# Patient Record
Sex: Male | Born: 1935 | Race: White | Hispanic: No | Smoking: Never smoker
Health system: Southern US, Community
[De-identification: ages and names within clinical notes are randomized; demographics above are authoritative.]

## PROBLEM LIST (undated history)

## (undated) DIAGNOSIS — R0609 Other forms of dyspnea: Secondary | ICD-10-CM

## (undated) DIAGNOSIS — K621 Rectal polyp: Secondary | ICD-10-CM

## (undated) DIAGNOSIS — R739 Hyperglycemia, unspecified: Secondary | ICD-10-CM

## (undated) DIAGNOSIS — G4730 Sleep apnea, unspecified: Secondary | ICD-10-CM

## (undated) DIAGNOSIS — R350 Frequency of micturition: Secondary | ICD-10-CM

## (undated) DIAGNOSIS — R5383 Other fatigue: Secondary | ICD-10-CM

## (undated) DIAGNOSIS — R269 Unspecified abnormalities of gait and mobility: Secondary | ICD-10-CM

## (undated) DIAGNOSIS — G609 Hereditary and idiopathic neuropathy, unspecified: Secondary | ICD-10-CM

## (undated) DIAGNOSIS — R0989 Other specified symptoms and signs involving the circulatory and respiratory systems: Secondary | ICD-10-CM

## (undated) DIAGNOSIS — Z7901 Long term (current) use of anticoagulants: Secondary | ICD-10-CM

## (undated) DIAGNOSIS — L723 Sebaceous cyst: Secondary | ICD-10-CM

## (undated) DIAGNOSIS — K589 Irritable bowel syndrome without diarrhea: Secondary | ICD-10-CM

## (undated) DIAGNOSIS — R011 Cardiac murmur, unspecified: Secondary | ICD-10-CM

## (undated) DIAGNOSIS — G252 Other specified forms of tremor: Secondary | ICD-10-CM

## (undated) DIAGNOSIS — K449 Diaphragmatic hernia without obstruction or gangrene: Secondary | ICD-10-CM

## (undated) DIAGNOSIS — R197 Diarrhea, unspecified: Secondary | ICD-10-CM

## (undated) DIAGNOSIS — K5900 Constipation, unspecified: Secondary | ICD-10-CM

## (undated) DIAGNOSIS — R002 Palpitations: Secondary | ICD-10-CM

## (undated) DIAGNOSIS — R5381 Other malaise: Secondary | ICD-10-CM

## (undated) DIAGNOSIS — N400 Enlarged prostate without lower urinary tract symptoms: Secondary | ICD-10-CM

## (undated) DIAGNOSIS — R7989 Other specified abnormal findings of blood chemistry: Secondary | ICD-10-CM

## (undated) DIAGNOSIS — I10 Essential (primary) hypertension: Secondary | ICD-10-CM

## (undated) DIAGNOSIS — G8100 Flaccid hemiplegia affecting unspecified side: Secondary | ICD-10-CM

## (undated) DIAGNOSIS — G459 Transient cerebral ischemic attack, unspecified: Secondary | ICD-10-CM

## (undated) DIAGNOSIS — R609 Edema, unspecified: Secondary | ICD-10-CM

## (undated) DIAGNOSIS — K210 Gastro-esophageal reflux disease with esophagitis: Secondary | ICD-10-CM

## (undated) DIAGNOSIS — R42 Dizziness and giddiness: Secondary | ICD-10-CM

## (undated) DIAGNOSIS — M25569 Pain in unspecified knee: Secondary | ICD-10-CM

## (undated) DIAGNOSIS — G250 Essential tremor: Secondary | ICD-10-CM

## (undated) DIAGNOSIS — G44209 Tension-type headache, unspecified, not intractable: Secondary | ICD-10-CM

## (undated) DIAGNOSIS — G4700 Insomnia, unspecified: Secondary | ICD-10-CM

## (undated) DIAGNOSIS — E538 Deficiency of other specified B group vitamins: Secondary | ICD-10-CM

## (undated) DIAGNOSIS — K620 Anal polyp: Secondary | ICD-10-CM

## (undated) DIAGNOSIS — J342 Deviated nasal septum: Secondary | ICD-10-CM

## (undated) DIAGNOSIS — N183 Chronic kidney disease, stage 3 (moderate): Principal | ICD-10-CM

## (undated) DIAGNOSIS — I4891 Unspecified atrial fibrillation: Secondary | ICD-10-CM

## (undated) HISTORY — DX: Sleep apnea, unspecified: G47.30

## (undated) HISTORY — DX: Pain in unspecified knee: M25.569

## (undated) HISTORY — DX: Rectal polyp: K62.1

## (undated) HISTORY — DX: Hereditary and idiopathic neuropathy, unspecified: G60.9

## (undated) HISTORY — DX: Benign prostatic hyperplasia without lower urinary tract symptoms: N40.0

## (undated) HISTORY — DX: Frequency of micturition: R35.0

## (undated) HISTORY — DX: Essential tremor: G25.0

## (undated) HISTORY — DX: Transient cerebral ischemic attack, unspecified: G45.9

## (undated) HISTORY — DX: Chronic kidney disease, stage 3 (moderate): N18.3

## (undated) HISTORY — DX: Gastro-esophageal reflux disease with esophagitis: K21.0

## (undated) HISTORY — DX: Other specified forms of tremor: G25.2

## (undated) HISTORY — DX: Other forms of dyspnea: R06.09

## (undated) HISTORY — DX: Long term (current) use of anticoagulants: Z79.01

## (undated) HISTORY — DX: Other specified symptoms and signs involving the circulatory and respiratory systems: R09.89

## (undated) HISTORY — DX: Deviated nasal septum: J34.2

## (undated) HISTORY — DX: Other fatigue: R53.83

## (undated) HISTORY — DX: Insomnia, unspecified: G47.00

## (undated) HISTORY — DX: Sebaceous cyst: L72.3

## (undated) HISTORY — DX: Anal polyp: K62.0

## (undated) HISTORY — DX: Dizziness and giddiness: R42

## (undated) HISTORY — DX: Other malaise: R53.81

## (undated) HISTORY — DX: Irritable bowel syndrome without diarrhea: K58.9

## (undated) HISTORY — DX: Other specified abnormal findings of blood chemistry: R79.89

## (undated) HISTORY — DX: Deficiency of other specified B group vitamins: E53.8

## (undated) HISTORY — DX: Unspecified atrial fibrillation: I48.91

## (undated) HISTORY — DX: Edema, unspecified: R60.9

## (undated) HISTORY — DX: Constipation, unspecified: K59.00

## (undated) HISTORY — DX: Palpitations: R00.2

## (undated) HISTORY — DX: Cardiac murmur, unspecified: R01.1

## (undated) HISTORY — DX: Unspecified abnormalities of gait and mobility: R26.9

## (undated) HISTORY — DX: Flaccid hemiplegia affecting unspecified side: G81.00

## (undated) HISTORY — DX: Diarrhea, unspecified: R19.7

## (undated) HISTORY — DX: Essential (primary) hypertension: I10

## (undated) HISTORY — DX: Diaphragmatic hernia without obstruction or gangrene: K44.9

## (undated) HISTORY — DX: Hyperglycemia, unspecified: R73.9

## (undated) HISTORY — DX: Tension-type headache, unspecified, not intractable: G44.209

---

## 1978-09-05 DIAGNOSIS — K620 Anal polyp: Secondary | ICD-10-CM

## 1978-09-05 HISTORY — DX: Anal polyp: K62.0

## 1999-06-30 ENCOUNTER — Encounter: Payer: Self-pay | Admitting: Gastroenterology

## 1999-06-30 ENCOUNTER — Ambulatory Visit (HOSPITAL_COMMUNITY): Admission: RE | Admit: 1999-06-30 | Discharge: 1999-06-30 | Payer: Self-pay | Admitting: Gastroenterology

## 2000-09-05 DIAGNOSIS — G459 Transient cerebral ischemic attack, unspecified: Secondary | ICD-10-CM

## 2000-09-05 DIAGNOSIS — R011 Cardiac murmur, unspecified: Secondary | ICD-10-CM

## 2000-09-05 DIAGNOSIS — N400 Enlarged prostate without lower urinary tract symptoms: Secondary | ICD-10-CM

## 2000-09-05 HISTORY — DX: Cardiac murmur, unspecified: R01.1

## 2000-09-05 HISTORY — DX: Benign prostatic hyperplasia without lower urinary tract symptoms: N40.0

## 2000-09-05 HISTORY — DX: Transient cerebral ischemic attack, unspecified: G45.9

## 2001-03-17 ENCOUNTER — Encounter: Payer: Self-pay | Admitting: Internal Medicine

## 2001-03-17 ENCOUNTER — Inpatient Hospital Stay (HOSPITAL_COMMUNITY): Admission: EM | Admit: 2001-03-17 | Discharge: 2001-03-19 | Payer: Self-pay | Admitting: Emergency Medicine

## 2001-03-18 ENCOUNTER — Encounter: Payer: Self-pay | Admitting: Internal Medicine

## 2001-03-27 ENCOUNTER — Ambulatory Visit: Admission: RE | Admit: 2001-03-27 | Discharge: 2001-03-27 | Payer: Self-pay | Admitting: Internal Medicine

## 2004-07-08 ENCOUNTER — Ambulatory Visit: Payer: Self-pay | Admitting: Internal Medicine

## 2004-10-22 ENCOUNTER — Ambulatory Visit: Payer: Self-pay | Admitting: Internal Medicine

## 2004-10-26 ENCOUNTER — Encounter: Admission: RE | Admit: 2004-10-26 | Discharge: 2004-10-26 | Payer: Self-pay | Admitting: Internal Medicine

## 2004-11-02 ENCOUNTER — Ambulatory Visit: Payer: Self-pay | Admitting: Internal Medicine

## 2004-11-09 ENCOUNTER — Inpatient Hospital Stay (HOSPITAL_COMMUNITY): Admission: AD | Admit: 2004-11-09 | Discharge: 2004-11-10 | Payer: Self-pay | Admitting: Internal Medicine

## 2004-11-09 ENCOUNTER — Ambulatory Visit: Payer: Self-pay | Admitting: Internal Medicine

## 2004-11-15 ENCOUNTER — Ambulatory Visit: Payer: Self-pay | Admitting: Internal Medicine

## 2004-12-14 ENCOUNTER — Ambulatory Visit: Payer: Self-pay | Admitting: Internal Medicine

## 2004-12-27 ENCOUNTER — Ambulatory Visit: Payer: Self-pay | Admitting: Internal Medicine

## 2005-05-17 ENCOUNTER — Ambulatory Visit: Payer: Self-pay | Admitting: Gastroenterology

## 2005-08-22 ENCOUNTER — Ambulatory Visit: Payer: Self-pay | Admitting: Internal Medicine

## 2005-09-05 DIAGNOSIS — G252 Other specified forms of tremor: Secondary | ICD-10-CM

## 2005-09-05 DIAGNOSIS — I10 Essential (primary) hypertension: Secondary | ICD-10-CM

## 2005-09-05 DIAGNOSIS — K449 Diaphragmatic hernia without obstruction or gangrene: Secondary | ICD-10-CM

## 2005-09-05 DIAGNOSIS — R5381 Other malaise: Secondary | ICD-10-CM

## 2005-09-05 DIAGNOSIS — G250 Essential tremor: Secondary | ICD-10-CM

## 2005-09-05 DIAGNOSIS — R002 Palpitations: Secondary | ICD-10-CM

## 2005-09-05 DIAGNOSIS — K2100 Gastro-esophageal reflux disease with esophagitis, without bleeding: Secondary | ICD-10-CM

## 2005-09-05 HISTORY — DX: Other malaise: R53.81

## 2005-09-05 HISTORY — DX: Palpitations: R00.2

## 2005-09-05 HISTORY — DX: Essential tremor: G25.0

## 2005-09-05 HISTORY — DX: Diaphragmatic hernia without obstruction or gangrene: K44.9

## 2005-09-05 HISTORY — DX: Essential (primary) hypertension: I10

## 2005-09-05 HISTORY — DX: Other specified forms of tremor: G25.2

## 2005-09-05 HISTORY — DX: Gastro-esophageal reflux disease with esophagitis, without bleeding: K21.00

## 2006-09-05 DIAGNOSIS — L723 Sebaceous cyst: Secondary | ICD-10-CM

## 2006-09-05 DIAGNOSIS — E538 Deficiency of other specified B group vitamins: Secondary | ICD-10-CM

## 2006-09-05 HISTORY — DX: Deficiency of other specified B group vitamins: E53.8

## 2006-09-05 HISTORY — DX: Sebaceous cyst: L72.3

## 2007-07-30 ENCOUNTER — Encounter (INDEPENDENT_AMBULATORY_CARE_PROVIDER_SITE_OTHER): Payer: Self-pay | Admitting: General Surgery

## 2007-07-30 ENCOUNTER — Ambulatory Visit (HOSPITAL_COMMUNITY): Admission: RE | Admit: 2007-07-30 | Discharge: 2007-07-30 | Payer: Self-pay | Admitting: General Surgery

## 2007-09-06 DIAGNOSIS — G44209 Tension-type headache, unspecified, not intractable: Secondary | ICD-10-CM

## 2007-09-06 DIAGNOSIS — R42 Dizziness and giddiness: Secondary | ICD-10-CM

## 2007-09-06 HISTORY — DX: Dizziness and giddiness: R42

## 2007-09-06 HISTORY — DX: Tension-type headache, unspecified, not intractable: G44.209

## 2008-09-05 DIAGNOSIS — R609 Edema, unspecified: Secondary | ICD-10-CM

## 2008-09-05 HISTORY — DX: Edema, unspecified: R60.9

## 2009-02-09 DIAGNOSIS — R7989 Other specified abnormal findings of blood chemistry: Secondary | ICD-10-CM

## 2009-02-09 HISTORY — DX: Other specified abnormal findings of blood chemistry: R79.89

## 2009-11-03 DIAGNOSIS — R0609 Other forms of dyspnea: Secondary | ICD-10-CM

## 2009-11-03 DIAGNOSIS — I4891 Unspecified atrial fibrillation: Secondary | ICD-10-CM

## 2009-11-03 DIAGNOSIS — J342 Deviated nasal septum: Secondary | ICD-10-CM

## 2009-11-03 DIAGNOSIS — R0989 Other specified symptoms and signs involving the circulatory and respiratory systems: Secondary | ICD-10-CM

## 2009-11-03 DIAGNOSIS — Z7901 Long term (current) use of anticoagulants: Secondary | ICD-10-CM

## 2009-11-03 HISTORY — DX: Deviated nasal septum: J34.2

## 2009-11-03 HISTORY — DX: Long term (current) use of anticoagulants: Z79.01

## 2009-11-03 HISTORY — DX: Unspecified atrial fibrillation: I48.91

## 2009-11-03 HISTORY — DX: Other specified symptoms and signs involving the circulatory and respiratory systems: R06.09

## 2009-11-03 HISTORY — DX: Other specified symptoms and signs involving the circulatory and respiratory systems: R09.89

## 2010-02-03 DIAGNOSIS — G4700 Insomnia, unspecified: Secondary | ICD-10-CM

## 2010-02-03 DIAGNOSIS — G609 Hereditary and idiopathic neuropathy, unspecified: Secondary | ICD-10-CM

## 2010-02-03 DIAGNOSIS — R197 Diarrhea, unspecified: Secondary | ICD-10-CM

## 2010-02-03 DIAGNOSIS — M25569 Pain in unspecified knee: Secondary | ICD-10-CM

## 2010-02-03 DIAGNOSIS — R269 Unspecified abnormalities of gait and mobility: Secondary | ICD-10-CM

## 2010-02-03 HISTORY — DX: Unspecified abnormalities of gait and mobility: R26.9

## 2010-02-03 HISTORY — DX: Pain in unspecified knee: M25.569

## 2010-02-03 HISTORY — DX: Hereditary and idiopathic neuropathy, unspecified: G60.9

## 2010-02-03 HISTORY — DX: Insomnia, unspecified: G47.00

## 2010-02-03 HISTORY — DX: Diarrhea, unspecified: R19.7

## 2010-05-26 ENCOUNTER — Encounter: Admission: RE | Admit: 2010-05-26 | Discharge: 2010-05-26 | Payer: Self-pay | Admitting: Gastroenterology

## 2010-12-08 ENCOUNTER — Ambulatory Visit
Admission: RE | Admit: 2010-12-08 | Discharge: 2010-12-08 | Disposition: A | Discharge status: Home / Self Care | Payer: Medicare Other | Source: Ambulatory Visit | Attending: Internal Medicine | Admitting: Internal Medicine

## 2010-12-08 ENCOUNTER — Other Ambulatory Visit: Payer: Self-pay | Admitting: Internal Medicine

## 2010-12-08 DIAGNOSIS — R0989 Other specified symptoms and signs involving the circulatory and respiratory systems: Secondary | ICD-10-CM

## 2010-12-08 DIAGNOSIS — R05 Cough: Secondary | ICD-10-CM

## 2011-01-18 NOTE — Op Note (Signed)
NAME:  KEYVIN, EAVEY NO.:  [id number]   MEDICAL RECORD NO.:  [id number]          PATIENT TYPE:  AMB   LOCATION:  SDS                          FACILITY:  [facility]   PHYSICIAN:  Merri Ray. Grandville Silos, M.D.DATE OF BIRTH:  1916/06/20   DATE OF PROCEDURE:  07/30/2007  DATE OF DISCHARGE:                               OPERATIVE REPORT   PREOPERATIVE DIAGNOSIS:  Cyst, left axilla.   POSTOPERATIVE DIAGNOSIS:  Cyst, left axilla.   PROCEDURES:  Excision of cyst in left axilla.   SURGEON:  Merri Ray. Grandville Silos, M.D.   ANESTHESIA:  MAC with local.   HISTORY OF PRESENT ILLNESS:  Mr. Amjad is an 77 year old gentleman who I  evaluated in the office for a cyst in his left axilla.  He developed an  infection there recently, and was treated with antibiotics by Dr. Nyoka Cowden.  He has had some waxy discharge, but that has improved.  It had shrunk  significantly in size.  He presents today for elective excision, to  prevent recurrence.  This is likely an epidermal inclusion cyst.   PROCEDURE IN DETAIL:  Informed consent was obtained.  The patient was  identified in the preoperative  holding area.  His site was marked.  He  received intravenous antibiotics.  He was brought to the operating room  and MAC anesthesia was administered by the anesthesia staff.  His left  axilla was prepped and draped in sterile fashion, with a one-to-one  mixture of 1% lidocaine plain and 0.25% Marcaine with epinephrine  injected around the cyst area.  An elliptical incision was made to  encompass the overlying skin sinus.  Subcutaneous tissues were dissected down the cyst itself; which was  circumferentially dissected without rupturing it.  Bovie cautery was  used for hemostasis.  The cyst was removed with the overlying skin and  sent to pathology.  The wound was copiously irrigated.  Meticulous  hemostasis was ensured.  Some additional local anesthetic was injected.  Subcutaneous tissues were then approximated  with interrupted 3-0 Vicryl  sutures and the skin was closed with running 4-0 Monocryl subcuticular  stitch.  Sponge, needle and instrument counts were correct.  Benzoin,  Steri-Strips and sterile dressings were applied.  The patient tolerated  the procedure well without apparent complication.  He was taken to the  recovery room in stable condition.      Merri Ray Grandville Silos, M.D.  Electronically Signed     BET/MEDQ  D:  07/30/2007  T:  07/30/2007  Job:  [id number]   cc:   Viviann Spare. Nyoka Cowden, M.D.

## 2011-01-21 NOTE — H&P (Signed)
Gary [name], Gary [name] NO.:  [id number]   MEDICAL RECORD NO.:  [id number]          PATIENT TYPE:  INP   LOCATION:  6732                         FACILITY:  [facility]   PHYSICIAN:  Gary Branch, MD [facility]    DATE OF BIRTH:  1915-11-24   DATE OF ADMISSION:  11/09/2004  DATE OF DISCHARGE:                                HISTORY & PHYSICAL   CHIEF COMPLAINT:  Felling worse.   PRIMARY CARE DOCTOR:  Lenis Mohl. Linna Darner, M.D.   HISTORY OF PRESENT ILLNESS:  [name] is an 77 year old gentleman who was  seen in the office nine days ago.  At that time, he complained of cough,  chest congestion and some shortness of breath.  He was found to have a lot  of rhonchi and wheezes in his chest and diagnosed with bronchitis and  bronchospasm.  He was prescribed Z-Pak, Mucinex and Hycotuss, as well as  albuterol.  He came today, stating that he initially got better, but now  feels worse.  He is coughing more.  He has more chest congestion.  He has  dyspnea on exertion.  He is feeling weak and he is shaking on his hands.  At the office, the x-ray showed possibly pneumonia and he has been admitted  for further care.   PAST MEDICAL HISTORY:  1.  BPH.  2.  History of TIA x 2 in the past.  3.  History of cholecystectomy.  4.  Hypertension.  5.  GERD.  6.  Osteoarthritis.   FAMILY HISTORY:  Father had diabetes.  Mother died at age 68 of old age.   SOCIAL HISTORY:  The patient has never smoked.  He drinks occasionally.  He  is a retired Chief Financial Officer.   REVIEW OF SYSTEMS:  He thinks he is running a low-grade fever.  He does not  have an appetite at all.  He does not complain of chest pain.  He has very  little sputum production.  He was prescribed Hycotuss during the last visit,  which helped with the cough, but it did cause him to have some difficulty  urinating one time.  He did not develop any overt bladder obstruction.  The  patient also has chronic headaches and he has been referred to  neurology  already as an outpatient.   MEDICATIONS:  1.  Darvocet p.r.n. headache.  2.  Neurontin 100 mg one p.o. t.i.d.  3.  Aciphex.  4.  Vasotec 5 mg p.o. daily.  5.  Flomax once a day.  6.  Aspirin.   ALLERGIES:  The patient has no known drug allergies.   PHYSICAL EXAMINATION:  GENERAL APPEARANCE:  The patient is alert, orient and  in no apparent distress.  VITAL SIGNS:  He is afebrile.  Pulse 64, blood pressure 140/70.  WEIGHT:  203 pounds.  HEENT:  The oropharynx is normal.  The nose is clear.  The sinuses are  nontender.  LUNGS:  He does have some rhonchi with cough and decreased breath sounds at  the bases.  He does not have any overt respiratory distress.  His O2  saturation on room air is 92%.  CARDIOVASCULAR:  Regular rate and rhythm.  He does have a soft systolic  murmur which was noticed previously.  EXTREMITIES:  No edema.  NEUROLOGIC:  Speech, gait and motor are intact.   LABORATORY AND X-RAYS:  Chest x-ray done in the office showed possible  infiltrates at both bases.  I do not have an old chest x-ray to compare  with, so it is hard to determine if he truly has an infiltrate.  His blood  sugar at the office was 131.  Again, his pulse oximetry was 92%.   ASSESSMENT AND PLAN:  [name] is an 77 year old gentleman who is not  responding well to outpatient treatment of his respiratory illness.  He may  be developing pneumonia.  Because of his age, I think it is in his best  interest to be admitted to the hospital where he can be closely monitored.  I will start him on IV Avelox after blood cultures are done.  Will continue  with his routine medicines, as well as Xopenex nebulizations t.i.d.  Will do  DVT prophylaxis with Lovenox.  The films from today were given to the  patient so they can be read at the hospital by one of our radiologists.      JEP/MEDQ  D:  11/09/2004  T:  11/09/2004  Job:  [id number]

## 2011-01-21 NOTE — Discharge Summary (Signed)
[facility]  Patient:    Gary [name], Gary [name]                      MRN: [id number] Adm. Date:  [id number] Disc. Date: [id number] Attending:  Cassandria Anger CC:         Darrick Penna. Linna Darner, M.D. [facility]   Discharge Summary  DISCHARGE DIAGNOSES: 1. Transient ischemic attack like symptoms which have resolved. 2. Heart murmur which according to the patient is not new.  It sounds like    diastolic murmur on auscultation.  The patient is to have a 2-D    echocardiogram done as an outpatient. 3. History of hypertension.  There has been no change in his medications, and    he is to continue on them. 4. History of gastroesophageal reflux disease. 5. Dyslipidemia, briefly discussed exercise and cooking with olive oil and    trying to raise the HDL which the patient says they already do.  Therefore,    I recommended that he discuss potential use of one of the lipid medications    for raising the HDL with his primary care physician.  DISCHARGE MEDICATIONS: 1. Prilosec 20 mg p.o. q.d. 2. Enalapril 10 mg p.o. q.d. 3. Celebrex 100 mg p.o. twice a day p.r.n. 4. Plavix 75 mg p.o. q.d. 5. Baby aspirin 81 mg p.o. q.d. 6. The patient is also currently on ginkgo biloba study, and he is either    taking the active or the placebo form.  DISCHARGE DIET:  Low salt diet.  ACTIVITY:  As tolerated.  DISCHARGE FOLLOW-UP:  He is to set up a follow-up appointment with Dr. Jodi Mourning in approximately two weeks.  DISCHARGE INSTRUCTIONS:  The patient is to have a carotid ultrasound and 2-D echocardiogram done as an outpatient at the [facility] office.  HOSPITAL COURSE:  This 77 year old gentleman with a previous history of TIA like symptoms approximately 25 years ago and a history of hypertension was admitted on the 13th by Dr. Alain Marion with complaints of acute memory loss that happened twice early on the day of admission.  The patient denies any other symptoms.   At that time, this episode of ______  which lasted about 2-3 hours and had technically resolved by the time of his presentation to the ER. He did have a slight headache, 2/10 in intensity on the left side over his temple.  the patient had no slurred speech, no bowel or bladder incontinence, no weakness.  The patients initial work-up consisted of a CT scan of the head which was technically negative.  His laboratory work-up was also essentially unremarkable.  He had a TSH done that was 1.82.  Vitamin B12 was in the normal range at 301.  His sed rate was 5.  His white count was also normal with an H&H of 14 and 41.1 with a total white count of 5.2, platelet count of 167,000. Electrolytes consisting of a BMP was also fairly unremarkable.  He also had a fasting lipid panel which came back fairly good except for an HDL of 37, total cholesterol of 131, triglyceride 91, LDL 76.  He underwent an MRI and MRA of the brain, and that was essentially unremarkable except for an empty sellar. He had no acute ischemic changes.  He had good flow voids in the major vessels at the cranial skull base also.  So, technically, it was a fairly unremarkable MRI except for diffuse atrophy and some mild small vessel  type disease changes supratentorialy.  He did have some suggestion of mastoiditis and mild sinusitis.  The MRA portion of the brain showed no occlusion, stenosis, dissections, or aneurysms.  The patient is being discharged to home in stable condition.  He is to have his scheduled outpatient studies as previously mentioned and also follow up with Dr. Linna Darner, as mentioned above. DD:  03/19/01 TD:  03/19/01 Job: [id number]

## 2011-01-21 NOTE — Discharge Summary (Signed)
NAMEJEN, CADOTTE NO.:  [id number]   MEDICAL RECORD NO.:  [id number]          PATIENT TYPE:  INP   LOCATION:  6732                         FACILITY:  [facility]   PHYSICIAN:  Colon Branch, MD [facility]    DATE OF BIRTH:  1916/01/16   DATE OF ADMISSION:  11/09/2004  DATE OF DISCHARGE:  11/10/2004                                 DISCHARGE SUMMARY   DISCHARGE DIAGNOSES:  1.  Cough.  2.  Possible bilateral pneumonia.   HISTORY OF PRESENT ILLNESS:  Mr. Stockham is an 77 year old white male who  presented with cough.  He states that he has had this cough for a while and  it got better initially and now it seems to be getting worse.  He describes  mostly cough at night which is nonproductive and keeps him awake.   PAST MEDICAL HISTORY:  1.  History of prostatism.  2.  Transient ischemic attack x2.  3.  Gallbladder disease.  4.  Hypertension.  5.  BPH.   HOSPITAL COURSE:  Problem 1.  ID.  The patient was admitted with a  questionable bilateral pneumonia on office chest x-ray.  The patient is  afebrile and his white count is normal.  The patient has been started on  Avelox and will have him complete a full 10-day course.  A follow-up chest x-  ray is still pending at the time of this dictation.   Problem 2.  Pulmonary.  The patient presented with some dyspnea, but no  hypoxemia.  No history of tobacco use or COPD.  Currently saturations are  95% on room air.  He does possibly have bilateral pneumonia which may be the  etiology of his shortness of breath.   DISCHARGE LABORATORY DATA:  CBC, CMET, coags, and blood cultures were all  negative.   DISCHARGE MEDICATIONS:  We will have him resume his home medications  including:  1.  Neurontin 100 mg t.i.d.  2.  Aciphex 20 mg daily.  3.  Vasotec 5 mg daily.  4.  Flomax 0.4 mg daily.  5.  Aspirin 81 mg daily.  6.  Avelox 400 mg last dose November 17, 2004.  7.  Tussionex 5 mg q.h.s. p.r.n. cough.   FOLLOW UP:  Follow up with  Dr. Larose Kells on November 15, 2004, at 11:30 a.m.      LC/MEDQ  D:  11/10/2004  T:  11/10/2004  Job:  [id number]

## 2011-01-21 NOTE — H&P (Signed)
[facility]  Patient:    Gary [name], Gary [name]                      MRN: [id number] Adm. Date:  [id number] Attending:  Cassandria Anger CC:         Darrick Penna. Linna Darner, M.D. [facility]   History and Physical  DATE OF BIRTH:  [birth year]  CHIEF COMPLAINT:  Memory loss.  HISTORY OF PRESENT ILLNESS:  The patient is an 77 year old white male with acute memory loss that happened twice earlier today.  The episodes were lasting for about 2-3 hours and resolved by now.  He also had a slight headache, 2/10 in intensity.  It was localized more on the left side over the temple.  He presented to the ER for evaluation.  PAST MEDICAL HISTORY:  TIA 25 years ago, hypertension, cholecystectomy, GERD, bilateral knee osteoarthritis.  MEDICATIONS: 1. Prilosec 20 mg a day. 2. Enalapril 10 mg a day. 3. Celebrex 100 mg one twice a day p.r.n. 4. Tylenol p.r.n., received aspirin in the ER. 5. He is also presently on ginkgo biloba study, taking either active herb or    placebo.  SOCIAL HISTORY:  He is married.  Never smokes, drinks one scotch a day.  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  FAMILY HISTORY:  Mother died at age 61 of old age, father died at the age of 61 with complications of diabetes.  ALLERGIES:  No known drug allergies.  REVIEW OF SYSTEMS:  Had normal cholesterol, denies syncope, chest pain.  No previous episodes except for 25 years ago, checked Hemoccult stools a couple of months ago which were normal, the rest is negative.  PHYSICAL EXAMINATION:  Blood pressure 168/67, pulse 58, respirations 18, temperature 96.9.  He is in no acute distress.  Alert and oriented and cooperative.  HEENT:  Moist mucosa.  NECK:  Supple.  There was a right-sided carotid bruit, no thyromegaly.  LUNGS:  Clear to auscultation and percussion.  HEART:  S1, S2, no murmur, no gallop.  ABDOMEN:  Soft, nontender, no organomegaly, no mass felt.  EXTREMITY:  Lower  extremities without edema.  There is no pronator drift.  SKIN:  Multiple seborrheic keratosis lesions.  NEUROLOGIC:  Cranial nerves II-XII nonfocal.  Muscle strength normal.  LABORATORY DATA:  CBG 112, EKG normal sinus rhythm with first degree AV block. BMET normal, CBC normal, CT of the brain negative.  ASSESSMENT AND PLAN: 1. Transient ischemic attack.  Initial workup including MRI with MRA.    Cardiac, echocardiogram and lab work.  Started aspirin, Plavix, consider    statins, vitamin B complex IV fluids. 2. Right carotid bruit.  Obtained carotid Doppler ultrasound. 3. Memory loss due to problem #1. 4. Hypertension, continue treatment. 5. Gastroesophageal reflux disease. DD:  03/17/01 TD:  03/18/01 Job: [id number]

## 2011-05-16 DIAGNOSIS — G8100 Flaccid hemiplegia affecting unspecified side: Secondary | ICD-10-CM

## 2011-05-16 HISTORY — DX: Flaccid hemiplegia affecting unspecified side: G81.00

## 2011-06-14 LAB — BASIC METABOLIC PANEL
CO2: 30
Chloride: 107
GFR calc Af Amer: >60
Sodium: 143

## 2011-06-14 LAB — CBC
Hemoglobin: 13.2
MCHC: 34.2
MCV: 92.4
RBC: 4.17 — ABNORMAL LOW

## 2011-09-28 DIAGNOSIS — I4891 Unspecified atrial fibrillation: Secondary | ICD-10-CM | POA: Diagnosis not present

## 2011-09-28 DIAGNOSIS — Z7901 Long term (current) use of anticoagulants: Secondary | ICD-10-CM | POA: Diagnosis not present

## 2011-11-03 DIAGNOSIS — I4891 Unspecified atrial fibrillation: Secondary | ICD-10-CM | POA: Diagnosis not present

## 2011-11-03 DIAGNOSIS — R7989 Other specified abnormal findings of blood chemistry: Secondary | ICD-10-CM | POA: Diagnosis not present

## 2011-11-03 DIAGNOSIS — R609 Edema, unspecified: Secondary | ICD-10-CM | POA: Diagnosis not present

## 2011-11-03 DIAGNOSIS — R5381 Other malaise: Secondary | ICD-10-CM | POA: Diagnosis not present

## 2011-11-03 DIAGNOSIS — I10 Essential (primary) hypertension: Secondary | ICD-10-CM | POA: Diagnosis not present

## 2011-11-14 DIAGNOSIS — R7989 Other specified abnormal findings of blood chemistry: Secondary | ICD-10-CM | POA: Diagnosis not present

## 2011-11-14 DIAGNOSIS — K589 Irritable bowel syndrome without diarrhea: Secondary | ICD-10-CM

## 2011-11-14 DIAGNOSIS — I4891 Unspecified atrial fibrillation: Secondary | ICD-10-CM | POA: Diagnosis not present

## 2011-11-14 DIAGNOSIS — R5381 Other malaise: Secondary | ICD-10-CM | POA: Diagnosis not present

## 2011-11-14 DIAGNOSIS — E538 Deficiency of other specified B group vitamins: Secondary | ICD-10-CM | POA: Diagnosis not present

## 2011-11-14 DIAGNOSIS — I10 Essential (primary) hypertension: Secondary | ICD-10-CM | POA: Diagnosis not present

## 2011-11-14 HISTORY — DX: Irritable bowel syndrome, unspecified: K58.9

## 2011-12-14 DIAGNOSIS — E538 Deficiency of other specified B group vitamins: Secondary | ICD-10-CM | POA: Diagnosis not present

## 2011-12-14 DIAGNOSIS — Z7901 Long term (current) use of anticoagulants: Secondary | ICD-10-CM | POA: Diagnosis not present

## 2012-01-25 DIAGNOSIS — Z7901 Long term (current) use of anticoagulants: Secondary | ICD-10-CM | POA: Diagnosis not present

## 2012-01-25 DIAGNOSIS — I4891 Unspecified atrial fibrillation: Secondary | ICD-10-CM | POA: Diagnosis not present

## 2012-02-29 DIAGNOSIS — I4891 Unspecified atrial fibrillation: Secondary | ICD-10-CM | POA: Diagnosis not present

## 2012-02-29 DIAGNOSIS — Z7901 Long term (current) use of anticoagulants: Secondary | ICD-10-CM | POA: Diagnosis not present

## 2012-04-10 DIAGNOSIS — L723 Sebaceous cyst: Secondary | ICD-10-CM | POA: Diagnosis not present

## 2012-04-11 DIAGNOSIS — Z7901 Long term (current) use of anticoagulants: Secondary | ICD-10-CM | POA: Diagnosis not present

## 2012-04-11 DIAGNOSIS — L723 Sebaceous cyst: Secondary | ICD-10-CM | POA: Diagnosis not present

## 2012-04-11 DIAGNOSIS — I4891 Unspecified atrial fibrillation: Secondary | ICD-10-CM | POA: Diagnosis not present

## 2012-05-02 DIAGNOSIS — I4891 Unspecified atrial fibrillation: Secondary | ICD-10-CM | POA: Diagnosis not present

## 2012-05-02 DIAGNOSIS — Z7901 Long term (current) use of anticoagulants: Secondary | ICD-10-CM | POA: Diagnosis not present

## 2012-05-22 DIAGNOSIS — R7989 Other specified abnormal findings of blood chemistry: Secondary | ICD-10-CM | POA: Diagnosis not present

## 2012-05-22 DIAGNOSIS — I4891 Unspecified atrial fibrillation: Secondary | ICD-10-CM | POA: Diagnosis not present

## 2012-05-22 DIAGNOSIS — E538 Deficiency of other specified B group vitamins: Secondary | ICD-10-CM | POA: Diagnosis not present

## 2012-05-22 DIAGNOSIS — I10 Essential (primary) hypertension: Secondary | ICD-10-CM | POA: Diagnosis not present

## 2012-06-04 DIAGNOSIS — R197 Diarrhea, unspecified: Secondary | ICD-10-CM | POA: Diagnosis not present

## 2012-06-04 DIAGNOSIS — R7989 Other specified abnormal findings of blood chemistry: Secondary | ICD-10-CM | POA: Diagnosis not present

## 2012-06-04 DIAGNOSIS — N400 Enlarged prostate without lower urinary tract symptoms: Secondary | ICD-10-CM | POA: Diagnosis not present

## 2012-06-04 DIAGNOSIS — R011 Cardiac murmur, unspecified: Secondary | ICD-10-CM | POA: Diagnosis not present

## 2012-06-04 DIAGNOSIS — G609 Hereditary and idiopathic neuropathy, unspecified: Secondary | ICD-10-CM | POA: Diagnosis not present

## 2012-06-04 DIAGNOSIS — I4891 Unspecified atrial fibrillation: Secondary | ICD-10-CM | POA: Diagnosis not present

## 2012-06-04 DIAGNOSIS — I10 Essential (primary) hypertension: Secondary | ICD-10-CM | POA: Diagnosis not present

## 2012-06-19 DIAGNOSIS — Z23 Encounter for immunization: Secondary | ICD-10-CM | POA: Diagnosis not present

## 2012-07-03 ENCOUNTER — Other Ambulatory Visit: Payer: Self-pay | Admitting: Geriatric Medicine

## 2012-07-03 ENCOUNTER — Ambulatory Visit
Admission: RE | Admit: 2012-07-03 | Discharge: 2012-07-03 | Disposition: A | Discharge status: Home / Self Care | Payer: Medicare Other | Source: Ambulatory Visit | Attending: Geriatric Medicine | Admitting: Geriatric Medicine

## 2012-07-03 DIAGNOSIS — R109 Unspecified abdominal pain: Secondary | ICD-10-CM

## 2012-07-04 DIAGNOSIS — Z7901 Long term (current) use of anticoagulants: Secondary | ICD-10-CM | POA: Diagnosis not present

## 2012-07-04 DIAGNOSIS — K5900 Constipation, unspecified: Secondary | ICD-10-CM

## 2012-07-04 DIAGNOSIS — I4891 Unspecified atrial fibrillation: Secondary | ICD-10-CM | POA: Diagnosis not present

## 2012-07-04 HISTORY — DX: Constipation, unspecified: K59.00

## 2012-07-11 DIAGNOSIS — I4891 Unspecified atrial fibrillation: Secondary | ICD-10-CM | POA: Diagnosis not present

## 2012-07-11 DIAGNOSIS — Z7901 Long term (current) use of anticoagulants: Secondary | ICD-10-CM | POA: Diagnosis not present

## 2012-07-25 DIAGNOSIS — I4891 Unspecified atrial fibrillation: Secondary | ICD-10-CM | POA: Diagnosis not present

## 2012-07-25 DIAGNOSIS — Z7901 Long term (current) use of anticoagulants: Secondary | ICD-10-CM | POA: Diagnosis not present

## 2012-08-22 DIAGNOSIS — I4891 Unspecified atrial fibrillation: Secondary | ICD-10-CM | POA: Diagnosis not present

## 2012-08-22 DIAGNOSIS — Z7901 Long term (current) use of anticoagulants: Secondary | ICD-10-CM | POA: Diagnosis not present

## 2012-09-25 DIAGNOSIS — R002 Palpitations: Secondary | ICD-10-CM | POA: Diagnosis not present

## 2012-09-25 DIAGNOSIS — Z7901 Long term (current) use of anticoagulants: Secondary | ICD-10-CM | POA: Diagnosis not present

## 2012-09-25 DIAGNOSIS — R7989 Other specified abnormal findings of blood chemistry: Secondary | ICD-10-CM | POA: Diagnosis not present

## 2012-09-25 DIAGNOSIS — I10 Essential (primary) hypertension: Secondary | ICD-10-CM | POA: Diagnosis not present

## 2012-10-01 DIAGNOSIS — R5381 Other malaise: Secondary | ICD-10-CM | POA: Diagnosis not present

## 2012-10-01 DIAGNOSIS — R350 Frequency of micturition: Secondary | ICD-10-CM

## 2012-10-01 DIAGNOSIS — I4891 Unspecified atrial fibrillation: Secondary | ICD-10-CM | POA: Diagnosis not present

## 2012-10-01 DIAGNOSIS — R5383 Other fatigue: Secondary | ICD-10-CM | POA: Diagnosis not present

## 2012-10-01 DIAGNOSIS — N182 Chronic kidney disease, stage 2 (mild): Secondary | ICD-10-CM | POA: Diagnosis not present

## 2012-10-01 DIAGNOSIS — R7989 Other specified abnormal findings of blood chemistry: Secondary | ICD-10-CM | POA: Diagnosis not present

## 2012-10-01 HISTORY — DX: Frequency of micturition: R35.0

## 2012-12-08 ENCOUNTER — Other Ambulatory Visit: Payer: Self-pay | Admitting: Geriatric Medicine

## 2012-12-27 DIAGNOSIS — Z79899 Other long term (current) drug therapy: Secondary | ICD-10-CM | POA: Diagnosis not present

## 2012-12-27 DIAGNOSIS — I10 Essential (primary) hypertension: Secondary | ICD-10-CM | POA: Diagnosis not present

## 2012-12-27 DIAGNOSIS — N400 Enlarged prostate without lower urinary tract symptoms: Secondary | ICD-10-CM | POA: Diagnosis not present

## 2012-12-27 DIAGNOSIS — R7989 Other specified abnormal findings of blood chemistry: Secondary | ICD-10-CM | POA: Diagnosis not present

## 2012-12-31 ENCOUNTER — Other Ambulatory Visit: Payer: Self-pay | Admitting: Geriatric Medicine

## 2013-01-02 ENCOUNTER — Encounter: Payer: Self-pay | Admitting: Geriatric Medicine

## 2013-01-02 ENCOUNTER — Non-Acute Institutional Stay: Payer: Medicare Other | Admitting: Geriatric Medicine

## 2013-01-02 VITALS — BP 112/64 | HR 64 | Ht 72.0 in | Wt 213.0 lb

## 2013-01-02 DIAGNOSIS — Z299 Encounter for prophylactic measures, unspecified: Secondary | ICD-10-CM

## 2013-01-02 DIAGNOSIS — I4891 Unspecified atrial fibrillation: Secondary | ICD-10-CM

## 2013-01-02 DIAGNOSIS — I10 Essential (primary) hypertension: Secondary | ICD-10-CM

## 2013-01-02 DIAGNOSIS — Z7901 Long term (current) use of anticoagulants: Secondary | ICD-10-CM | POA: Diagnosis not present

## 2013-01-02 DIAGNOSIS — I482 Chronic atrial fibrillation, unspecified: Secondary | ICD-10-CM | POA: Insufficient documentation

## 2013-01-02 DIAGNOSIS — R7309 Other abnormal glucose: Secondary | ICD-10-CM

## 2013-01-02 DIAGNOSIS — R739 Hyperglycemia, unspecified: Secondary | ICD-10-CM

## 2013-01-02 HISTORY — DX: Hyperglycemia, unspecified: R73.9

## 2013-01-03 NOTE — Progress Notes (Signed)
Patient ID: Gary [name], male   DOB: 1916/07/31, 79 y.o.   MRN: [id number] Chief Complaint  Patient presents with  . Medical Managment of Chronic Issues    A-fib, blood pressure, hyperglycemia    [id number] is a 79 y.o. male resident of [place],returns to clinic today for management of ongoing medical issues: AF, HTN, Hyperglycemia. Patient does not report any significant heart palpitations, and shortness of breath. He continues on Zaroxolyn for long-term anticoagulation. No signs of bleeding. Recent lab shows stable renal function. Blood pressure remains well-controlled. Patient has been followed for some mild hyperglycemia most recent fasting glucose within normal limits at 98. Overall patient reports he is feeling well, is very happy, his family is doing very well. He reports sleeping more and gaining some weight. He does experience some excess flatulence.  Allergies  Allergies  Allergen Reactions  . Plavix (Clopidogrel Bisulfate)     indigestion   Medications  Current outpatient prescriptions:Acetaminophen 500 MG coapsule, Take by mouth. Take 2 tablets once daily as needed, Disp: , Rfl: ;  cyanocobalamin (,VITAMIN B-12,) 1000 MCG/ML injection, INJECT 1ML SUBCUTANEOUSLY MONTHLY TO TREAT B12 DEFICIENCY, Disp: 0.1 mL, Rfl: 2;  doxazosin (CARDURA) 8 MG tablet, TAKE 1 TABLET BY MOUTH AT BEDTIME TO TREAT BENIGN PROSTATIC HYPERTROPHY, Disp: 90 tablet, Rfl: 1 furosemide (LASIX) 20 MG tablet, TAKE 1 TABLET BY MOUTH DAILY TO PREVENT FLUID ACCUMULATION, Disp: 90 tablet, Rfl: 2;  glycopyrrolate (ROBINUL) 1 MG tablet, Take 1 mg by mouth. Take one tablet daily as needed to treat loose stools, Disp: , Rfl: ;  metoprolol succinate (TOPROL-XL) 50 MG 24 hr tablet, Take 50 mg by mouth daily. Take one daily at bedtime to regulate heart rate and rhythm, Disp: , Rfl:  Multiple Vitamins-Minerals (CENTRUM SILVER PO), Take by mouth. Take one tablet once daily, Disp: , Rfl: ;   polyethylene glycol (MIRALAX / GLYCOLAX) packet, Take 17 g by mouth daily. Mix in 4-8oz beverage daily to relieve constipation. Reduce dose to every other day when stools become loose, Disp: , Rfl: ;  ranitidine (ZANTAC) 75 MG tablet, Take 75 mg by mouth. Take one daily to treat reflux, Disp: , Rfl:  Rivaroxaban (XARELTO) 20 MG TABS, Take by mouth daily. For anticoagulation to prevent stroke, Disp: , Rfl:   Data Reviewed       [id number], external 11/03/2011 PT 24.4, INR 2.11 Lipid: cholesterol 137, triglyceride 80, HDL 45, LDL 76 TSH 5.451 05/22/2012 CBC: Wbc 4.7, Rbc 4.49, Hgb 14.1, Hct 39.6, Platelet 149 CMP:  Sodium 142, Potassium 4.1, glucose 96, BUN 30, Creatinine 1.49 Lipid: cholesterol 142, triglyceride 94, HDL 39, LDL 84 09/25/2012 INR 1.97 CMP: Sodium 141, Potassium 3.9, glucose 96, BUN 30, Creatinine 1.50 TSH 5.214 HgbA1c 5.8 12/27/2012: Glucose 98, BUN 25, creatinine 1.59, sodium 144, potassium 3.9.  Total cholesterol 1:30, triglyceride 81, HDL 43, LDL 71.  A1c 5.6  PSA 1.69    Review of Systems   DATA OBTAINED: from patient GENERAL: Feels well. No fevers, fatigue, change in activity status. No change in appetite, weight   SKIN: No itch, rash or open wounds EYES: No eye pain, dryness or itching. No change in vision EARS: No earache, no change in hearing NOSE: No congestion, drainage or bleeding MOUTH/THROAT: No mouth or tooth pain. No difficulty chewing or swallowing. RESPIRATORY: No cough, wheezing, SOB CARDIAC: No chest pain, palpitations. No edema. GI: No abdominal pain. No N/V/D or constipation. No heartburn or reflux.  GU: No dysuria, frequency  or urgency. No nocturia or change in stream. No change in urine volume or character MUSCULOSKELETAL: No joint pain, swelling or stiffness. No back pain. No muscle ache, pain, weakness. Gait is steady. No recent falls.  NEUROLOGIC: No dizziness, fainting, headache, No change in mental status.  PSYCHIATRIC: No anxiety,  depression. Sleeps well.   Physical Exam Filed Vitals:   01/02/13 1109  BP: 112/64  Pulse: 64  Height: 6' (1.829 m)  Weight: 213 lb (96.616 kg)   Body mass index is 28.88 kg/(m^2).  GENERAL APPEARANCE: No acute distress, appropriately groomed, normal body habitus. Alert, pleasant, conversant. HEAD: Normocephalic, atraumatic EYES: Conjunctiva/lids clear. Pupils round, reactive.  EARS: External exam WNL, canals clear, TM WNL. Poor hearing NOSE: No deformity or discharge. MOUTH/THROAT: Lips w/o lesions. Oral mucosa, tongue moist, w/o lesion. Oropharynx w/o redness or lesions.  NECK: Supple, full ROM. No thyroid tenderness, enlargement or nodule LYMPHATICS: No head, neck or supraclavicular adenopathy RESPIRATORY: Breathing is even, unlabored. Lung sounds are clear and full.  CARDIOVASCULAR: Heart IRRR. No murmur or extra heart sounds   EDEMA: 1+ ankle edema bilateral GASTROINTESTINAL: Abdomen is soft, non-tender, not distended w/ normal bowel sounds.  MUSCULOSKELETAL:. Gait is steady, slow NEUROLOGIC: Oriented to time, place, person. Speech clear, no tremor.   PSYCHIATRIC: Mood and affect appropriate to situation  ASSESSMENT/PLAN  Atrial fibrillation Anticoagulant changed to Xarelto February 2014. No significant heart palpitations or shortness of breath. Mild ankle edema improves with elevation and walking.  Long term (current) use of anticoagulants Long-term anticoagulation for stroke risk reduction due to AF.  Warfarin changed to Xarelto February 2014. Patient tolerating medicine well, no sign of bleeding, renal function stable  Hyperglycemia Fasting glucose and hemoglobin A1c have been followed at intervals since 2010. Measures have remained within normal limits. Most recent fasting glucose 98, hemoglobin A1c 5.6   Follow up: 3 months, check CBC at that time  Glenisha Gundry T.Tricia O[place], NP-C 01/02/2013

## 2013-01-03 NOTE — Assessment & Plan Note (Addendum)
Long-term anticoagulation for stroke risk reduction due to AF.  Warfarin changed to Xarelto February 2014. Patient tolerating medicine well, no sign of bleeding, renal function stable

## 2013-01-03 NOTE — Assessment & Plan Note (Addendum)
Anticoagulant changed to Xarelto February 2014. No significant heart palpitations or shortness of breath. Mild ankle edema improves with elevation and walking.

## 2013-01-03 NOTE — Assessment & Plan Note (Signed)
Fasting glucose and hemoglobin A1c have been followed at intervals since 2010. Measures have remained within normal limits. Most recent fasting glucose 98, hemoglobin A1c 5.6

## 2013-02-01 ENCOUNTER — Encounter: Payer: Self-pay | Admitting: Internal Medicine

## 2013-03-26 DIAGNOSIS — Z79899 Other long term (current) drug therapy: Secondary | ICD-10-CM | POA: Diagnosis not present

## 2013-04-03 ENCOUNTER — Non-Acute Institutional Stay: Payer: Medicare Other | Admitting: Geriatric Medicine

## 2013-04-03 ENCOUNTER — Encounter: Payer: Self-pay | Admitting: Geriatric Medicine

## 2013-04-03 ENCOUNTER — Other Ambulatory Visit: Payer: Self-pay | Admitting: Geriatric Medicine

## 2013-04-03 VITALS — BP 136/68 | HR 60 | Ht 72.0 in | Wt 213.0 lb

## 2013-04-03 DIAGNOSIS — I10 Essential (primary) hypertension: Secondary | ICD-10-CM | POA: Diagnosis not present

## 2013-04-03 DIAGNOSIS — Z7189 Other specified counseling: Secondary | ICD-10-CM | POA: Insufficient documentation

## 2013-04-03 DIAGNOSIS — I4891 Unspecified atrial fibrillation: Secondary | ICD-10-CM | POA: Diagnosis not present

## 2013-04-03 DIAGNOSIS — Z66 Do not resuscitate: Secondary | ICD-10-CM

## 2013-04-03 DIAGNOSIS — Z7901 Long term (current) use of anticoagulants: Secondary | ICD-10-CM

## 2013-04-03 NOTE — Assessment & Plan Note (Signed)
Discussed DO NOT RESUSCITATE order and implications. Patient understands that if his heart stops we will not perform CPR or advanced life support procedures. All other interventions will be continued, including hospitalization. Will update our chart and give copy to [place] to update their records.

## 2013-04-03 NOTE — Assessment & Plan Note (Signed)
Blood pressure stable on current medications. Update labs before next visit

## 2013-04-03 NOTE — Assessment & Plan Note (Signed)
Heart rate remains controlled, continue current medications including anticoagulation.

## 2013-04-03 NOTE — Progress Notes (Signed)
Patient ID: Gary [name], male   DOB: Mar 03, 1916, 79 y.o.   MRN: [id number] [facility] [phone])  Code Status: DNR Contact Information   Name Relation Home Work Mobile   [place] Son [phone]        Chief Complaint  Patient presents with  . Medical Managment of Chronic Issues    A-Fib, blood pressure    HPI: This is a 79 y.o. male resident of [place],  Independent Living section evaluated today for management of ongoing medical issues.  Patient reports he is feeling well, no acute changes to his health. Has been taking medications as directed. He specifically denies any significant shortness of breath chest pain. He does have daily edema in his ankles which improves after exercise. He asks today to discuss/sign  DO NOT RESUSCITATE order. Recent lab satisfactory.   Allergies  Allergen Reactions  . Plavix (Clopidogrel Bisulfate)     indigestion   Medications    Medication List       This list is accurate as of: 04/03/13 11:38 AM.  Always use your most recent med list.               Acetaminophen 500 MG coapsule  Take by mouth. Take 2 tablets once daily as needed     CENTRUM SILVER PO  Take by mouth. Take one tablet once daily     doxazosin 8 MG tablet  Commonly known as:  CARDURA  TAKE 1 TABLET BY MOUTH AT BEDTIME TO TREAT BENIGN PROSTATIC HYPERTROPHY     furosemide 20 MG tablet  Commonly known as:  LASIX  TAKE 1 TABLET BY MOUTH DAILY TO PREVENT FLUID ACCUMULATION     glycopyrrolate 1 MG tablet  Commonly known as:  ROBINUL  Take 1 mg by mouth. Take one tablet daily as needed to treat loose stools     metoprolol succinate 50 MG 24 hr tablet  Commonly known as:  TOPROL-XL  Take 50 mg by mouth daily. Take one daily at bedtime to regulate heart rate and rhythm     polyethylene glycol packet  Commonly known as:  MIRALAX / GLYCOLAX  Take 17 g by mouth daily. Mix in 4-8oz beverage daily to relieve constipation.  Reduce dose to every other day when stools become loose     ranitidine 75 MG tablet  Commonly known as:  ZANTAC  Take 75 mg by mouth. Take one daily to treat reflux     XARELTO 20 MG Tabs  Generic drug:  Rivaroxaban  Take by mouth daily. For anticoagulation to prevent stroke       DATA REVIEWED  Radiologic Exams:   Cardiovascular Exams:   Laboratory Studies:  11/03/2011 PT 24.4, INR 2.11 Lipid: cholesterol 137, triglyceride 80, HDL 45, LDL 76 TSH 5.451 05/22/2012 CBC: Wbc 4.7, Rbc 4.49, Hgb 14.1, Hct 39.6, Platelet 149 CMP:  Sodium 142, Potassium 4.1, glucose 96, BUN 30, Creatinine 1.49 Lipid: cholesterol 142, triglyceride 94, HDL 39, LDL 84 09/25/2012 INR 1.97 CMP: Sodium 141, Potassium 3.9, glucose 96, BUN 30, Creatinine 1.50 TSH 5.214 HgbA1c 5.8 12/27/2012: Glucose 98, BUN 25, creatinine 1.59, sodium 144, potassium 3.9.             Total cholesterol 1:30, triglyceride 81, HDL 43, LDL 71.             A1c 5.6             PSA 1.69 Review of Systems  DATA OBTAINED: from patient,  nurse, medical record, caregiver, family member GENERAL: Feels well   No fevers, fatigue, change in appetite or weight SKIN: No itch, rash or open wounds EYES: No eye pain, dryness or itching  No change in vision EARS: No earache or change in hearing NOSE: No congestion, drainage or bleeding MOUTH/THROAT: No mouth or tooth pain  No sore throat No difficulty chewing or swallowing RESPIRATORY: No cough, wheezing, SOB CARDIAC: No chest pain,  Occasional palpitation at night, ankle edema daily GI: No abdominal pain  No N/V/D or constipation  No heartburn or reflux  GU: No dysuria, frequency or urgency  No change in urine volume or character No nocturia or change in stream   MUSCULOSKELETAL: No joint pain, swelling or stiffness  No back pain  No muscle ache, pain, weakness  Gait is steady  No recent falls.  NEUROLOGIC: No dizziness, fainting, headache,  No change in mental status.  PSYCHIATRIC: No  feelings of anxiety, depression Sleeps well.    Physical Exam Filed Vitals:   04/03/13 1106  BP: 136/68  Pulse: 60  Height: 6' (1.829 m)  Weight: 213 lb (96.616 kg)   Body mass index is 28.88 kg/(m^2). GENERAL APPEARANCE: No acute distress, appropriately groomed, Overweight body habitus. Alert, pleasant, conversant. HEAD: Normocephalic, atraumatic EYES: Conjunctiva/lids clear. Pupils round, reactive.   EARS:  Poor hearing NOSE: No deformity or discharge. MOUTH/THROAT: Lips w/o lesions. Oral mucosa, tongue moist, w/o lesion. Oropharynx w/o redness or lesions.   NECK: Supple, full ROM. No thyroid tenderness, enlargement or nodule LYMPHATICS: No head, neck or supraclavicular adenopathy RESPIRATORY: Breathing is even, unlabored. Lung sounds are clear and full.   CARDIOVASCULAR: Heart IRRR. No murmur or extra heart sounds                         EDEMA: 1+ ankle edema bilateral GASTROINTESTINAL: Abdomen is soft, non-tender, not distended w/ normal bowel sounds.   MUSCULOSKELETAL:. Gait is steady, slow NEUROLOGIC: Oriented to time, place, person. Speech clear, no tremor.   PSYCHIATRIC: Mood and affect appropriate to situation  ASSESSMENT/PLAN  Atrial fibrillation Heart rate remains controlled, continue current medications including anticoagulation.  Unspecified essential hypertension Blood pressure stable on current medications. Update labs before next visit  Long term (current) use of anticoagulants Continues anticoagulation with Zaroxolyn. No adverse effects noted. Current CBC stable. Update renal function lab at next visit  Advanced directives, counseling/discussion Discussed DO NOT RESUSCITATE order and implications. Patient understands that if his heart stops we will not perform CPR or advanced life support procedures. All other interventions will be continued, including hospitalization. Will update our chart and give copy to [place] to update their records.    Time:  40 minutes spent with patient, >50% spent counseling, care coordination  Follow up: 4 months  Lab 07/2013 CBC, BMP, TSH  Angelicia Lessner T.Jacody Beneke, NP-C 04/03/2013

## 2013-04-03 NOTE — Assessment & Plan Note (Signed)
Continues anticoagulation with Zaroxolyn. No adverse effects noted. Current CBC stable. Update renal function lab at next visit

## 2013-04-15 ENCOUNTER — Encounter: Payer: Self-pay | Admitting: Internal Medicine

## 2013-06-02 ENCOUNTER — Other Ambulatory Visit: Payer: Self-pay | Admitting: Internal Medicine

## 2013-07-20 ENCOUNTER — Other Ambulatory Visit: Payer: Self-pay | Admitting: Geriatric Medicine

## 2013-07-23 DIAGNOSIS — Z79899 Other long term (current) drug therapy: Secondary | ICD-10-CM | POA: Diagnosis not present

## 2013-07-23 DIAGNOSIS — I10 Essential (primary) hypertension: Secondary | ICD-10-CM | POA: Diagnosis not present

## 2013-07-23 DIAGNOSIS — I4891 Unspecified atrial fibrillation: Secondary | ICD-10-CM | POA: Diagnosis not present

## 2013-07-23 LAB — BASIC METABOLIC PANEL
BUN: 29 mg/dL — AB (ref 4–21)
Glucose: 103 mg/dL
Potassium: 4 mmol/L (ref 3.4–5.3)
Sodium: 143 mmol/L (ref 137–147)

## 2013-07-23 LAB — TSH: TSH: 6.14 u[IU]/mL — AB (ref 0.41–5.90)

## 2013-07-31 ENCOUNTER — Encounter: Payer: Self-pay | Admitting: Geriatric Medicine

## 2013-08-07 ENCOUNTER — Encounter: Payer: Self-pay | Admitting: Geriatric Medicine

## 2013-08-07 ENCOUNTER — Non-Acute Institutional Stay: Payer: Medicare Other | Admitting: Geriatric Medicine

## 2013-08-07 VITALS — BP 124/72 | HR 72 | Ht 72.0 in | Wt 217.0 lb

## 2013-08-07 DIAGNOSIS — I10 Essential (primary) hypertension: Secondary | ICD-10-CM

## 2013-08-07 DIAGNOSIS — I4891 Unspecified atrial fibrillation: Secondary | ICD-10-CM | POA: Diagnosis not present

## 2013-08-07 DIAGNOSIS — Z7901 Long term (current) use of anticoagulants: Secondary | ICD-10-CM

## 2013-08-07 DIAGNOSIS — N183 Chronic kidney disease, stage 3 unspecified: Secondary | ICD-10-CM

## 2013-08-07 HISTORY — DX: Chronic kidney disease, stage 3 unspecified: N18.30

## 2013-08-07 MED ORDER — DOXAZOSIN MESYLATE 8 MG PO TABS: 4.0000 mg | ORAL_TABLET | Freq: Every day | ORAL | Status: DC

## 2013-08-07 NOTE — Assessment & Plan Note (Signed)
Patient continues anticoagulation with Zaroxolyn. No adverse effects noted. Current CBC is stable, renal function stable as well.

## 2013-08-07 NOTE — Progress Notes (Signed)
Patient ID: Gary [name], male   DOB: [birth year], 79 y.o.   MRN: [id number]  [facility] [phone])  Code Status: DNR     Contact Information   Name Relation Home Work Mobile   Gary [name] Son [phone]        Chief Complaint  Patient presents with  . Medical Managment of Chronic Issues    A-Fib, blood pressure  . Headache    forehead yesterday took Tylenol and slept all day. Better today. Doesn't get headaches.     HPI: This is a 79 y.o. male resident of [place],  Independent Living section evaluated today for management of ongoing medical issues.   Last visit:Atrial fibrillation Heart rate remains controlled, continue current medications including anticoagulation.  Unspecified essential hypertension Blood pressure stable on current medications. Update labs before next visit  Long term (current) use of anticoagulants Continues anticoagulation with Zaroxolyn. No adverse effects noted. Current CBC stable. Update renal function lab at next visit  Advanced directives, counseling/discussion Discussed DO NOT RESUSCITATE order and implications. Patient understands that if his heart stops we will not perform CPR or advanced life support procedures. All other interventions will be continued, including hospitalization. Will update our chart and give copy to [place] to update their records.  Since last visit patient has not had any acute medical issues. He and his family celebrated his 59th birthday last week and was a party. Patient does report he had a headache yesterday this is unusual for him. He took Tylenol during the day headache slowly got better he slept well last night. Does report he did not sleep well the night before. No nausea vomiting or other symptoms associated with the headache. Patient reports he feels his eyes have been strained and is planning to have an eye exam.  Recent labs show stable renal function, CBC  is normal. TSH with mildly elevated reading, asymptomatic.  Allergies  Allergen Reactions  . Plavix [Clopidogrel Bisulfate]     indigestion      Medication List       This list is accurate as of: 08/07/13 11:22 AM.  Always use your most recent med list.               Acetaminophen 500 MG coapsule  Take by mouth. Take 2 tablets once daily as needed     CENTRUM SILVER PO  Take by mouth. Take one tablet once daily     doxazosin 8 MG tablet  Commonly known as:  CARDURA  TAKE 1 TABLET AT BEDTIME TO TREAT BENIGN PROSTATIC HYPERTROPHY     furosemide 20 MG tablet  Commonly known as:  LASIX  TAKE 1 TABLET BY MOUTH DAILY TO PREVENT FLUID ACCUMULATION     metoprolol succinate 50 MG 24 hr tablet  Commonly known as:  TOPROL-XL  TAKE 1 TABLET DAILY AT BEDTIME TO REGULATE HEART RATE AND RHYTHM     polyethylene glycol packet  Commonly known as:  MIRALAX / GLYCOLAX  Take 17 g by mouth daily. Mix in 4-8oz beverage daily to relieve constipation. Reduce dose to every other day when stools become loose     ranitidine 75 MG tablet  Commonly known as:  ZANTAC  Take 75 mg by mouth. Take one daily to treat reflux     XARELTO 20 MG Tabs tablet  Generic drug:  Rivaroxaban  Take by mouth daily. For anticoagulation to prevent stroke       DATA REVIEWED  Radiologic  Exams:   Cardiovascular Exams:   Laboratory Studies:  11/03/2011 PT 24.4, INR 2.11 Lipid: cholesterol 137, triglyceride 80, HDL 45, LDL 76 TSH 5.451 05/22/2012 CBC: Wbc 4.7, Rbc 4.49, Hgb 14.1, Hct 39.6, Platelet 149 CMP:  Sodium 142, Potassium 4.1, glucose 96, BUN 30, Creatinine 1.49 Lipid: cholesterol 142, triglyceride 94, HDL 39, LDL 84  09/25/2012 INR 1.97 CMP: Sodium 141, Potassium 3.9, glucose 96, BUN 30, Creatinine 1.50 TSH 5.214 HgbA1c 5.8  12/27/2012: Glucose 98, BUN 25, creatinine 1.59, sodium 144, potassium 3.9.             Total cholesterol 1:30, triglyceride 81, HDL 43, LDL 71.             A1c 5.6              PSA 1.69 Lab Results  Component Value Date   WBC 4.9 07/23/2013   HGB 14.2 07/23/2013   HCT 41 07/23/2013   PLT 162 07/23/2013        GLUCOSE 103* 07/23/2013   NA 143 07/23/2013   K 4.0 07/23/2013   CL 106 07/23/2013   CREATININE 1.5* 07/23/2013   BUN 29* 07/23/2013   CO2 30 07/27/2007        TSH 6.14* 07/23/2013    Review of Systems  DATA OBTAINED: from patient, nurse, medical record, caregiver, family member GENERAL: Feels well   No fevers, fatigue, change in appetite or weight SKIN: No itch, rash or open wounds EYES: No eye pain, dryness or itching  No change in vision EARS: No earache or change in hearing NOSE: No congestion, drainage or bleeding MOUTH/THROAT: No mouth or tooth pain  No sore throat No difficulty chewing or swallowing RESPIRATORY: No cough, wheezing, SOB CARDIAC: No chest pain,  Occasional palpitation at night, ankle edema daily GI: No abdominal pain  No N/V/D or constipation  No heartburn or reflux  GU: No dysuria, frequency or urgency  No change in urine volume or character No nocturia or change in stream   MUSCULOSKELETAL: No joint pain, swelling or stiffness  No back pain  No muscle ache, pain, weakness  Gait is unsteady, avoids stairs, feels very unbalanced going down stairs  No recent falls.  NEUROLOGIC: No dizziness, fainting, headache,  No change in mental status.  PSYCHIATRIC: No feelings of anxiety, depression Sleeps well.    Physical Exam Filed Vitals:   08/07/13 1053  BP: 124/72  Pulse: 72  Height: 6' (1.829 m)  Weight: 217 lb (98.431 kg)   Body mass index is 29.42 kg/(m^2). GENERAL APPEARANCE: No acute distress, appropriately groomed, Overweight body habitus. Alert, pleasant, conversant. HEAD: Normocephalic, atraumatic EYES: Conjunctiva/lids clear. Pupils round, reactive.   EARS:  Poor hearing NOSE: No deformity or discharge. MOUTH/THROAT: Lips w/o lesions. Oral mucosa, tongue moist, w/o lesion. Oropharynx w/o redness or lesions.     NECK: Supple, full ROM. No thyroid tenderness, enlargement or nodule LYMPHATICS: No head, neck or supraclavicular adenopathy RESPIRATORY: Breathing is even, unlabored. Lung sounds are clear and full.   CARDIOVASCULAR: Heart IRRR. No murmur or extra heart sounds                         EDEMA: 1+ ankle edema bilateral GASTROINTESTINAL: Abdomen is soft, non-tender, not distended w/ normal bowel sounds.   MUSCULOSKELETAL:. Gait is steady, slow NEUROLOGIC: Oriented to time, place, person. Speech clear, no tremor.   PSYCHIATRIC: Mood and affect appropriate to situation  ASSESSMENT/PLAN  Atrial fibrillation Patient  with regular heart rhythm today, rate remains well controlled. Continue current medications including anticoagulation  Unspecified essential hypertension Patient report reports home blood pressure readings 117/65 usually., In clinic today 124/72. We'll reduce doxazosin dose to 4 mg, this medication may be contributing to his unbalanced gait. Have asked patient to check his blood pressure one to 2 times a week at home.  Chronic kidney disease, stage III (moderate) Most recent GFR 39.2, this has been stable for about the last year.  Long term (current) use of anticoagulants Patient continues anticoagulation with Zaroxolyn. No adverse effects noted. Current CBC is stable, renal function stable as well.   Follow up: 4 months   Lekita Kerekes T.Lyndsey Demos, NP-C 08/07/2013

## 2013-08-07 NOTE — Assessment & Plan Note (Signed)
Patient with regular heart rhythm today, rate remains well controlled. Continue current medications including anticoagulation

## 2013-08-07 NOTE — Assessment & Plan Note (Signed)
Most recent GFR 39.2, this has been stable for about the last year.

## 2013-08-07 NOTE — Assessment & Plan Note (Signed)
Patient report reports home blood pressure readings 117/65 usually., In clinic today 124/72. We'll reduce doxazosin dose to 4 mg, this medication may be contributing to his unbalanced gait. Have asked patient to check his blood pressure one to 2 times a week at home.

## 2013-08-14 ENCOUNTER — Other Ambulatory Visit: Payer: Self-pay | Admitting: Geriatric Medicine

## 2013-09-13 DIAGNOSIS — H52209 Unspecified astigmatism, unspecified eye: Secondary | ICD-10-CM | POA: Diagnosis not present

## 2013-09-13 DIAGNOSIS — H18519 Endothelial corneal dystrophy, unspecified eye: Secondary | ICD-10-CM | POA: Diagnosis not present

## 2013-09-13 DIAGNOSIS — Z961 Presence of intraocular lens: Secondary | ICD-10-CM | POA: Diagnosis not present

## 2013-10-24 ENCOUNTER — Other Ambulatory Visit: Payer: Self-pay | Admitting: Geriatric Medicine

## 2013-12-04 ENCOUNTER — Non-Acute Institutional Stay: Payer: Medicare Other | Admitting: Geriatric Medicine

## 2013-12-04 ENCOUNTER — Encounter: Payer: Self-pay | Admitting: Geriatric Medicine

## 2013-12-04 VITALS — BP 100/58 | HR 76 | Ht 72.0 in | Wt 212.0 lb

## 2013-12-04 DIAGNOSIS — Z0000 Encounter for general adult medical examination without abnormal findings: Secondary | ICD-10-CM

## 2013-12-04 DIAGNOSIS — I10 Essential (primary) hypertension: Secondary | ICD-10-CM

## 2013-12-04 DIAGNOSIS — N183 Chronic kidney disease, stage 3 unspecified: Secondary | ICD-10-CM

## 2013-12-04 DIAGNOSIS — R5383 Other fatigue: Principal | ICD-10-CM

## 2013-12-04 DIAGNOSIS — I4891 Unspecified atrial fibrillation: Secondary | ICD-10-CM

## 2013-12-04 DIAGNOSIS — Z79899 Other long term (current) drug therapy: Secondary | ICD-10-CM | POA: Diagnosis not present

## 2013-12-04 DIAGNOSIS — R5381 Other malaise: Secondary | ICD-10-CM | POA: Diagnosis not present

## 2013-12-04 DIAGNOSIS — Z7189 Other specified counseling: Secondary | ICD-10-CM | POA: Insufficient documentation

## 2013-12-04 NOTE — Assessment & Plan Note (Signed)
The patient's main complaint today is feeling so fatigued. He has a very low activity status, mostly by [employer]. He reports he does not like to exercise doesn't like to walk. Walking is more difficult for him as he becomes more unbalanced. Extensive discussion today regarding muscle deconditioning as well as general aging, heart and lung function. Recommend physical therapy to help patient maintain or improve current functional status, he's not interested at this time but may consider it in the future. He would like a power wheelchair once he moves into the apartment building. Recommend OT evaluation for appropriate vehicle and education and training in its use.

## 2013-12-04 NOTE — Assessment & Plan Note (Signed)
Remains with a regular rhythm, rate well controlled at rest, significant elevation today with walking. No chest pain, mild shortness of breath.

## 2013-12-04 NOTE — Assessment & Plan Note (Signed)
Last lab about 5 months ago, recheck kidney function due to increasing fatigue
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
## 2013-12-04 NOTE — Assessment & Plan Note (Signed)
Patient agreed to pharmacogenetic testing today. Once results are received, will contact patient if medication adjustments are required

## 2013-12-04 NOTE — Assessment & Plan Note (Signed)
Patient's blood pressure today 100/58 is lower than recent readings (124/76, 136/68) and may account for some of his complaints of fatigue. Previously lowered dose of Cardura, will stop this medication now

## 2013-12-05 DIAGNOSIS — R5381 Other malaise: Secondary | ICD-10-CM | POA: Diagnosis not present

## 2013-12-05 DIAGNOSIS — R5383 Other fatigue: Secondary | ICD-10-CM | POA: Diagnosis not present

## 2013-12-05 DIAGNOSIS — N183 Chronic kidney disease, stage 3 unspecified: Secondary | ICD-10-CM | POA: Diagnosis not present

## 2013-12-05 DIAGNOSIS — I10 Essential (primary) hypertension: Secondary | ICD-10-CM | POA: Diagnosis not present

## 2013-12-05 DIAGNOSIS — Z79899 Other long term (current) drug therapy: Secondary | ICD-10-CM | POA: Diagnosis not present

## 2013-12-10 DIAGNOSIS — R279 Unspecified lack of coordination: Secondary | ICD-10-CM | POA: Diagnosis not present

## 2013-12-10 DIAGNOSIS — M549 Dorsalgia, unspecified: Secondary | ICD-10-CM | POA: Diagnosis not present

## 2013-12-10 DIAGNOSIS — M6281 Muscle weakness (generalized): Secondary | ICD-10-CM | POA: Diagnosis not present

## 2013-12-11 ENCOUNTER — Telehealth: Payer: Self-pay

## 2013-12-11 DIAGNOSIS — R279 Unspecified lack of coordination: Secondary | ICD-10-CM | POA: Diagnosis not present

## 2013-12-11 DIAGNOSIS — M6281 Muscle weakness (generalized): Secondary | ICD-10-CM | POA: Diagnosis not present

## 2013-12-11 DIAGNOSIS — M549 Dorsalgia, unspecified: Secondary | ICD-10-CM | POA: Diagnosis not present

## 2013-12-11 NOTE — Telephone Encounter (Signed)
Called patient about 12/05/13 labs. Labs results didn't show any reason for fatigue. Patient having problem with urine flow showed down since stopping Cardura. Per Claudette resume Cardura 4mg .

## 2013-12-20 DIAGNOSIS — M6281 Muscle weakness (generalized): Secondary | ICD-10-CM | POA: Diagnosis not present

## 2013-12-20 DIAGNOSIS — M549 Dorsalgia, unspecified: Secondary | ICD-10-CM | POA: Diagnosis not present

## 2013-12-20 DIAGNOSIS — R279 Unspecified lack of coordination: Secondary | ICD-10-CM | POA: Diagnosis not present

## 2013-12-27 DIAGNOSIS — M549 Dorsalgia, unspecified: Secondary | ICD-10-CM | POA: Diagnosis not present

## 2013-12-27 DIAGNOSIS — R279 Unspecified lack of coordination: Secondary | ICD-10-CM | POA: Diagnosis not present

## 2013-12-27 DIAGNOSIS — M6281 Muscle weakness (generalized): Secondary | ICD-10-CM | POA: Diagnosis not present

## 2013-12-29 LAB — BASIC METABOLIC PANEL
BUN: 28 mg/dL — AB (ref 4–21)
BUN: 28 mg/dL — AB (ref 4–21)
Creatinine: 1.3 mg/dL (ref 0.6–1.3)
Creatinine: 1.3 mg/dL (ref 0.6–1.3)
GLUCOSE: 118 mg/dL
Glucose: 118 mg/dL
Potassium: 3.8 mmol/L (ref 3.4–5.3)
Potassium: 3.8 mmol/L (ref 3.4–5.3)
Sodium: 139 mmol/L (ref 137–147)
Sodium: 139 mmol/L (ref 137–147)

## 2013-12-29 LAB — CBC AND DIFFERENTIAL
HCT: 41 % (ref 41–53)
HEMOGLOBIN: 14 g/dL (ref 13.5–17.5)
Hemoglobin: 14.1 g/dL (ref 13.5–17.5)
PLATELETS: 148 10*3/uL — AB (ref 150–399)
Platelets: 148 10*3/uL — AB (ref 150–399)
WBC: 4.1 10^3/mL
WBC: 4.1 10^3/mL

## 2013-12-31 ENCOUNTER — Encounter: Payer: Self-pay | Admitting: Geriatric Medicine

## 2013-12-31 ENCOUNTER — Other Ambulatory Visit: Payer: Self-pay | Admitting: *Deleted

## 2013-12-31 DIAGNOSIS — R279 Unspecified lack of coordination: Secondary | ICD-10-CM | POA: Diagnosis not present

## 2013-12-31 DIAGNOSIS — M6281 Muscle weakness (generalized): Secondary | ICD-10-CM | POA: Diagnosis not present

## 2013-12-31 DIAGNOSIS — M549 Dorsalgia, unspecified: Secondary | ICD-10-CM | POA: Diagnosis not present

## 2013-12-31 MED ORDER — RIVAROXABAN 20 MG PO TABS: ORAL_TABLET | ORAL | Status: DC

## 2013-12-31 NOTE — Telephone Encounter (Signed)
Patient Requested 

## 2014-01-02 DIAGNOSIS — R279 Unspecified lack of coordination: Secondary | ICD-10-CM | POA: Diagnosis not present

## 2014-01-02 DIAGNOSIS — M6281 Muscle weakness (generalized): Secondary | ICD-10-CM | POA: Diagnosis not present

## 2014-01-02 DIAGNOSIS — M549 Dorsalgia, unspecified: Secondary | ICD-10-CM | POA: Diagnosis not present

## 2014-01-07 DIAGNOSIS — R279 Unspecified lack of coordination: Secondary | ICD-10-CM | POA: Diagnosis not present

## 2014-01-07 DIAGNOSIS — M549 Dorsalgia, unspecified: Secondary | ICD-10-CM | POA: Diagnosis not present

## 2014-01-07 DIAGNOSIS — M6281 Muscle weakness (generalized): Secondary | ICD-10-CM | POA: Diagnosis not present

## 2014-01-09 DIAGNOSIS — M549 Dorsalgia, unspecified: Secondary | ICD-10-CM | POA: Diagnosis not present

## 2014-01-09 DIAGNOSIS — M6281 Muscle weakness (generalized): Secondary | ICD-10-CM | POA: Diagnosis not present

## 2014-01-09 DIAGNOSIS — R279 Unspecified lack of coordination: Secondary | ICD-10-CM | POA: Diagnosis not present

## 2014-03-05 ENCOUNTER — Encounter: Payer: Self-pay | Admitting: Geriatric Medicine

## 2014-03-05 ENCOUNTER — Encounter: Payer: Self-pay | Admitting: Nurse Practitioner

## 2014-03-12 ENCOUNTER — Encounter: Payer: Self-pay | Admitting: Nurse Practitioner

## 2014-03-12 ENCOUNTER — Non-Acute Institutional Stay: Payer: Medicare Other | Admitting: Nurse Practitioner

## 2014-03-12 VITALS — BP 120/70 | HR 72 | Temp 97.9°F | Wt 208.0 lb

## 2014-03-12 DIAGNOSIS — I4891 Unspecified atrial fibrillation: Secondary | ICD-10-CM | POA: Diagnosis not present

## 2014-03-12 DIAGNOSIS — I482 Chronic atrial fibrillation, unspecified: Secondary | ICD-10-CM

## 2014-03-12 DIAGNOSIS — N183 Chronic kidney disease, stage 3 unspecified: Secondary | ICD-10-CM

## 2014-03-12 DIAGNOSIS — N400 Enlarged prostate without lower urinary tract symptoms: Secondary | ICD-10-CM | POA: Diagnosis not present

## 2014-03-12 DIAGNOSIS — F411 Generalized anxiety disorder: Secondary | ICD-10-CM

## 2014-03-12 DIAGNOSIS — L600 Ingrowing nail: Secondary | ICD-10-CM

## 2014-03-12 DIAGNOSIS — I10 Essential (primary) hypertension: Secondary | ICD-10-CM | POA: Diagnosis not present

## 2014-03-12 NOTE — Progress Notes (Signed)
Patient ID: Gary [name], male   DOB: 19-Dec-1915, 80 y.o.   MRN: [id number]    Allergies  Allergen Reactions  . Plavix [Clopidogrel Bisulfate]     indigestion    Chief Complaint  Patient presents with  . Medical Management of Chronic Issues    blood pressure, fatigue, A-Fib  . infected finger    middle finger left hand infected around nail for 3 weeks. Pulled hang nail off  and if became tender and infected.    HPI: This is a 80 y.o. male resident of [place],  Independent Living section evaluated today for management of ongoing medical issues.  at last visit had fatigue and low energy, labs did now show reason for this, reported worsening nocturia with dc of doxazosin so this was restarted and urinary symptoms improve Reports he had injections of B12 at one time Now he is taking B12 tablets which are not as effective Starting to grind teeth, reports a lot of stress- plans to move into an apartment from his [place] middle finger left hand with increased tenderness and redness, pulled nail 3 weeks ago, normal works itself out but only getting worse now.  Review of Systems:  DATA OBTAINED: from patient GENERAL:   No fevers, change in appetite or weight SKIN: No itch, rash or open wounds EYES: No eye pain, dryness or itching  No change in vision  EARS: No earache or change in hearing (HOH) NOSE: No congestion, drainage or bleeding MOUTH/THROAT: No mouth or tooth pain  No sore throat No difficulty chewing or swallowing RESPIRATORY: No cough, wheezing, SOB CARDIAC: No chest pain,  Occasional palpitation at night, ankle edema daily GI: No abdominal pain  No N/V/D or constipation  No heartburn or reflux   GU: No dysuria, frequency or urgency  No change in urine volume or character No increased nocturia or change in stream   MUSCULOSKELETAL: Mild joint aching with ambulation  No back pain  No muscle ache, pain, weakness  Gait is unsteady, uses cane  No recent falls.    NEUROLOGIC: No dizziness, fainting, headache,  No change in mental status.   PSYCHIATRIC: No feelings of anxiety, depression Sleeps well but feels stressed frequently, does not like to term anxious   Past Medical History  Diagnosis Date  . Urinary frequency 10/01/2012  . Unspecified constipation 07/04/2012  . Irritable bowel syndrome 11/14/2011  . 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 Unspecified hereditary and idiopathic peripheral neuropathy 02/2010  . Pain in joint, lower leg 02/2010  . Insomnia with sleep apnea, unspecified 02/2010  . Abnormality of gait 02/2010  . Diarrhea 02/2010  . Long term (current) use of anticoagulants 11/2009  . Atrial fibrillation 11/2009  . Deviated nasal septum 11/2009  . Other dyspnea and respiratory abnormality 11/2009  . Other abnormal blood chemistry 02/09/2009  . Edema 2010  . Dizziness and giddiness 2009  . Tension headache 2009  . Sebaceous cyst 2008  . Other B-complex deficiencies 2008  . Other malaise and fatigue 2007  . Essential and other specified forms of tremor 2007  . Unspecified essential hypertension 2007  . Reflux esophagitis 2007  . Diaphragmatic hernia without mention of obstruction or gangrene 2007  . Palpitations 2007  . Unspecified transient cerebral ischemia 2002  . Hypertrophy of prostate without urinary obstruction and other lower urinary tract symptoms (LUTS) 2002  . Undiagnosed cardiac murmurs 2002  . Anal and rectal polyp 1980  . Hyperglycemia 01/02/2013  .  Chronic kidney disease, stage III (moderate) 08/07/2013   History reviewed. No pertinent past surgical history. Social History:   reports that he has never smoked. He has never used smokeless tobacco. He reports that he drinks alcohol. He reports that he does not use illicit drugs.  Family History  Problem Relation Age of Onset  . Congestive Heart Failure Mother   . Diabetes Father   . Heart disease Father   . Cancer Sister     lung      Medications: Patient's Medications  New Prescriptions   No medications on file  Previous Medications   ACETAMINOPHEN 500 MG COAPSULE    Take by mouth. Take 2 tablets once daily as needed   DOXAZOSIN (CARDURA) 4 MG TABLET    Take 4 mg by mouth daily. For prostate   FUROSEMIDE (LASIX) 20 MG TABLET    TAKE 1 TABLET BY MOUTH DAILY TO PREVENT FLUID ACCUMULATION   METOPROLOL SUCCINATE (TOPROL-XL) 50 MG 24 HR TABLET    TAKE 1 TABLET DAILY AT BEDTIME TO REGULATE HEART RATE AND RHYTHM   MULTIPLE VITAMINS-MINERALS (CENTRUM SILVER PO)    Take by mouth. Take one tablet once daily   POLYETHYLENE GLYCOL (MIRALAX / GLYCOLAX) PACKET    Take 17 g by mouth daily. Mix in 4-8oz beverage daily to relieve constipation. Reduce dose to every other day when stools become loose   RANITIDINE (ZANTAC) 75 MG TABLET    Take 75 mg by mouth. Take one daily to treat reflux   RIVAROXABAN (XARELTO) 20 MG TABS TABLET    Take one tablet by mouth once daily For anticoagulation to prevent stroke  Modified Medications   No medications on file  Discontinued Medications   No medications on file     Physical Exam:  Filed Vitals:   03/12/14 1103  BP: 120/70  Pulse: 72  Temp: 97.9 F (36.6 C)  TempSrc: Oral  Weight: 208 lb (94.348 kg)  SpO2: 98%    GENERAL APPEARANCE: No acute distress, appropriately groomed, Overweight body habitus. Alert, pleasant, conversant. Skin: swollen, red and tender to left 3rd finger with purulent drainage pooled under the skin   HEAD: Normocephalic, atraumatic EYES: Conjunctiva/lids clear. Pupils round, reactive.   EARS:  Very Poor hearing NOSE: No deformity or discharge. MOUTH/THROAT: Lips w/o lesions. Oral mucosa, tongue moist, w/o lesion. Oropharynx w/o redness or lesions.   NECK: Supple, full ROM. No thyroid tenderness, enlargement or nodule LYMPHATICS: No head, neck or supraclavicular adenopathy RESPIRATORY: Breathing is even, unlabored. Lung sounds are clear and full.    CARDIOVASCULAR: Heart IRRR. No murmur or extra heart sounds                         EDEMA: 1+ ankle edema bilateral GASTROINTESTINAL: Abdomen is rotund, soft, non-tender, not distended w/ normal bowel sounds.   MUSCULOSKELETAL:.  Slow to stand, uses cane NEUROLOGIC: Oriented to time, place, person. Speech clear, no tremor.    PSYCHIATRIC: Mood and affect appropriate to situation   Labs reviewed: Basic Metabolic Panel:  Recent Labs  07/23/13  NA 143  K 4.0  BUN 29*  CREATININE 1.5*  TSH 6.14*   Liver Function Tests: No results found for this basename: AST, ALT, ALKPHOS, BILITOT, PROT, ALBUMIN,  in the last 8760 hours No results found for this basename: LIPASE, AMYLASE,  in the last 8760 hours No results found for this basename: AMMONIA,  in the last 8760 hours CBC:  Recent Labs  07/23/13  WBC 4.9  HGB 14.2  HCT 41  PLT 162   Lipid Panel: No results found for this basename: CHOL, HDL, LDLCALC, TRIG, CHOLHDL, LDLDIRECT,  in the last 8760 hours TSH:  Recent Labs  07/23/13  TSH 6.14*   A1C: No results found for this basename: HGBA1C     Assessment/Plan  1. Chronic atrial fibrillation -currently rate controlled, conts on toprol and xarelto   2. Unspecified essential hypertension -has remained stable  3. Chronic kidney disease, stage III (moderate) -has remained stable  4. BPH (benign prostatic hyperplasia) -conts on cardura  5. Ingrown fingernail -will have pt start warm epsom salt soaks three times daily for the next week, start on doxycyline 100 mg BID for 10 days -will follow up finger in 1 week  6. Anxiety state, unspecified -pt with teeth grinding, did not like offer for mouth guard, relates this to stress and some worry -will start zoloft 50 mg daily as moving process is prolong and seems to be causing pt increase amount of anxiety, has recently stop driving except for within [place] -side effects discussed with pt and to call if any adverse effects  occur -to follow up in 1 month after starting zoloft

## 2014-03-18 ENCOUNTER — Other Ambulatory Visit: Payer: Self-pay

## 2014-03-18 MED ORDER — METOPROLOL SUCCINATE ER 50 MG PO TB24: ORAL_TABLET | ORAL | Status: DC

## 2014-03-19 ENCOUNTER — Non-Acute Institutional Stay: Payer: Medicare Other | Admitting: Nurse Practitioner

## 2014-03-19 ENCOUNTER — Encounter: Payer: Self-pay | Admitting: Nurse Practitioner

## 2014-03-19 VITALS — BP 122/62 | HR 68 | Temp 98.0°F

## 2014-03-19 DIAGNOSIS — F411 Generalized anxiety disorder: Secondary | ICD-10-CM

## 2014-03-19 DIAGNOSIS — L600 Ingrowing nail: Secondary | ICD-10-CM

## 2014-03-19 NOTE — Progress Notes (Signed)
Patient ID: Gary [name], male   DOB: [birth year], 80 y.o.   MRN: [id number]    Nursing Home Location:  [place] of Service: Clinic (12)  PCP: Estill Dooms, MD  Allergies  Allergen Reactions  . Plavix [Clopidogrel Bisulfate]     indigestion    Chief Complaint  Patient presents with  . Finger Injury    one week follow-up ingrown fingernail left hand    HPI:  This is a 80 y.o. male resident of [facility], [facility] section seen today for follow up on ingrown infected fingernail. Has tolerated doxycyline with mild GI discomfort. No diarrhea/nausea/vomitting. conts to do epsom salt with warm water soaks 3 times daily with much improvement in pain, redness and there is no longer purulent drainage under the skin.  Has started taking zoloft, does not note any effects at this time or side effects Review of Systems:  Review of Systems  Constitutional: Negative for fever, chills and malaise/fatigue.  Respiratory: Negative for shortness of breath.   Cardiovascular: Negative for chest pain.  Gastrointestinal:       Some discomfort after he takes medication but reports no pain, N/V/D  Musculoskeletal: Myalgias: slight tenderness to finger but much improved.  Skin: Negative.   Neurological: Negative for tingling and sensory change.     Past Medical History  Diagnosis Date  . Urinary frequency 10/01/2012  . Unspecified constipation 07/04/2012  . Irritable bowel syndrome 11/14/2011  . 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 Unspecified hereditary and idiopathic peripheral neuropathy 02/2010  . Pain in joint, lower leg 02/2010  . Insomnia with sleep apnea, unspecified 02/2010  . Abnormality of gait 02/2010  . Diarrhea 02/2010  . Long term (current) use of anticoagulants 11/2009  . Atrial fibrillation 11/2009  . Deviated nasal septum 11/2009  . Other dyspnea and respiratory abnormality 11/2009  . Other abnormal  blood chemistry 02/09/2009  . Edema 2010  . Dizziness and giddiness 2009  . Tension headache 2009  . Sebaceous cyst 2008  . Other B-complex deficiencies 2008  . Other malaise and fatigue 2007  . Essential and other specified forms of tremor 2007  . Unspecified essential hypertension 2007  . Reflux esophagitis 2007  . Diaphragmatic hernia without mention of obstruction or gangrene 2007  . Palpitations 2007  . Unspecified transient cerebral ischemia 2002  . Hypertrophy of prostate without urinary obstruction and other lower urinary tract symptoms (LUTS) 2002  . Undiagnosed cardiac murmurs 2002  . Anal and rectal polyp 1980  . Hyperglycemia 01/02/2013  . Chronic kidney disease, stage III (moderate) 08/07/2013   History reviewed. No pertinent past surgical history. Social History:   reports that he has never smoked. He has never used smokeless tobacco. He reports that he drinks alcohol. He reports that he does not use illicit drugs.  Family History  Problem Relation Age of Onset  . Congestive Heart Failure Mother   . Diabetes Father   . Heart disease Father   . Cancer Sister     lung     Medications: Patient's Medications  New Prescriptions   No medications on file  Previous Medications   ACETAMINOPHEN 500 MG COAPSULE    Take by mouth. Take 2 tablets once daily as needed   DOXAZOSIN (CARDURA) 4 MG TABLET    Take 4 mg by mouth daily. For prostate   FUROSEMIDE (LASIX) 20 MG TABLET    TAKE 1 TABLET BY MOUTH DAILY TO  PREVENT FLUID ACCUMULATION   METOPROLOL SUCCINATE (TOPROL-XL) 50 MG 24 HR TABLET    Take one tablet  with or immediately following a meal.   MULTIPLE VITAMINS-MINERALS (CENTRUM SILVER PO)    Take by mouth. Take one tablet once daily   POLYETHYLENE GLYCOL (MIRALAX / GLYCOLAX) PACKET    Take 17 g by mouth daily. Mix in 4-8oz beverage daily to relieve constipation. Reduce dose to every other day when stools become loose   RANITIDINE (ZANTAC) 75 MG TABLET    Take 75 mg by  mouth. Take one daily to treat reflux   RIVAROXABAN (XARELTO) 20 MG TABS TABLET    Take one tablet by mouth once daily For anticoagulation to prevent stroke  Modified Medications   No medications on file  Discontinued Medications   No medications on file     Physical Exam:  Filed Vitals:   03/19/14 1136  BP: 122/62  Pulse: 68  Temp: 98 F (36.7 C)  TempSrc: Oral   GENERAL APPEARANCE: No acute distress, appropriately groomed, Overweight body habitus. Alert, pleasant, conversant.  Skin: left 3rd finger with slight redness otherwise WNL HEENT unremarable NECK: Supple, full ROM. No thyroid tenderness, enlargement or nodule  LYMPHATICS: No head, neck or supraclavicular adenopathy  RESPIRATORY: Breathing is even, unlabored. Lung sounds are clear and full.  CARDIOVASCULAR: Heart IRRR. No murmur or extra heart sounds  EDEMA: 1+ ankle edema bilateral  GASTROINTESTINAL: Abdomen is rotund, soft, non-tender, not distended w/ normal bowel sounds.  MUSCULOSKELETAL:. Slow to stand, uses cane, no tenderness  PSYCHIATRIC: Mood and affect appropriate to situation       Labs reviewed: Basic Metabolic Panel:  Recent Labs  07/23/13  NA 143  K 4.0  BUN 29*  CREATININE 1.5*   Liver Function Tests: No results found for this basename: AST, ALT, ALKPHOS, BILITOT, PROT, ALBUMIN,  in the last 8760 hours No results found for this basename: LIPASE, AMYLASE,  in the last 8760 hours No results found for this basename: AMMONIA,  in the last 8760 hours CBC:  Recent Labs  07/23/13  WBC 4.9  HGB 14.2  HCT 41  PLT 162   Cardiac Enzymes: No results found for this basename: CKTOTAL, CKMB, CKMBINDEX, TROPONINI,  in the last 8760 hours BNP: No components found with this basename: POCBNP,  CBG: No results found for this basename: GLUCAP,  in the last 8760 hours TSH:  Recent Labs  07/23/13  TSH 6.14*    Assessment/Plan 1. Ingrown fingernail -improve, cont soaks until resolved  2.  Anxiety state, unspecified -cont zoloft with any adverse effects, follow up in 3-4 weeks

## 2014-03-21 ENCOUNTER — Other Ambulatory Visit: Payer: Self-pay | Admitting: *Deleted

## 2014-03-21 MED ORDER — SERTRALINE HCL 50 MG PO TABS: ORAL_TABLET | ORAL | Status: DC

## 2014-03-29 ENCOUNTER — Other Ambulatory Visit: Payer: Self-pay | Admitting: Geriatric Medicine

## 2014-04-09 ENCOUNTER — Encounter: Payer: Self-pay | Admitting: Nurse Practitioner

## 2014-04-09 ENCOUNTER — Non-Acute Institutional Stay: Payer: Medicare Other | Admitting: Nurse Practitioner

## 2014-04-09 VITALS — BP 100/56 | HR 64 | Wt 204.0 lb

## 2014-04-09 DIAGNOSIS — N183 Chronic kidney disease, stage 3 unspecified: Secondary | ICD-10-CM

## 2014-04-09 DIAGNOSIS — I4891 Unspecified atrial fibrillation: Secondary | ICD-10-CM

## 2014-04-09 DIAGNOSIS — I10 Essential (primary) hypertension: Secondary | ICD-10-CM | POA: Diagnosis not present

## 2014-04-09 DIAGNOSIS — I482 Chronic atrial fibrillation, unspecified: Secondary | ICD-10-CM

## 2014-04-09 DIAGNOSIS — F411 Generalized anxiety disorder: Secondary | ICD-10-CM | POA: Diagnosis not present

## 2014-04-09 MED ORDER — SERTRALINE HCL 50 MG PO TABS: ORAL_TABLET | ORAL | Status: DC

## 2014-04-09 NOTE — Progress Notes (Signed)
Patient ID: Gary [name], male   DOB: [birth year], 80 y.o.   MRN: [id number]    Nursing Home Location:  [place] of Service: Clinic (12)  PCP: Estill Dooms, MD  Allergies  Allergen Reactions  . Plavix [Clopidogrel Bisulfate]     indigestion    Chief Complaint  Patient presents with  . Medical Management of Chronic Issues    anxiety, follow-up on medication    HPI:  This is a 80 y.o. male resident of [facility], [facility] section seen today for follow up on zoloft. Does not notice any difference but reports he feel like he is benefiting from medication. No side effects noted. Still preparing for move which happens on Monday.    Review of Systems:  Review of Systems  Constitutional: Negative for fever, chills and malaise/fatigue.  Respiratory: Negative for shortness of breath.   Cardiovascular: Negative for chest pain.  Gastrointestinal: Negative for heartburn, abdominal pain, diarrhea and constipation.  Genitourinary: Negative for dysuria and urgency.  Musculoskeletal: Negative for myalgias.  Skin: Negative.   Neurological: Negative for tingling and sensory change.  Psychiatric/Behavioral:       Overall mood has improved, reports decrease in anxiety and grinding teeth less.       Past Medical History  Diagnosis Date  . Urinary frequency 10/01/2012  . Unspecified constipation 07/04/2012  . Irritable bowel syndrome 11/14/2011  . 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 Unspecified hereditary and idiopathic peripheral neuropathy 02/2010  . Pain in joint, lower leg 02/2010  . Insomnia with sleep apnea, unspecified 02/2010  . Abnormality of gait 02/2010  . Diarrhea 02/2010  . Long term (current) use of anticoagulants 11/2009  . Atrial fibrillation 11/2009  . Deviated nasal septum 11/2009  . Other dyspnea and respiratory abnormality 11/2009  . Other abnormal blood chemistry 02/09/2009  . Edema 2010  .  Dizziness and giddiness 2009  . Tension headache 2009  . Sebaceous cyst 2008  . Other B-complex deficiencies 2008  . Other malaise and fatigue 2007  . Essential and other specified forms of tremor 2007  . Unspecified essential hypertension 2007  . Reflux esophagitis 2007  . Diaphragmatic hernia without mention of obstruction or gangrene 2007  . Palpitations 2007  . Unspecified transient cerebral ischemia 2002  . Hypertrophy of prostate without urinary obstruction and other lower urinary tract symptoms (LUTS) 2002  . Undiagnosed cardiac murmurs 2002  . Anal and rectal polyp 1980  . Hyperglycemia 01/02/2013  . Chronic kidney disease, stage III (moderate) 08/07/2013   History reviewed. No pertinent past surgical history. Social History:   reports that he has never smoked. He has never used smokeless tobacco. He reports that he drinks alcohol. He reports that he does not use illicit drugs.  Family History  Problem Relation Age of Onset  . Congestive Heart Failure Mother   . Diabetes Father   . Heart disease Father   . Cancer Sister     lung     Medications: Patient's Medications  New Prescriptions   No medications on file  Previous Medications   ACETAMINOPHEN 500 MG COAPSULE    Take by mouth. Take 2 tablets once daily as needed   DOXAZOSIN (CARDURA) 4 MG TABLET    Take 4 mg by mouth daily. For prostate   FUROSEMIDE (LASIX) 20 MG TABLET    TAKE 1 TABLET DAILY (EDEMA)   METOPROLOL SUCCINATE (TOPROL-XL) 50 MG 24 HR TABLET  Take one tablet  with or immediately following a meal.   MULTIPLE VITAMINS-MINERALS (CENTRUM SILVER PO)    Take by mouth. Take one tablet once daily   POLYETHYLENE GLYCOL (MIRALAX / GLYCOLAX) PACKET    Take 17 g by mouth daily. Mix in 4-8oz beverage daily to relieve constipation. Reduce dose to every other day when stools become loose   RANITIDINE (ZANTAC) 75 MG TABLET    Take 75 mg by mouth. Take one daily to treat reflux   RIVAROXABAN (XARELTO) 20 MG TABS  TABLET    Take one tablet by mouth once daily For anticoagulation to prevent stroke   SERTRALINE (ZOLOFT) 50 MG TABLET    Take one tablet by mouth once daily for depression  Modified Medications   No medications on file  Discontinued Medications   No medications on file     Physical Exam:  Filed Vitals:   04/09/14 1147  BP: 100/56  Pulse: 64  Weight: 204 lb (92.534 kg)   GENERAL APPEARANCE: No acute distress, appropriately groomed. Alert, pleasant, conversant.  Skin: intact, no lesions or sore  HEENT unremarable NECK: Supple, full ROM. No thyroid tenderness, enlargement or nodule   RESPIRATORY: Breathing is even, unlabored. Lung sounds are clear and full.  CARDIOVASCULAR: Heart IRRR. No murmur or extra heart sounds  EDEMA: 1+ ankle edema bilateral  GASTROINTESTINAL: Abdomen is rotund, soft, non-tender, not distended w/ normal bowel sounds.  MUSCULOSKELETAL:. Slow to stand, uses cane, no tenderness  PSYCHIATRIC: Mood and affect appropriate to situation       Labs reviewed: Basic Metabolic Panel:  Recent Labs  07/23/13  NA 143  K 4.0  BUN 29*  CREATININE 1.5*   Liver Function Tests: No results found for this basename: AST, ALT, ALKPHOS, BILITOT, PROT, ALBUMIN,  in the last 8760 hours No results found for this basename: LIPASE, AMYLASE,  in the last 8760 hours No results found for this basename: AMMONIA,  in the last 8760 hours CBC:  Recent Labs  07/23/13  WBC 4.9  HGB 14.2  HCT 41  PLT 162   Cardiac Enzymes: No results found for this basename: CKTOTAL, CKMB, CKMBINDEX, TROPONINI,  in the last 8760 hours BNP: No components found with this basename: POCBNP,  CBG: No results found for this basename: GLUCAP,  in the last 8760 hours TSH:  Recent Labs  07/23/13  TSH 6.14*    Assessment/Plan  1. Anxiety state, unspecified -mood has improved, will cont zoloft for now - sertraline (ZOLOFT) 50 MG tablet; Take one tablet by mouth once daily for mood   Dispense: 30 tablet; Refill: 1  2. Chronic atrial fibrillation -rate controlled on current medication, conts xarelto, will follow up cbc before next visit  3. Unspecified essential hypertension -has been stable, will cont to monitor  4. Chronic kidney disease, stage III (moderate) -will follow up cmp with next labs

## 2014-05-21 ENCOUNTER — Non-Acute Institutional Stay: Payer: Medicare Other | Admitting: Nurse Practitioner

## 2014-05-21 ENCOUNTER — Encounter: Payer: Self-pay | Admitting: Nurse Practitioner

## 2014-05-21 VITALS — BP 134/62 | HR 60 | Temp 97.8°F | Ht 72.0 in | Wt 205.0 lb

## 2014-05-21 DIAGNOSIS — N183 Chronic kidney disease, stage 3 unspecified: Secondary | ICD-10-CM

## 2014-05-21 DIAGNOSIS — Z7901 Long term (current) use of anticoagulants: Secondary | ICD-10-CM

## 2014-05-21 DIAGNOSIS — I4891 Unspecified atrial fibrillation: Secondary | ICD-10-CM | POA: Diagnosis not present

## 2014-05-21 DIAGNOSIS — N400 Enlarged prostate without lower urinary tract symptoms: Secondary | ICD-10-CM | POA: Diagnosis not present

## 2014-05-21 DIAGNOSIS — I10 Essential (primary) hypertension: Secondary | ICD-10-CM

## 2014-05-21 DIAGNOSIS — I482 Chronic atrial fibrillation, unspecified: Secondary | ICD-10-CM

## 2014-05-21 NOTE — Progress Notes (Signed)
Patient ID: Gary [name], male   DOB: December 04, 1915, 80 y.o.   MRN: [id number]    Nursing Home Location:  [place] of Service: Clinic (12)  PCP: Estill Dooms, MD  Allergies  Allergen Reactions  . Plavix [Clopidogrel Bisulfate]     indigestion    Chief Complaint  Patient presents with  . Medical Management of Chronic Issues     blood pressure, A-Fib, hyperglycemia, CKD    HPI:  This is a 80 y.o. male resident of [facility] section seen today for routine follow up, does not wish to have comprehensive exam - reports "does not need one" Now has moved into his apartment from his [place] and enjoying that. Meeting new people.  Sleeping more. Likes to take naps when he is not doing things. Reads and spends times on the computer. Has a group of friends he meets for meals and beverages. Not exercising routinely- says "the heck with it" does not want to do it any more.  Really did not see much difference on Zoloft except he was grinding his teeth hurt now that he has cut it into half (plans to stop after he is done with medication)    Review of Systems:  Review of Systems  Constitutional: Negative for fever, chills and malaise/fatigue.  HENT: Negative for nosebleeds.   Respiratory: Negative for shortness of breath.   Cardiovascular: Positive for leg swelling (slight). Negative for chest pain.  Gastrointestinal: Negative for heartburn, abdominal pain, diarrhea, constipation and blood in stool.  Genitourinary: Negative for dysuria, urgency and hematuria.  Musculoskeletal: Positive for joint pain (ankle pain at night only ). Negative for myalgias.  Skin: Negative.   Neurological: Negative for tingling and sensory change.  Psychiatric/Behavioral: Negative for depression. The patient does not have insomnia.        Overall mood has improved, reports decrease in anxiety and grinding teeth less does not wish to cont zoloft.        Past Medical History  Diagnosis Date  . Urinary frequency 10/01/2012  . Unspecified constipation 07/04/2012  . Irritable bowel syndrome 11/14/2011  . 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 Unspecified hereditary and idiopathic peripheral neuropathy 02/2010  . Pain in joint, lower leg 02/2010  . Insomnia with sleep apnea, unspecified 02/2010  . Abnormality of gait 02/2010  . Diarrhea 02/2010  . Long term (current) use of anticoagulants 11/2009  . Atrial fibrillation 11/2009  . Deviated nasal septum 11/2009  . Other dyspnea and respiratory abnormality 11/2009  . Other abnormal blood chemistry 02/09/2009  . Edema 2010  . Dizziness and giddiness 2009  . Tension headache 2009  . Sebaceous cyst 2008  . Other B-complex deficiencies 2008  . Other malaise and fatigue 2007  . Essential and other specified forms of tremor 2007  . Unspecified essential hypertension 2007  . Reflux esophagitis 2007  . Diaphragmatic hernia without mention of obstruction or gangrene 2007  . Palpitations 2007  . Unspecified transient cerebral ischemia 2002  . Hypertrophy of prostate without urinary obstruction and other lower urinary tract symptoms (LUTS) 2002  . Undiagnosed cardiac murmurs 2002  . Anal and rectal polyp 1980  . Hyperglycemia 01/02/2013  . Chronic kidney disease, stage III (moderate) 08/07/2013   History reviewed. No pertinent past surgical history. Social History:   reports that he has never smoked. He has never used smokeless tobacco. He reports that he drinks alcohol. He reports that he does  not use illicit drugs.  Family History  Problem Relation Age of Onset  . Congestive Heart Failure Mother   . Diabetes Father   . Heart disease Father   . Cancer Sister     lung   . Kidney disease Sister     Medications: Patient's Medications  New Prescriptions   No medications on file  Previous Medications   ACETAMINOPHEN 500 MG COAPSULE    Take by mouth. Take 2 tablets once  daily as needed   DOXAZOSIN (CARDURA) 4 MG TABLET    Take 4 mg by mouth. Take 1/2 tablets daily for prostate   FUROSEMIDE (LASIX) 20 MG TABLET    TAKE 1 TABLET DAILY (EDEMA)   METOPROLOL SUCCINATE (TOPROL-XL) 50 MG 24 HR TABLET    Take one tablet  with or immediately following a meal.   MULTIPLE VITAMINS-MINERALS (CENTRUM SILVER PO)    Take by mouth. Take one tablet once daily   RANITIDINE (ZANTAC) 75 MG TABLET    Take 75 mg by mouth. Take one daily to treat reflux   RIVAROXABAN (XARELTO) 20 MG TABS TABLET    Take one tablet by mouth once daily For anticoagulation to prevent stroke  Modified Medications   Modified Medication Previous Medication   SERTRALINE (ZOLOFT) 50 MG TABLET sertraline (ZOLOFT) 50 MG tablet      Take one half  tablet by mouth once daily for mood    Take one tablet by mouth once daily for mood  Discontinued Medications   POLYETHYLENE GLYCOL (MIRALAX / GLYCOLAX) PACKET    Take 17 g by mouth daily. Mix in 4-8oz beverage daily to relieve constipation. Reduce dose to every other day when stools become loose     Physical Exam:  Filed Vitals:   05/21/14 1453  BP: 134/62  Pulse: 60  Temp: 97.8 F (36.6 C)  TempSrc: Oral  Height: 6' (1.829 m)  Weight: 205 lb (92.987 kg)   GENERAL APPEARANCE: No acute distress, appropriately groomed. Alert, pleasant, conversant.  Skin: intact, no lesions or sore  HEENT unremarable NECK: Supple, full ROM. No thyroid tenderness, enlargement or nodule   RESPIRATORY: Breathing is even, unlabored. Lung sounds are clear and full.  CARDIOVASCULAR: Heart IRRR. No murmur or extra heart sounds  EDEMA: 1+ ankle edema bilateral  GASTROINTESTINAL: Abdomen is rotund, soft, non-tender, not distended w/ normal bowel sounds.  MUSCULOSKELETAL:. Slow to stand, uses cane, no tenderness  PSYCHIATRIC: Mood and affect appropriate to situation       Labs reviewed: Basic Metabolic Panel:  Recent Labs  07/23/13  NA 143  K 4.0  BUN 29*  CREATININE  1.5*   Liver Function Tests: No results found for this basename: AST, ALT, ALKPHOS, BILITOT, PROT, ALBUMIN,  in the last 8760 hours No results found for this basename: LIPASE, AMYLASE,  in the last 8760 hours No results found for this basename: AMMONIA,  in the last 8760 hours CBC:  Recent Labs  07/23/13  WBC 4.9  HGB 14.2  HCT 41  PLT 162   Cardiac Enzymes: No results found for this basename: CKTOTAL, CKMB, CKMBINDEX, TROPONINI,  in the last 8760 hours BNP: No components found with this basename: POCBNP,  CBG: No results found for this basename: GLUCAP,  in the last 8760 hours TSH:  Recent Labs  07/23/13  TSH 6.14*    Assessment/Plan   1. Chronic atrial fibrillation Rate controlled, cont current medications as well as anticoagulant   2. Unspecified essential hypertension Well controlled on  current medications  3. Chronic kidney disease, stage III (moderate) -will follow up bmp  4. BPH (benign prostatic hyperplasia) -gets up several times at night, conts cardura   5. Long term (current) use of anticoagulants conts on xarelto, will follow up cbc  6. Edema Stable on lasix

## 2014-05-21 NOTE — Progress Notes (Signed)
Passed clock drawing 

## 2014-05-27 DIAGNOSIS — I4891 Unspecified atrial fibrillation: Secondary | ICD-10-CM | POA: Diagnosis not present

## 2014-05-27 DIAGNOSIS — I10 Essential (primary) hypertension: Secondary | ICD-10-CM | POA: Diagnosis not present

## 2014-05-27 DIAGNOSIS — R5381 Other malaise: Secondary | ICD-10-CM | POA: Diagnosis not present

## 2014-05-27 DIAGNOSIS — N183 Chronic kidney disease, stage 3 unspecified: Secondary | ICD-10-CM | POA: Diagnosis not present

## 2014-05-27 LAB — CBC AND DIFFERENTIAL
HEMATOCRIT: 40 % — AB (ref 41–53)
HEMOGLOBIN: 13.8 g/dL (ref 13.5–17.5)
Platelets: 146 10*3/uL — AB (ref 150–399)
WBC: 5 10*3/mL

## 2014-05-27 LAB — BASIC METABOLIC PANEL
BUN: 28 mg/dL — AB (ref 4–21)
Creatinine: 1.5 mg/dL — AB (ref 0.6–1.3)
GLUCOSE: 87 mg/dL
POTASSIUM: 4 mmol/L (ref 3.4–5.3)
Sodium: 142 mmol/L (ref 137–147)

## 2014-05-27 LAB — HEPATIC FUNCTION PANEL
ALT: 12 U/L (ref 10–40)
AST: 13 U/L — AB (ref 14–40)
Alkaline Phosphatase: 59 U/L (ref 25–125)
BILIRUBIN, TOTAL: 1 mg/dL

## 2014-05-27 LAB — TSH: TSH: 5.38 u[IU]/mL (ref 0.41–5.90)

## 2014-05-28 ENCOUNTER — Other Ambulatory Visit: Payer: Self-pay | Admitting: Geriatric Medicine

## 2014-06-03 ENCOUNTER — Telehealth: Payer: Self-pay

## 2014-06-03 ENCOUNTER — Other Ambulatory Visit: Payer: Self-pay

## 2014-06-03 NOTE — Telephone Encounter (Signed)
Called patient about his 05/27/14 lab, per [facility] lab was ok, no change in medications. Patient was good with this.

## 2014-06-09 ENCOUNTER — Encounter: Payer: Self-pay | Admitting: Internal Medicine

## 2014-06-18 ENCOUNTER — Other Ambulatory Visit: Payer: Self-pay

## 2014-06-18 DIAGNOSIS — Z23 Encounter for immunization: Secondary | ICD-10-CM | POA: Diagnosis not present

## 2014-06-18 MED ORDER — METOPROLOL SUCCINATE ER 50 MG PO TB24: ORAL_TABLET | ORAL | Status: DC

## 2014-08-09 ENCOUNTER — Other Ambulatory Visit: Payer: Self-pay | Admitting: Internal Medicine

## 2014-08-15 ENCOUNTER — Ambulatory Visit (INDEPENDENT_AMBULATORY_CARE_PROVIDER_SITE_OTHER): Payer: Medicare Other | Admitting: Internal Medicine

## 2014-08-15 ENCOUNTER — Encounter: Payer: Self-pay | Admitting: Internal Medicine

## 2014-08-15 VITALS — BP 140/70 | HR 88 | Temp 98.6°F | Resp 20 | Ht 72.0 in | Wt 204.0 lb

## 2014-08-15 DIAGNOSIS — I482 Chronic atrial fibrillation, unspecified: Secondary | ICD-10-CM

## 2014-08-15 DIAGNOSIS — J209 Acute bronchitis, unspecified: Secondary | ICD-10-CM

## 2014-08-15 DIAGNOSIS — R0602 Shortness of breath: Secondary | ICD-10-CM

## 2014-08-15 MED ORDER — BENZONATATE 100 MG PO CAPS: 100.0000 mg | ORAL_CAPSULE | Freq: Three times a day (TID) | ORAL | Status: DC | PRN

## 2014-08-15 MED ORDER — AZITHROMYCIN 250 MG PO TABS: ORAL_TABLET | ORAL | Status: DC

## 2014-08-15 NOTE — Progress Notes (Signed)
   Subjective:    Patient ID: Gary [name], male    DOB: Jul 24, 1916, 80 y.o.   MRN: [id number]  HPI He reports 8 day hx cough with min pdtn, shortness of breath, wheezing, HA, interruption of sleep, fever. No CP, new dizziness. No sick contacts. Tried tylenol OTC cold helped min. No help with mucinex. He reports URI usually only lasts 4 days but this time it has lasted longer. He lives at [facility]  Hx afib.   Review of Systems  Constitutional: Positive for fever (100.8 on yesterday), activity change and fatigue. Negative for chills, diaphoresis, appetite change and unexpected weight change.  HENT: Positive for congestion and sore throat. Negative for ear pain, postnasal drip, rhinorrhea, sinus pressure and sneezing.   Respiratory: Positive for cough, shortness of breath and wheezing. Negative for chest tightness.   Cardiovascular: Positive for leg swelling (chronic). Negative for chest pain.  Neurological: Positive for dizziness (chronic) and headaches.  All other systems reviewed and are negative.      Objective:   Physical Exam  Constitutional: He appears well-nourished. No distress.  Frail appearing  HENT:  Nose: Nose normal.  Mouth/Throat: Oropharynx is clear and moist. No oropharyngeal exudate.  Eyes: Conjunctivae are normal. Pupils are equal, round, and reactive to light.  Neck: Neck supple. No tracheal deviation present.  Cardiovascular: Normal rate and intact distal pulses.  Exam reveals friction rub. Exam reveals no gallop.   Murmur heard. Irregular irregular rhythm; B/l +1 pitting LE edema  Pulmonary/Chest: No stridor. He is in respiratory distress (minimum conversational dyspnea). He has wheezes (expiratory). He has rales (R>L base).  Musculoskeletal:  Unsteady gait; uses cane to ambulate  Lymphadenopathy:    He has no cervical adenopathy.  Skin: Skin is warm and dry.  Psychiatric: He has a normal mood and affect. His behavior is normal.  Vitals  reviewed.         Assessment & Plan:     ICD-9-CM ICD-10-CM   1. Acute bronchitis, unspecified organism 466.0 J20.9 azithromycin (ZITHROMAX) 250 MG tablet     benzonatate (TESSALON) 100 MG capsule  2. Chronic atrial fibrillation 427.31 I48.2   3. Shortness of breath 786.05 R06.02     - Rx abx Zpak due to age, mild hypoxia and SOB. - Rx tessalon perles for cough - No steroid or HFA Rx due to potential HR elevation and age of pt.  -obtain CXR if no better in 72 hrs. He declined today. - push fluids and rest

## 2014-09-29 ENCOUNTER — Encounter: Payer: Self-pay | Admitting: Internal Medicine

## 2014-09-30 ENCOUNTER — Non-Acute Institutional Stay: Payer: Medicare Other | Admitting: Internal Medicine

## 2014-09-30 ENCOUNTER — Encounter: Payer: Self-pay | Admitting: Internal Medicine

## 2014-09-30 VITALS — BP 146/68 | HR 62 | Temp 97.5°F | Wt 206.0 lb

## 2014-09-30 DIAGNOSIS — N183 Chronic kidney disease, stage 3 unspecified: Secondary | ICD-10-CM

## 2014-09-30 DIAGNOSIS — N400 Enlarged prostate without lower urinary tract symptoms: Secondary | ICD-10-CM

## 2014-09-30 DIAGNOSIS — I482 Chronic atrial fibrillation, unspecified: Secondary | ICD-10-CM

## 2014-09-30 DIAGNOSIS — I10 Essential (primary) hypertension: Secondary | ICD-10-CM | POA: Diagnosis not present

## 2014-09-30 DIAGNOSIS — Z7901 Long term (current) use of anticoagulants: Secondary | ICD-10-CM

## 2014-09-30 DIAGNOSIS — R739 Hyperglycemia, unspecified: Secondary | ICD-10-CM | POA: Diagnosis not present

## 2014-09-30 NOTE — Progress Notes (Signed)
Patient ID: Gary [name], male   DOB: 08-15-1916, 81 y.o.   MRN: [id number]   Location:  [facility]  Code Status: DNR, reviewed goals of care today  Allergies  Allergen Reactions  . Plavix [Clopidogrel Bisulfate]     indigestion    Chief Complaint  Patient presents with  . Medical Management of Chronic Issues    blood pressure, A-Fib, CKD, hyperglycemia    HPI: Patient is a 81 y.o. white male seen in the office today for med mgt of chronic diseases.   Says he feels good and is happy.  Oxygen content was 91% today.  Denies coughing or dyspnea, but does not exercise much.  Has afib.  Feels he has to take bigger breaths when he feels he's in afib--happens typically at night in bed.  Happens about 1-2x per week.  He rode over on his scooter.  He can walk, but has balance problems and painful arthritis of the knees.  Left knee has loss of medial meniscus (had surgically removed).  It bothers him more than the right. More bothersome at night.     Notes he takes two naps in the daytime after breakfast and lunch.    He is from [place] originally but lived here for 60 years.  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  Has been loved by two women dearly over the course of his lifetime.   Does have three daughters and a son, as well.      Review of Systems:  Review of Systems  Constitutional: Negative for weight loss.       Wt stable for a long time  HENT: Positive for hearing loss.        Wears hearing aides--almost entirely deaf w/o hearing aides  Eyes:       Uses computer a lot and reads a lot--eyes bother him if he reads too much--will sometimes read novel into the night; does see ophthalmologist at [facility]  Respiratory: Positive for shortness of breath. Negative for cough and sputum production.        Edema comes and goes, not too bad today  Cardiovascular: Positive for chest pain,  palpitations and leg swelling.       Very short-lived--less than a minute chest pain in right chest  Gastrointestinal: Negative for abdominal pain, diarrhea, constipation, blood in stool and melena.       Admits to flatus  Genitourinary: Positive for frequency. Negative for urgency.       Some nocturia--may be awake for an hour or so before he falls asleep again  Musculoskeletal: Positive for joint pain. Negative for falls.       Bilateral knees  Skin: Negative for rash.       Has skin tag on left jaw that he will go to derm in a couple of wks to have removed  Neurological: Negative for dizziness and loss of consciousness.       Balance is poor  Psychiatric/Behavioral: Negative for depression and memory loss. The patient does not have insomnia.      Past Medical History  Diagnosis Date  . Urinary frequency 10/01/2012  . Unspecified constipation 07/04/2012  . Irritable bowel syndrome 11/14/2011  . 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 Unspecified hereditary and idiopathic peripheral neuropathy 02/2010  . Pain in joint, lower leg 02/2010  . Insomnia with sleep apnea, unspecified 02/2010  .  Abnormality of gait 02/2010  . Diarrhea 02/2010  . Long term (current) use of anticoagulants 11/2009  . Atrial fibrillation 11/2009  . Deviated nasal septum 11/2009  . Other dyspnea and respiratory abnormality 11/2009  . Other abnormal blood chemistry 02/09/2009  . Edema 2010  . Dizziness and giddiness 2009  . Tension headache 2009  . Sebaceous cyst 2008  . Other B-complex deficiencies 2008  . Other malaise and fatigue 2007  . Essential and other specified forms of tremor 2007  . Unspecified essential hypertension 2007  . Reflux esophagitis 2007  . Diaphragmatic hernia without mention of obstruction or gangrene 2007  . Palpitations 2007  . Unspecified transient cerebral ischemia 2002  . Hypertrophy of prostate without urinary obstruction and other lower urinary tract symptoms (LUTS)  2002  . Undiagnosed cardiac murmurs 2002  . Anal and rectal polyp 1980  . Hyperglycemia 01/02/2013  . Chronic kidney disease, stage III (moderate) 08/07/2013    History reviewed. No pertinent past surgical history.  Social History:   reports that he has never smoked. He has never used smokeless tobacco. He reports that he drinks alcohol. He reports that he does not use illicit drugs.  Family History  Problem Relation Age of Onset  . Congestive Heart Failure Mother   . Diabetes Father   . Heart disease Father   . Cancer Sister     lung   . Kidney disease Sister     Medications: Patient's Medications  New Prescriptions   No medications on file  Previous Medications   ACETAMINOPHEN 500 MG COAPSULE    Take by mouth. Take 2 tablets once daily as needed   DOXAZOSIN (CARDURA) 4 MG TABLET    Take 4 mg by mouth. Take 1/2 tablets daily for prostate   FUROSEMIDE (LASIX) 20 MG TABLET    TAKE 1 TABLET DAILY (EDEMA)   METOPROLOL SUCCINATE (TOPROL-XL) 50 MG 24 HR TABLET    Take one tablet  with or immediately following a meal.   MULTIPLE VITAMINS-MINERALS (CENTRUM SILVER PO)    Take by mouth. Take one tablet once daily   RANITIDINE (ZANTAC) 75 MG TABLET    Take 75 mg by mouth. Take one daily to treat reflux   RIVAROXABAN (XARELTO) 20 MG TABS TABLET    Take one tablet by mouth once daily For anticoagulation to prevent stroke  Modified Medications   No medications on file  Discontinued Medications   AZITHROMYCIN (ZITHROMAX) 250 MG TABLET    Take 2 tablets on day 1 then take 1 tablet daily x 4 days   BENZONATATE (TESSALON) 100 MG CAPSULE    Take 1 capsule (100 mg total) by mouth 3 (three) times daily as needed for cough.     Physical Exam: Filed Vitals:   09/30/14 1553  BP: 146/68  Pulse: 62  Temp: 97.5 F (36.4 C)  TempSrc: Oral  Weight: 206 lb (93.441 kg)  SpO2: 91%  Physical Exam  Constitutional: He is oriented to person, place, and time. He appears well-developed and  well-nourished. No distress.  HENT:  Head: Normocephalic and atraumatic.  Right Ear: External ear normal.  Left Ear: External ear normal.  Nose: Nose normal.  Mouth/Throat: Oropharynx is clear and moist.  Eyes: Conjunctivae and EOM are normal. Pupils are equal, round, and reactive to light.  glasses  Neck: Normal range of motion. Neck supple. No JVD present. No thyromegaly present.  Cardiovascular: Intact distal pulses.   irreg irreg  Pulmonary/Chest: Effort normal and breath  sounds normal. No respiratory distress.  Abdominal: Soft. Bowel sounds are normal. He exhibits no distension and no mass. There is no tenderness.  Musculoskeletal: Normal range of motion. He exhibits no edema or tenderness.  Uses scooter for long distances and cane for short distances  Neurological: He is alert and oriented to person, place, and time. He has normal reflexes.  Skin: Skin is warm and dry.  Multiple seborrheic and actinic keratoses of face, chest, back, arms  Psychiatric: He has a normal mood and affect. His behavior is normal. Judgment and thought content normal.     Labs reviewed: Basic Metabolic Panel:  Recent Labs  05/27/14  NA 142  K 4.0  BUN 28*  CREATININE 1.5*  TSH 5.38   Liver Function Tests:  Recent Labs  05/27/14  AST 13*  ALT 12  ALKPHOS 59   No results for input(s): LIPASE, AMYLASE in the last 8760 hours. No results for input(s): AMMONIA in the last 8760 hours. CBC:  Recent Labs  05/27/14  WBC 5.0  HGB 13.8  HCT 40*  PLT 146*    Assessment/Plan 1. Chronic atrial fibrillation -cont toprol xl 50mg  daily, xarelto 20mg  daily -rate controlled and on chronic anticoagulation  2. Essential hypertension, benign -bp at goal with toprol, lasix and cardura  3. Chronic kidney disease, stage III (moderate) -cont lasix to help with diuresis, has been stable, f/u bmp before next visit  4. BPH (benign prostatic hyperplasia) -check psa due to progression of symptoms and  at his request -cont cardura  5. Current use of long term anticoagulation -cont on xarelto for afib  6. Hyperglycemia -f/u hba1c, at 96, his diet can be fairly liberal despite this concern, monitor  Labs/tests ordered:  Cbc, bmp, psa, hba1c Next appt:  6 mos  Sharell Hilmer L. Jayme Cham, D.O. [facility] [address], [place] [address] Cell Phone (Mon-Fri 8am-5pm):  [phone] On Call:  [phone] & follow prompts after 5pm & weekends Office Phone:  [phone] Office Fax:  [phone]

## 2014-10-08 ENCOUNTER — Encounter: Payer: Self-pay | Admitting: Internal Medicine

## 2014-10-15 DIAGNOSIS — C4441 Basal cell carcinoma of skin of scalp and neck: Secondary | ICD-10-CM | POA: Diagnosis not present

## 2014-10-15 DIAGNOSIS — L820 Inflamed seborrheic keratosis: Secondary | ICD-10-CM | POA: Diagnosis not present

## 2014-10-15 DIAGNOSIS — C44311 Basal cell carcinoma of skin of nose: Secondary | ICD-10-CM | POA: Diagnosis not present

## 2014-10-15 DIAGNOSIS — D044 Carcinoma in situ of skin of scalp and neck: Secondary | ICD-10-CM | POA: Diagnosis not present

## 2014-10-15 DIAGNOSIS — D485 Neoplasm of uncertain behavior of skin: Secondary | ICD-10-CM | POA: Diagnosis not present

## 2014-10-15 DIAGNOSIS — B078 Other viral warts: Secondary | ICD-10-CM | POA: Diagnosis not present

## 2014-11-05 DIAGNOSIS — Z85828 Personal history of other malignant neoplasm of skin: Secondary | ICD-10-CM | POA: Diagnosis not present

## 2014-11-05 DIAGNOSIS — L853 Xerosis cutis: Secondary | ICD-10-CM | POA: Diagnosis not present

## 2014-11-05 DIAGNOSIS — D044 Carcinoma in situ of skin of scalp and neck: Secondary | ICD-10-CM | POA: Diagnosis not present

## 2014-11-12 DIAGNOSIS — C4441 Basal cell carcinoma of skin of scalp and neck: Secondary | ICD-10-CM | POA: Diagnosis not present

## 2014-11-12 DIAGNOSIS — Z85828 Personal history of other malignant neoplasm of skin: Secondary | ICD-10-CM | POA: Diagnosis not present

## 2014-11-16 ENCOUNTER — Other Ambulatory Visit: Payer: Self-pay | Admitting: Internal Medicine

## 2014-12-04 ENCOUNTER — Other Ambulatory Visit: Payer: Self-pay | Admitting: Internal Medicine

## 2014-12-23 DIAGNOSIS — R7309 Other abnormal glucose: Secondary | ICD-10-CM | POA: Diagnosis not present

## 2014-12-23 DIAGNOSIS — N183 Chronic kidney disease, stage 3 (moderate): Secondary | ICD-10-CM | POA: Diagnosis not present

## 2014-12-23 DIAGNOSIS — I10 Essential (primary) hypertension: Secondary | ICD-10-CM | POA: Diagnosis not present

## 2014-12-23 DIAGNOSIS — I4891 Unspecified atrial fibrillation: Secondary | ICD-10-CM | POA: Diagnosis not present

## 2014-12-23 LAB — CBC AND DIFFERENTIAL
HCT: 40 % — AB (ref 41–53)
Hemoglobin: 13.5 g/dL (ref 13.5–17.5)
Platelets: 111 10*3/uL — AB (ref 150–399)
WBC: 3.2 10^3/mL

## 2014-12-23 LAB — BASIC METABOLIC PANEL
BUN: 25 mg/dL — AB (ref 4–21)
Creatinine: 1.5 mg/dL — AB (ref 0.6–1.3)
GLUCOSE: 107 mg/dL
Potassium: 4.3 mmol/L (ref 3.4–5.3)
SODIUM: 140 mmol/L (ref 137–147)

## 2014-12-23 LAB — TSH: TSH: 5.03 u[IU]/mL (ref 0.41–5.90)

## 2014-12-25 ENCOUNTER — Encounter: Payer: Self-pay | Admitting: Nurse Practitioner

## 2014-12-25 ENCOUNTER — Encounter: Payer: Self-pay | Admitting: Adult Health

## 2014-12-25 ENCOUNTER — Non-Acute Institutional Stay (SKILLED_NURSING_FACILITY): Payer: Medicare Other | Admitting: Adult Health

## 2014-12-25 DIAGNOSIS — I10 Essential (primary) hypertension: Secondary | ICD-10-CM

## 2014-12-25 DIAGNOSIS — R531 Weakness: Secondary | ICD-10-CM | POA: Diagnosis not present

## 2014-12-25 DIAGNOSIS — R109 Unspecified abdominal pain: Secondary | ICD-10-CM | POA: Diagnosis not present

## 2014-12-25 DIAGNOSIS — N400 Enlarged prostate without lower urinary tract symptoms: Secondary | ICD-10-CM | POA: Diagnosis not present

## 2014-12-25 DIAGNOSIS — I482 Chronic atrial fibrillation, unspecified: Secondary | ICD-10-CM

## 2014-12-25 DIAGNOSIS — N183 Chronic kidney disease, stage 3 (moderate): Secondary | ICD-10-CM | POA: Diagnosis not present

## 2014-12-25 DIAGNOSIS — R140 Abdominal distension (gaseous): Secondary | ICD-10-CM | POA: Diagnosis not present

## 2014-12-25 DIAGNOSIS — R05 Cough: Secondary | ICD-10-CM | POA: Diagnosis not present

## 2014-12-25 DIAGNOSIS — K5901 Slow transit constipation: Secondary | ICD-10-CM

## 2014-12-25 DIAGNOSIS — I4891 Unspecified atrial fibrillation: Secondary | ICD-10-CM | POA: Diagnosis not present

## 2014-12-25 DIAGNOSIS — R059 Cough, unspecified: Secondary | ICD-10-CM

## 2014-12-25 DIAGNOSIS — Z7901 Long term (current) use of anticoagulants: Secondary | ICD-10-CM

## 2014-12-25 DIAGNOSIS — J189 Pneumonia, unspecified organism: Secondary | ICD-10-CM | POA: Diagnosis not present

## 2014-12-25 DIAGNOSIS — K5900 Constipation, unspecified: Secondary | ICD-10-CM | POA: Insufficient documentation

## 2014-12-25 LAB — BASIC METABOLIC PANEL
BUN: 22 mg/dL — AB (ref 4–21)
Creatinine: 1.3 mg/dL (ref 0.6–1.3)
Glucose: 128 mg/dL
Potassium: 4 mmol/L (ref 3.4–5.3)
Sodium: 140 mmol/L (ref 137–147)

## 2014-12-25 LAB — CBC AND DIFFERENTIAL
HCT: 39 % — AB (ref 41–53)
Hemoglobin: 13.4 g/dL — AB (ref 13.5–17.5)
PLATELETS: 97 10*3/uL — AB (ref 150–399)
WBC: 5.4 10*3/mL

## 2014-12-25 NOTE — Progress Notes (Signed)
Patient ID: Gary [name], male   DOB: 1916-08-16, 81 y.o.   MRN: [id number]   Nursing Home Location:  [facility]   Code Status: DNR   Place of Service: SNF (31)  Chief Complaint  Patient presents with  . Acute Visit    sore throat, weakness    HPI:  81 y.o.male residing at [name], rehab section. I was asked to see him today for an admission to rehab. He was complaining of a sore throat, cough, congestion, and weakness. The sore throat has been present for 3 months. He though it was allergies and did not seek treatment, however, he denies a hx of allergies. He has had some pain with swallowing as well. He has a congestion cough and reports some SOB at night x1. He denies increased edema in his legs or CP. He is sitting comfortably for our visit. He has never smoked.  He also reports that his abd is distended. He has had two normal BMs today but has periods of fecal urgency at baseline. He does not have a fever. He denies urinary symptoms He also reports feeling weak and feels that he can not live by himself right now and so he was moved to rehab for monitoring, therapy, and treatment. He reports that he has lived a long life and is quite content. He is not interested in aggressive measures.      Review of Systems:  Review of Systems  Constitutional: Positive for activity change, appetite change and fatigue. Negative for fever, chills and unexpected weight change.  HENT: Positive for congestion, rhinorrhea, sore throat and trouble swallowing.   Respiratory: Positive for cough and shortness of breath. Negative for wheezing.   Cardiovascular: Positive for leg swelling. Negative for chest pain and palpitations.  Gastrointestinal: Positive for abdominal distention. Negative for abdominal pain and constipation.  Genitourinary: Negative for dysuria and flank pain.       Has urgency at times at baseline  Musculoskeletal: Positive for gait  problem. Negative for arthralgias.       Uses a scooter  Neurological: Negative for dizziness, seizures, facial asymmetry and speech difficulty.  Psychiatric/Behavioral: Negative for behavioral problems and agitation.    Medications: Patient's Medications  New Prescriptions   No medications on file  Previous Medications   ACETAMINOPHEN 500 MG COAPSULE    Take by mouth. Take 2 tablets once daily as needed   DOXAZOSIN (CARDURA) 4 MG TABLET    Take 4 mg by mouth. Take 1/2 tablets daily for prostate   FUROSEMIDE (LASIX) 20 MG TABLET    TAKE 1 TABLET DAILY FOR EDEMA   METOPROLOL SUCCINATE (TOPROL-XL) 50 MG 24 HR TABLET    Take one tablet  with or immediately following a meal.   MULTIPLE VITAMINS-MINERALS (CENTRUM SILVER PO)    Take by mouth. Take one tablet once daily   POLYETHYLENE GLYCOL (MIRALAX / GLYCOLAX) PACKET    Take 17 g by mouth daily.   RANITIDINE (ZANTAC) 75 MG TABLET    Take 75 mg by mouth. Take one daily to treat reflux   XARELTO 20 MG TABS TABLET    TAKE 1 TABLET ONCE DAILY FOR ANTICOAGULATION TO PREVENT STROKE  Modified Medications   No medications on file  Discontinued Medications   No medications on file     Physical Exam:  Filed Vitals:   12/25/14 1559  BP: 166/90  Pulse: 71  Temp: 98.6 F (37 C)  Resp: 20  Weight: 207 lb (  93.895 kg)  SpO2: 96%    Physical Exam  Constitutional: He is oriented to person, place, and time. No distress.  HENT:  Head: Normocephalic and atraumatic.  Nose: Mucosal edema and rhinorrhea present. Right sinus exhibits no maxillary sinus tenderness and no frontal sinus tenderness. Left sinus exhibits no maxillary sinus tenderness and no frontal sinus tenderness.  Mouth/Throat: Mucous membranes are normal. Oropharyngeal exudate present.  Yellow drainage noted   Eyes: Conjunctivae and EOM are normal. Pupils are equal, round, and reactive to light. Right eye exhibits no discharge. Left eye exhibits no discharge.  Neck: No JVD present. No  tracheal deviation present. No thyromegaly present.  Cardiovascular:  Irregular rhythm. +1 BLE edema  Pulmonary/Chest: Effort normal. No respiratory distress. He has no wheezes.  Bilat rhonchi  Abdominal: Soft. Bowel sounds are normal. He exhibits distension. There is no tenderness.  Scar to the RUQ.  ?hernia   Musculoskeletal: He exhibits no edema or tenderness.  Resting tremor to both hands  Lymphadenopathy:    He has cervical adenopathy.  Neurological: He is alert and oriented to person, place, and time. No cranial nerve deficit.  Skin: Skin is warm and dry. He is not diaphoretic. No erythema.  Psychiatric: Affect normal.    Labs reviewed/Significant Diagnostic Results:  Basic Metabolic Panel:  Recent Labs  05/27/14  NA 142  K 4.0  BUN 28*  CREATININE 1.5*   Liver Function Tests:  Recent Labs  05/27/14  AST 13*  ALT 12  ALKPHOS 59   No results for input(s): LIPASE, AMYLASE in the last 8760 hours. No results for input(s): AMMONIA in the last 8760 hours. CBC:  Recent Labs  05/27/14  WBC 5.0  HGB 13.8  HCT 40*  PLT 146*   CBG: No results for input(s): GLUCAP in the last 8760 hours. TSH:  Recent Labs  05/27/14  TSH 5.38      Assessment/Plan  1. Cough -Robitussin 10 cc TID  -Duoneb TID for 48rs and q6 hr sprn -PCXR r/o pna -Due to his prolonged symptoms and exam findings will begin Levaquin 500mg  qd for 7 days with Florastor 1 cap BID for 7 days -Consider ST   2. Sore throat This is most likely due to #1.  We will continue the Zantac and consider a PPI if symptoms persists. He is not inclined to pursue an aggressive workup if his symptoms do not resolve with our treatment.  3. Weakness Begin PT/OT. He will need to stay in rehab to regain strength prior to returning to his apt. His weakness is general and most likely due to #1  4. Abdominal distention -?Hernia, obtain abd xray  -he is comfortable, in no acute distress and is having normal  BMs  5. Chronic atrial fibrillation Rate controlled with BB. Continue Xarelto for CVA prevention  6. Current use of long term anticoagulation Stable, no new events. See above  7. Essential hypertension, benign Bp slightly elevated today, continue to monitor. Keep legs elevated, daily weights  8. BPH (benign prostatic hyperplasia) Denies urinary symptoms, continue current med  9. Slow transit constipation Reports two BMs today. Continue Miralax  LABS: CMP, CBC  Cindi Carbon, ANP [facility] [phone]

## 2014-12-26 DIAGNOSIS — R278 Other lack of coordination: Secondary | ICD-10-CM | POA: Diagnosis not present

## 2014-12-26 DIAGNOSIS — N183 Chronic kidney disease, stage 3 (moderate): Secondary | ICD-10-CM | POA: Diagnosis not present

## 2014-12-26 DIAGNOSIS — R2681 Unsteadiness on feet: Secondary | ICD-10-CM | POA: Diagnosis not present

## 2014-12-26 DIAGNOSIS — M6281 Muscle weakness (generalized): Secondary | ICD-10-CM | POA: Diagnosis not present

## 2014-12-29 DIAGNOSIS — R2681 Unsteadiness on feet: Secondary | ICD-10-CM | POA: Diagnosis not present

## 2014-12-29 DIAGNOSIS — M6281 Muscle weakness (generalized): Secondary | ICD-10-CM | POA: Diagnosis not present

## 2014-12-29 DIAGNOSIS — R278 Other lack of coordination: Secondary | ICD-10-CM | POA: Diagnosis not present

## 2014-12-29 DIAGNOSIS — N183 Chronic kidney disease, stage 3 (moderate): Secondary | ICD-10-CM | POA: Diagnosis not present

## 2014-12-29 NOTE — Progress Notes (Signed)
This encounter was created in error - please disregard.  This encounter was created in error - please disregard.

## 2014-12-30 ENCOUNTER — Non-Acute Institutional Stay (SKILLED_NURSING_FACILITY): Payer: Medicare Other | Admitting: Internal Medicine

## 2014-12-30 ENCOUNTER — Ambulatory Visit
Admission: RE | Admit: 2014-12-30 | Discharge: 2014-12-30 | Disposition: A | Discharge status: Home / Self Care | Payer: Medicare Other | Source: Ambulatory Visit | Attending: Internal Medicine | Admitting: Internal Medicine

## 2014-12-30 ENCOUNTER — Other Ambulatory Visit: Payer: Self-pay | Admitting: Internal Medicine

## 2014-12-30 ENCOUNTER — Encounter: Payer: Self-pay | Admitting: Internal Medicine

## 2014-12-30 DIAGNOSIS — N183 Chronic kidney disease, stage 3 unspecified: Secondary | ICD-10-CM

## 2014-12-30 DIAGNOSIS — R05 Cough: Secondary | ICD-10-CM | POA: Diagnosis not present

## 2014-12-30 DIAGNOSIS — R059 Cough, unspecified: Secondary | ICD-10-CM

## 2014-12-30 DIAGNOSIS — I482 Chronic atrial fibrillation, unspecified: Secondary | ICD-10-CM

## 2014-12-30 DIAGNOSIS — R634 Abnormal weight loss: Secondary | ICD-10-CM | POA: Diagnosis not present

## 2014-12-30 DIAGNOSIS — I10 Essential (primary) hypertension: Secondary | ICD-10-CM | POA: Diagnosis not present

## 2014-12-30 DIAGNOSIS — I5032 Chronic diastolic (congestive) heart failure: Secondary | ICD-10-CM | POA: Diagnosis not present

## 2014-12-30 DIAGNOSIS — R609 Edema, unspecified: Secondary | ICD-10-CM | POA: Diagnosis not present

## 2014-12-30 DIAGNOSIS — R531 Weakness: Secondary | ICD-10-CM

## 2014-12-30 NOTE — Progress Notes (Signed)
Patient ID: Gary [name], male   DOB: 04-01-1916, 81 y.o.   MRN: [id number]  Provider:  Rexene Edison. Mariea Clonts, D.O., C.M.D. Location:  [facility]   PCP: Hollace Kinnier, DO  Code Status: DNR Goals of Care:  Advanced Directive information Does patient have an advance directive?: Yes, Type of Advance Directive: Living will;Healthcare Power of [place];Out of facility DNR (pink MOST or yellow form), Pre-existing out of facility DNR order (yellow form or pink MOST form): Yellow form placed in chart (order not valid for inpatient use), Does patient want to make changes to advanced directive?: No - Patient declined   Allergies  Allergen Reactions  . Plavix [Clopidogrel Bisulfate]     indigestion    Chief Complaint  Patient presents with  . New Admit To SNF    3 mos of sore throat, cough, congestion, worse still in past 10 days when got more confused, off balance and not steady even while using his cane    HPI: 81 y.o. male with h/o paroxysmal afib on xarelto, CKDII, htn, hyperglycemia, tremor seen for admission to rehab due to weakness, sore throat, difficulty swallowing pills, episodes of afib at night, edema of his feet, cough, congestion, bilateral rhonchi.  He was admitted here 4/21 and started on levaquin antibiotic for presumed pneumonia but normal portable CXR.  He feels a bit more sharp than he did at admission, but had no recollection of my colleague seeing him last week.  Today, he still admits to some GI upset, not sleeping well and ongoing cough, congestion, weakness (sore throat better).  He tells me about 3 wks of edema in his legs which is now better (weight has come down 6 lbs since 4/21 also).    ROS: Review of Systems  Constitutional: Positive for weight loss and malaise/fatigue. Negative for fever and chills.  HENT: Positive for congestion and sore throat.   Respiratory: Positive for cough, sputum production and shortness of breath.   Cardiovascular: Negative for chest  pain.  Gastrointestinal: Negative for abdominal pain and constipation.  Genitourinary: Negative for dysuria.  Musculoskeletal: Negative for falls.  Skin: Negative for rash.  Neurological: Positive for weakness. Negative for dizziness, sensory change, speech change, focal weakness and loss of consciousness.       Unsteady on feet  Psychiatric/Behavioral: Negative for depression.       Confusion     Past Medical History  Diagnosis Date  . Urinary frequency 10/01/2012  . Unspecified constipation 07/04/2012  . Irritable bowel syndrome 11/14/2011  . 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 Unspecified hereditary and idiopathic peripheral neuropathy 02/2010  . Pain in joint, lower leg 02/2010  . Insomnia with sleep apnea, unspecified 02/2010  . Abnormality of gait 02/2010  . Diarrhea 02/2010  . Long term (current) use of anticoagulants 11/2009  . Atrial fibrillation 11/2009  . Deviated nasal septum 11/2009  . Other dyspnea and respiratory abnormality 11/2009  . Other abnormal blood chemistry 02/09/2009  . Edema 2010  . Dizziness and giddiness 2009  . Tension headache 2009  . Sebaceous cyst 2008  . Other B-complex deficiencies 2008  . Other malaise and fatigue 2007  . Essential and other specified forms of tremor 2007  . Unspecified essential hypertension 2007  . Reflux esophagitis 2007  . Diaphragmatic hernia without mention of obstruction or gangrene 2007  . Palpitations 2007  . Unspecified transient cerebral ischemia 2002  . Hypertrophy of prostate without urinary obstruction and other lower urinary tract symptoms (  LUTS) 2002  . Undiagnosed cardiac murmurs 2002  . Anal and rectal polyp 1980  . Hyperglycemia 01/02/2013  . Chronic kidney disease, stage III (moderate) 08/07/2013   No past surgical history on file. Social History:   reports that he has never smoked. He has never used smokeless tobacco. He reports that he drinks alcohol. He reports that he does not use illicit  drugs.  Family History  Problem Relation Age of Onset  . Congestive Heart Failure Mother   . Diabetes Father   . Heart disease Father   . Cancer Sister     lung   . Kidney disease Sister     Medications: Patient's Medications  New Prescriptions   No medications on file  Previous Medications   ACETAMINOPHEN 500 MG COAPSULE    Take by mouth. Take 2 tablets once daily as needed   DOXAZOSIN (CARDURA) 4 MG TABLET    Take 4 mg by mouth. Take 1/2 tablets daily for prostate   FUROSEMIDE (LASIX) 20 MG TABLET    TAKE 1 TABLET DAILY FOR EDEMA   METOPROLOL SUCCINATE (TOPROL-XL) 50 MG 24 HR TABLET    Take one tablet  with or immediately following a meal.   MULTIPLE VITAMINS-MINERALS (CENTRUM SILVER PO)    Take by mouth. Take one tablet once daily   POLYETHYLENE GLYCOL (MIRALAX / GLYCOLAX) PACKET    Take 17 g by mouth daily.   RANITIDINE (ZANTAC) 75 MG TABLET    Take 75 mg by mouth. Take one daily to treat reflux   XARELTO 20 MG TABS TABLET    TAKE 1 TABLET ONCE DAILY FOR ANTICOAGULATION TO PREVENT STROKE  Modified Medications   No medications on file  Discontinued Medications   No medications on file     Physical Exam: Filed Vitals:   12/30/14 1327  BP: 139/75  Pulse: 61  Temp: 97.4 F (36.3 C)  Resp: 24  Height: 6\' 1"  (1.854 m)  Weight: 201 lb (91.173 kg)  SpO2: 96%  Physical Exam  Constitutional: He is oriented to person, place, and time. He appears well-developed and well-nourished. No distress.  Seated in recliner chair  HENT:  Head: Normocephalic and atraumatic.  Eyes: EOM are normal. Pupils are equal, round, and reactive to light.  glasses  Neck: Neck supple. No JVD present.  Cardiovascular: Intact distal pulses.   No edema; irreg irreg at present  Pulmonary/Chest: Effort normal. No respiratory distress.  Diffuse, coarse, wet rhonchi bilateral lung fields  Abdominal: Soft. Bowel sounds are normal. He exhibits distension.  Pt says "I just have a fat belly"    Musculoskeletal: Normal range of motion.  Feels weak in thighs  Neurological: He is alert and oriented to person, place, and time.  Skin: Skin is warm and dry.  Psychiatric: He has a normal mood and affect. His behavior is normal.     Labs reviewed: Basic Metabolic Panel:  Recent Labs  05/27/14 12/25/14  NA 142 140  K 4.0 4.0  BUN 28* 22*  CREATININE 1.5* 1.3   Liver Function Tests:  Recent Labs  05/27/14  AST 13*  ALT 12  ALKPHOS 59   No results for input(s): LIPASE, AMYLASE in the last 8760 hours. No results for input(s): AMMONIA in the last 8760 hours. CBC:  Recent Labs  05/27/14 12/25/14  WBC 5.0 5.4  HGB 13.8 13.4*  HCT 40* 39*  PLT 146* 97*   Cardiac Enzymes: No results for input(s): CKTOTAL, CKMB, CKMBINDEX, TROPONINI in the  last 8760 hours. BNP: Invalid input(s): POCBNP CBG: No results for input(s): GLUCAP in the last 8760 hours.  Imaging and Procedures: pcxr 4/21/6 cardiomegaly, no acute pulmonary pathology abd xray 12/25/14:  No acute abdominal pathology  Assessment/Plan 1. Cough -felt to be due to acute bronchitis -check xray at imaging center to check for chf due to history of recent edema and labs  -improving gradually with strength, overall condition, appetite, but lungs still sound poor -cont to monitor  2. Weakness -improving slowly with therapy and better po intake, treatment of infection -cont PT  3. Loss of weight -story he told me suggests this was fluid weight most of which he's already lost since coming here and eating the rehab food instead of his home selections -due to persistent wet rhonchorous sounds bilaterally,  Will consider treating with a 3 day course of lasix bid and increased potassium and will reassess  4. Essential hypertension, benign -bp elevated on  Occasions when reviewed, but others have been at goal so no changes needed to this regimen -he mentions that his medication had been reduced by Claudette, NP, in  clinic a while back b/c his pressures were actually running low  5. Chronic atrial fibrillation -cont toprol xl, xarelto for this  -CHADS2-vasc is 5 (all except he's not diabetic)  6. Chronic kidney disease, stage III (moderate) -avoid nsaids, f/u bmp  Functional status:  Feels too weak to ambulate independently at present so requiring adl assistance except with meals  Family/ staff Communication: discussed with nurse manager  Labs/tests ordered:  F/u cbc, bmp, bnp stat today, PA and lateral CXR at [facility]. Talik Casique, D.O. [facility] [address] [place], [place] [address] Cell Phone (Mon-Fri 8am-5pm):  [phone] On Call:  [phone] & follow prompts after 5pm & weekends Office Phone:  [phone] Office Fax:  [phone]

## 2014-12-31 ENCOUNTER — Encounter: Payer: Self-pay | Admitting: Nurse Practitioner

## 2014-12-31 DIAGNOSIS — R278 Other lack of coordination: Secondary | ICD-10-CM | POA: Diagnosis not present

## 2014-12-31 DIAGNOSIS — M6281 Muscle weakness (generalized): Secondary | ICD-10-CM | POA: Diagnosis not present

## 2014-12-31 DIAGNOSIS — R2681 Unsteadiness on feet: Secondary | ICD-10-CM | POA: Diagnosis not present

## 2014-12-31 DIAGNOSIS — N183 Chronic kidney disease, stage 3 (moderate): Secondary | ICD-10-CM | POA: Diagnosis not present

## 2015-01-01 DIAGNOSIS — R2681 Unsteadiness on feet: Secondary | ICD-10-CM | POA: Diagnosis not present

## 2015-01-01 DIAGNOSIS — R278 Other lack of coordination: Secondary | ICD-10-CM | POA: Diagnosis not present

## 2015-01-01 DIAGNOSIS — M6281 Muscle weakness (generalized): Secondary | ICD-10-CM | POA: Diagnosis not present

## 2015-01-01 DIAGNOSIS — N183 Chronic kidney disease, stage 3 (moderate): Secondary | ICD-10-CM | POA: Diagnosis not present

## 2015-01-02 DIAGNOSIS — R2681 Unsteadiness on feet: Secondary | ICD-10-CM | POA: Diagnosis not present

## 2015-01-02 DIAGNOSIS — I5032 Chronic diastolic (congestive) heart failure: Secondary | ICD-10-CM | POA: Diagnosis not present

## 2015-01-02 DIAGNOSIS — R278 Other lack of coordination: Secondary | ICD-10-CM | POA: Diagnosis not present

## 2015-01-02 DIAGNOSIS — M6281 Muscle weakness (generalized): Secondary | ICD-10-CM | POA: Diagnosis not present

## 2015-01-02 DIAGNOSIS — R609 Edema, unspecified: Secondary | ICD-10-CM | POA: Diagnosis not present

## 2015-01-02 DIAGNOSIS — N183 Chronic kidney disease, stage 3 (moderate): Secondary | ICD-10-CM | POA: Diagnosis not present

## 2015-01-02 LAB — BASIC METABOLIC PANEL
BUN: 30 mg/dL — AB (ref 4–21)
Creatinine: 1.5 mg/dL — AB (ref 0.6–1.3)
GLUCOSE: 107 mg/dL
Potassium: 4.1 mmol/L (ref 3.4–5.3)
SODIUM: 141 mmol/L (ref 137–147)

## 2015-01-04 DIAGNOSIS — M6281 Muscle weakness (generalized): Secondary | ICD-10-CM | POA: Diagnosis not present

## 2015-01-04 DIAGNOSIS — N183 Chronic kidney disease, stage 3 (moderate): Secondary | ICD-10-CM | POA: Diagnosis not present

## 2015-01-04 DIAGNOSIS — G3184 Mild cognitive impairment, so stated: Secondary | ICD-10-CM | POA: Diagnosis not present

## 2015-01-04 DIAGNOSIS — R488 Other symbolic dysfunctions: Secondary | ICD-10-CM | POA: Diagnosis not present

## 2015-01-04 DIAGNOSIS — R278 Other lack of coordination: Secondary | ICD-10-CM | POA: Diagnosis not present

## 2015-01-04 DIAGNOSIS — R2681 Unsteadiness on feet: Secondary | ICD-10-CM | POA: Diagnosis not present

## 2015-01-04 DIAGNOSIS — R1313 Dysphagia, pharyngeal phase: Secondary | ICD-10-CM | POA: Diagnosis not present

## 2015-01-05 ENCOUNTER — Non-Acute Institutional Stay (SKILLED_NURSING_FACILITY): Payer: Medicare Other | Admitting: Adult Health

## 2015-01-05 DIAGNOSIS — R05 Cough: Secondary | ICD-10-CM

## 2015-01-05 DIAGNOSIS — R488 Other symbolic dysfunctions: Secondary | ICD-10-CM | POA: Diagnosis not present

## 2015-01-05 DIAGNOSIS — R1313 Dysphagia, pharyngeal phase: Secondary | ICD-10-CM | POA: Diagnosis not present

## 2015-01-05 DIAGNOSIS — R635 Abnormal weight gain: Secondary | ICD-10-CM

## 2015-01-05 DIAGNOSIS — G3184 Mild cognitive impairment, so stated: Secondary | ICD-10-CM | POA: Diagnosis not present

## 2015-01-05 DIAGNOSIS — R059 Cough, unspecified: Secondary | ICD-10-CM

## 2015-01-05 DIAGNOSIS — K219 Gastro-esophageal reflux disease without esophagitis: Secondary | ICD-10-CM

## 2015-01-05 DIAGNOSIS — R2681 Unsteadiness on feet: Secondary | ICD-10-CM | POA: Diagnosis not present

## 2015-01-05 DIAGNOSIS — M6281 Muscle weakness (generalized): Secondary | ICD-10-CM | POA: Diagnosis not present

## 2015-01-05 DIAGNOSIS — N183 Chronic kidney disease, stage 3 (moderate): Secondary | ICD-10-CM | POA: Diagnosis not present

## 2015-01-05 NOTE — Progress Notes (Signed)
Patient ID: Gary [name], male   DOB: 06-02-1916, 81 y.o.   MRN: [id number]   Nursing Home Location:  [facility]   Code Status: DNR   Place of Service: SNF (31)  Chief Complaint  Patient presents with  . Acute Visit    f/u cough, weight gain    HPI:  81 y.o.male residing at [name], rehab section. I was asked to see him due to weight gain. He was admitted to rehab on 4/22 and started on Levaquin for a possible pneumonia. His CXR returned normal despite weakness, sore throat, cough, and congestion. He was sent out for a PA and lateral view of his chest which also returned normal. There was concern for CHF and he was diuresed on 4/26.  He lost 11 lbs over a week but then today was back up to 204 lbs (an 8 lb gain in a matter of two days). He denies SOB and chest pain. He notes that his cough is still present and he is still congested but overall there has been improvement. Therapy reports he is making gains and gaining strength. The resident reports that his legs are smaller and questions his weight gain. He is currently followed by speech and is possibly going for a swallow eval.  He denies any issues swallowing food.      Review of Systems:  Review of Systems  Constitutional: Positive for fatigue. Negative for fever, chills, activity change, appetite change and unexpected weight change.  HENT: Positive for congestion. Negative for rhinorrhea, sore throat and trouble swallowing.   Respiratory: Positive for cough. Negative for shortness of breath and wheezing.   Cardiovascular: Positive for leg swelling. Negative for chest pain and palpitations.  Gastrointestinal: Negative for abdominal pain, constipation and abdominal distention.  Genitourinary:       Has urgency at times at baseline  Musculoskeletal: Positive for gait problem. Negative for arthralgias.       Uses a scooter  Neurological: Negative for dizziness, seizures, facial asymmetry  and speech difficulty.  Psychiatric/Behavioral: Negative for behavioral problems and agitation.    Medications: Patient's Medications  New Prescriptions   No medications on file  Previous Medications   ACETAMINOPHEN 500 MG COAPSULE    Take by mouth. Take 2 tablets once daily as needed   DOXAZOSIN (CARDURA) 4 MG TABLET    Take 4 mg by mouth. Take 1/2 tablets daily for prostate   FUROSEMIDE (LASIX) 20 MG TABLET    TAKE 1 TABLET DAILY FOR EDEMA   METOPROLOL SUCCINATE (TOPROL-XL) 50 MG 24 HR TABLET    Take one tablet  with or immediately following a meal.   MULTIPLE VITAMINS-MINERALS (CENTRUM SILVER PO)    Take by mouth. Take one tablet once daily   POLYETHYLENE GLYCOL (MIRALAX / GLYCOLAX) PACKET    Take 17 g by mouth daily.   RANITIDINE (ZANTAC) 75 MG TABLET    Take 75 mg by mouth. Take one daily to treat reflux   XARELTO 20 MG TABS TABLET    TAKE 1 TABLET ONCE DAILY FOR ANTICOAGULATION TO PREVENT STROKE  Modified Medications   No medications on file  Discontinued Medications   No medications on file     Physical Exam:  There were no vitals filed for this visit.  Physical Exam  Constitutional: He is oriented to person, place, and time. No distress.  HENT:  Mouth/Throat: Mucous membranes are normal.  Eyes: Conjunctivae and EOM are normal. Pupils are equal, round, and reactive  to light. Right eye exhibits no discharge. Left eye exhibits no discharge.  Cardiovascular: Normal rate and regular rhythm.   Irregular rhythm. Trace edema bilat  Pulmonary/Chest: Effort normal. No respiratory distress. He has no wheezes.  Rhonchi noted on the right, but improved  Abdominal: Soft. Bowel sounds are normal. He exhibits no distension. There is no tenderness.  Scar to the RUQ.  ?hernia   Musculoskeletal: He exhibits no edema or tenderness.  Resting tremor to both hands  Neurological: He is alert and oriented to person, place, and time. No cranial nerve deficit.  Skin: Skin is warm and dry. He  is not diaphoretic. No erythema.  Psychiatric: Affect normal.    Labs reviewed/Significant Diagnostic Results:  Basic Metabolic Panel:  Recent Labs  05/27/14 12/25/14  NA 142 140  K 4.0 4.0  BUN 28* 22*  CREATININE 1.5* 1.3   Liver Function Tests:  Recent Labs  05/27/14  AST 13*  ALT 12  ALKPHOS 59   No results for input(s): LIPASE, AMYLASE in the last 8760 hours. No results for input(s): AMMONIA in the last 8760 hours. CBC:  Recent Labs  05/27/14 12/25/14  WBC 5.0 5.4  HGB 13.8 13.4*  HCT 40* 39*  PLT 146* 97*   CBG: No results for input(s): GLUCAP in the last 8760 hours. TSH:  Recent Labs  05/27/14  TSH 5.38   12/30/14 BNP 263.4   Assessment/Plan  1) Cough Persistent Mucinex DM 2 tabs p.o. BID. Add Pulmicort and Perforomist BID for 5 days. Flutter valve TID.  Also a modified barium swallow to eval his swallowing function/esophageal dysmotility  2) GERD Increase zantac to BID  3) Weight gain He is well diuresed by my exam, showing decreased edema in his lungs. I would not increase the lasix at this time but continue to monitor the weights for a more consistent trend.    Cindi Carbon, ANP [facility] [phone]

## 2015-01-06 ENCOUNTER — Other Ambulatory Visit (HOSPITAL_COMMUNITY): Payer: Self-pay | Admitting: Internal Medicine

## 2015-01-06 DIAGNOSIS — G3184 Mild cognitive impairment, so stated: Secondary | ICD-10-CM | POA: Diagnosis not present

## 2015-01-06 DIAGNOSIS — R1314 Dysphagia, pharyngoesophageal phase: Secondary | ICD-10-CM

## 2015-01-06 DIAGNOSIS — M6281 Muscle weakness (generalized): Secondary | ICD-10-CM | POA: Diagnosis not present

## 2015-01-06 DIAGNOSIS — N183 Chronic kidney disease, stage 3 (moderate): Secondary | ICD-10-CM | POA: Diagnosis not present

## 2015-01-06 DIAGNOSIS — R1313 Dysphagia, pharyngeal phase: Secondary | ICD-10-CM | POA: Diagnosis not present

## 2015-01-06 DIAGNOSIS — R2681 Unsteadiness on feet: Secondary | ICD-10-CM | POA: Diagnosis not present

## 2015-01-06 DIAGNOSIS — R488 Other symbolic dysfunctions: Secondary | ICD-10-CM | POA: Diagnosis not present

## 2015-01-07 ENCOUNTER — Encounter: Payer: Self-pay | Admitting: Internal Medicine

## 2015-01-07 ENCOUNTER — Non-Acute Institutional Stay (SKILLED_NURSING_FACILITY): Payer: Medicare Other | Admitting: Internal Medicine

## 2015-01-07 DIAGNOSIS — R05 Cough: Secondary | ICD-10-CM | POA: Diagnosis not present

## 2015-01-07 DIAGNOSIS — R488 Other symbolic dysfunctions: Secondary | ICD-10-CM | POA: Diagnosis not present

## 2015-01-07 DIAGNOSIS — R195 Other fecal abnormalities: Secondary | ICD-10-CM | POA: Diagnosis not present

## 2015-01-07 DIAGNOSIS — R1313 Dysphagia, pharyngeal phase: Secondary | ICD-10-CM | POA: Diagnosis not present

## 2015-01-07 DIAGNOSIS — G3184 Mild cognitive impairment, so stated: Secondary | ICD-10-CM | POA: Diagnosis not present

## 2015-01-07 DIAGNOSIS — R1030 Lower abdominal pain, unspecified: Secondary | ICD-10-CM

## 2015-01-07 DIAGNOSIS — M6281 Muscle weakness (generalized): Secondary | ICD-10-CM | POA: Diagnosis not present

## 2015-01-07 DIAGNOSIS — N183 Chronic kidney disease, stage 3 (moderate): Secondary | ICD-10-CM | POA: Diagnosis not present

## 2015-01-07 DIAGNOSIS — R059 Cough, unspecified: Secondary | ICD-10-CM

## 2015-01-07 DIAGNOSIS — R2681 Unsteadiness on feet: Secondary | ICD-10-CM | POA: Diagnosis not present

## 2015-01-07 NOTE — Progress Notes (Signed)
Patient ID: Gary [name], male   DOB: July 01, 1916, 81 y.o.   MRN: [id number]  Location:  [facility] Provider:  Rexene Edison. Mariea Clonts, D.O., C.M.D.  Code Status:  DNR Goals of care: Advanced Directive information Does patient have an advance directive?: Yes, Type of Advance Directive: Living will;Out of facility DNR (pink MOST or yellow form), Pre-existing out of facility DNR order (yellow form or pink MOST form): Yellow form placed in chart (order not valid for inpatient use), Does patient want to make changes to advanced directive?: No - Patient declined   Chief Complaint  Patient presents with  . Acute Visit    lower abdominal pain, two episodes of loose stool this morning, resident says he felt like he did when he had diverticulitis    HPI:  81 yo white male here for rehab was sen for acute visit due to lower abdominal pain and two episodes of loose stool this am.  He has had diverticulitis in the past and wondered if that was what was going on.  He's been afebrile but takes tylenol regularly.  He has eaten most of his lunch.  He feels a lot better.  We discussed letting staff know if his loose stools persist or he does develop chills or fever.  His abdominal pain has dissipated.  His congestion and cough have continued to improve (treated for acute bronchitis and suspected chf).  Review of Systems:  Review of Systems  Constitutional: Positive for malaise/fatigue. Negative for fever and chills.  HENT: Negative for congestion.   Respiratory: Negative for shortness of breath.   Cardiovascular: Negative for chest pain.  Gastrointestinal: Positive for abdominal pain and diarrhea.  Genitourinary: Negative for dysuria.  Musculoskeletal: Negative for falls.  Neurological: Positive for weakness.    Medications: Patient's Medications  New Prescriptions   No medications on file  Previous Medications   ACETAMINOPHEN 500 MG COAPSULE    Take by mouth. Take 2 tablets once daily as needed   DOXAZOSIN (CARDURA) 4 MG TABLET    Take 4 mg by mouth. Take 1/2 tablets daily for prostate   FUROSEMIDE (LASIX) 20 MG TABLET    TAKE 1 TABLET DAILY FOR EDEMA   METOPROLOL SUCCINATE (TOPROL-XL) 50 MG 24 HR TABLET    Take one tablet  with or immediately following a meal.   MULTIPLE VITAMINS-MINERALS (CENTRUM SILVER PO)    Take by mouth. Take one tablet once daily   POLYETHYLENE GLYCOL (MIRALAX / GLYCOLAX) PACKET    Take 17 g by mouth daily.   RANITIDINE (ZANTAC) 75 MG TABLET    Take 75 mg by mouth. Take one daily to treat reflux   XARELTO 20 MG TABS TABLET    TAKE 1 TABLET ONCE DAILY FOR ANTICOAGULATION TO PREVENT STROKE  Modified Medications   No medications on file  Discontinued Medications   No medications on file    Physical Exam: Filed Vitals:   01/07/15 1506  BP: 147/67  Pulse: 75  Temp: 97.7 F (36.5 C)  Resp: 20  Height: 6\' 1"  (1.854 m)  Weight: 203 lb 12.8 oz (92.443 kg)  SpO2: 96%  Physical Exam  Constitutional: He is oriented to person, place, and time. He appears well-developed and well-nourished. No distress.  Cardiovascular: Normal rate, regular rhythm, normal heart sounds and intact distal pulses.   Pulmonary/Chest: Effort normal.  Few rhonchi now only in upper lung fields and clear with coughing  Abdominal: Soft. Bowel sounds are normal. He exhibits no distension and  no mass. There is no tenderness.  Neurological: He is alert and oriented to person, place, and time.    Labs reviewed: Basic Metabolic Panel:  Recent Labs  05/27/14 12/25/14  NA 142 140  K 4.0 4.0  BUN 28* 22*  CREATININE 1.5* 1.3    Liver Function Tests:  Recent Labs  05/27/14  AST 13*  ALT 12  ALKPHOS 59    CBC:  Recent Labs  05/27/14 12/25/14  WBC 5.0 5.4  HGB 13.8 13.4*  HCT 40* 39*  PLT 146* 97*     Assessment/Plan 1. Lower abdominal pain -seems this was just this am and dissipated after his second loose stool -cont to monitor for more pain, loose stools, fever,  chills -if these occur, would check cdiff toxin due to neighbor with this, and begin flagyl just after stool sent off (would treat both diverticulitis and cdiff), probably would hold off on use of cipro unless he had a fever or a cT showing the diverticultiis  2. Loose stools -see above  3. Cough -has improved after completing abx for acute bronchitis and 3 day course of double lasix for volume overload   Family/ staff Communication: discussed with dayshift nurse and nurse manager  Labs/tests ordered:  No new labs  Theresa Wedel L. Jezel Basto, D.O. [facility] [address], [place] [address] Cell Phone (Mon-Fri 8am-5pm):  [phone] On Call:  [phone] & follow prompts after 5pm & weekends Office Phone:  [phone] Office Fax:  [phone]

## 2015-01-08 ENCOUNTER — Other Ambulatory Visit: Payer: Self-pay

## 2015-01-08 DIAGNOSIS — G3184 Mild cognitive impairment, so stated: Secondary | ICD-10-CM | POA: Diagnosis not present

## 2015-01-08 DIAGNOSIS — R1313 Dysphagia, pharyngeal phase: Secondary | ICD-10-CM | POA: Diagnosis not present

## 2015-01-08 DIAGNOSIS — N183 Chronic kidney disease, stage 3 (moderate): Secondary | ICD-10-CM | POA: Diagnosis not present

## 2015-01-08 DIAGNOSIS — M6281 Muscle weakness (generalized): Secondary | ICD-10-CM | POA: Diagnosis not present

## 2015-01-08 DIAGNOSIS — R2681 Unsteadiness on feet: Secondary | ICD-10-CM | POA: Diagnosis not present

## 2015-01-08 DIAGNOSIS — R488 Other symbolic dysfunctions: Secondary | ICD-10-CM | POA: Diagnosis not present

## 2015-01-09 ENCOUNTER — Ambulatory Visit (HOSPITAL_COMMUNITY)
Admission: RE | Admit: 2015-01-09 | Discharge: 2015-01-09 | Disposition: A | Discharge status: Home / Self Care | Payer: Medicare Other | Source: Ambulatory Visit | Attending: Internal Medicine | Admitting: Internal Medicine

## 2015-01-09 DIAGNOSIS — K219 Gastro-esophageal reflux disease without esophagitis: Secondary | ICD-10-CM | POA: Insufficient documentation

## 2015-01-09 DIAGNOSIS — R2681 Unsteadiness on feet: Secondary | ICD-10-CM | POA: Diagnosis not present

## 2015-01-09 DIAGNOSIS — Z7902 Long term (current) use of antithrombotics/antiplatelets: Secondary | ICD-10-CM | POA: Diagnosis not present

## 2015-01-09 DIAGNOSIS — R488 Other symbolic dysfunctions: Secondary | ICD-10-CM | POA: Diagnosis not present

## 2015-01-09 DIAGNOSIS — M6281 Muscle weakness (generalized): Secondary | ICD-10-CM | POA: Diagnosis not present

## 2015-01-09 DIAGNOSIS — R05 Cough: Secondary | ICD-10-CM | POA: Diagnosis not present

## 2015-01-09 DIAGNOSIS — Z79899 Other long term (current) drug therapy: Secondary | ICD-10-CM | POA: Insufficient documentation

## 2015-01-09 DIAGNOSIS — R1313 Dysphagia, pharyngeal phase: Secondary | ICD-10-CM | POA: Diagnosis not present

## 2015-01-09 DIAGNOSIS — R1314 Dysphagia, pharyngoesophageal phase: Secondary | ICD-10-CM | POA: Diagnosis not present

## 2015-01-09 DIAGNOSIS — R1310 Dysphagia, unspecified: Secondary | ICD-10-CM | POA: Insufficient documentation

## 2015-01-09 DIAGNOSIS — G3184 Mild cognitive impairment, so stated: Secondary | ICD-10-CM | POA: Diagnosis not present

## 2015-01-09 DIAGNOSIS — R0989 Other specified symptoms and signs involving the circulatory and respiratory systems: Secondary | ICD-10-CM | POA: Insufficient documentation

## 2015-01-09 DIAGNOSIS — N183 Chronic kidney disease, stage 3 (moderate): Secondary | ICD-10-CM | POA: Diagnosis not present

## 2015-01-09 DIAGNOSIS — R635 Abnormal weight gain: Secondary | ICD-10-CM | POA: Diagnosis not present

## 2015-01-12 DIAGNOSIS — R488 Other symbolic dysfunctions: Secondary | ICD-10-CM | POA: Diagnosis not present

## 2015-01-12 DIAGNOSIS — G3184 Mild cognitive impairment, so stated: Secondary | ICD-10-CM | POA: Diagnosis not present

## 2015-01-12 DIAGNOSIS — R1313 Dysphagia, pharyngeal phase: Secondary | ICD-10-CM | POA: Diagnosis not present

## 2015-01-12 DIAGNOSIS — N183 Chronic kidney disease, stage 3 (moderate): Secondary | ICD-10-CM | POA: Diagnosis not present

## 2015-01-12 DIAGNOSIS — R2681 Unsteadiness on feet: Secondary | ICD-10-CM | POA: Diagnosis not present

## 2015-01-12 DIAGNOSIS — M6281 Muscle weakness (generalized): Secondary | ICD-10-CM | POA: Diagnosis not present

## 2015-01-13 ENCOUNTER — Non-Acute Institutional Stay (SKILLED_NURSING_FACILITY): Payer: Medicare Other | Admitting: Adult Health

## 2015-01-13 ENCOUNTER — Encounter: Payer: Self-pay | Admitting: Adult Health

## 2015-01-13 DIAGNOSIS — M6281 Muscle weakness (generalized): Secondary | ICD-10-CM | POA: Diagnosis not present

## 2015-01-13 DIAGNOSIS — R488 Other symbolic dysfunctions: Secondary | ICD-10-CM | POA: Diagnosis not present

## 2015-01-13 DIAGNOSIS — G3184 Mild cognitive impairment, so stated: Secondary | ICD-10-CM | POA: Diagnosis not present

## 2015-01-13 DIAGNOSIS — I482 Chronic atrial fibrillation, unspecified: Secondary | ICD-10-CM

## 2015-01-13 DIAGNOSIS — I10 Essential (primary) hypertension: Secondary | ICD-10-CM

## 2015-01-13 DIAGNOSIS — J209 Acute bronchitis, unspecified: Secondary | ICD-10-CM

## 2015-01-13 DIAGNOSIS — N183 Chronic kidney disease, stage 3 unspecified: Secondary | ICD-10-CM

## 2015-01-13 DIAGNOSIS — K5901 Slow transit constipation: Secondary | ICD-10-CM | POA: Diagnosis not present

## 2015-01-13 DIAGNOSIS — R531 Weakness: Secondary | ICD-10-CM

## 2015-01-13 DIAGNOSIS — Z7901 Long term (current) use of anticoagulants: Secondary | ICD-10-CM

## 2015-01-13 DIAGNOSIS — R2681 Unsteadiness on feet: Secondary | ICD-10-CM | POA: Diagnosis not present

## 2015-01-13 DIAGNOSIS — R1313 Dysphagia, pharyngeal phase: Secondary | ICD-10-CM | POA: Diagnosis not present

## 2015-01-13 NOTE — Progress Notes (Signed)
Patient ID: Gary [name], male   DOB: 21-Jan-1916, 81 y.o.   MRN: [id number]   Code Status: DNR Goals of Care:  Advanced Directive information   [name]  Allergies  Allergen Reactions  . Plavix [Clopidogrel Bisulfate]     indigestion    Chief Complaint  Patient presents with  . Acute Visit  . Discharge Note    HPI: 81 y.o. male with h/o paroxysmal afib on xarelto, CKDII, htn, hyperglycemia,BPH, and tremor. He was admitted to rehab after an episode of bronchitis treated with Levaquin for 7 days. There was also some concern for CHF due to increased edema, adventitious BS, and an elevated BNP of 236. He was given Lasix 20mg  BID for 3 days. He continued with a congested cough. He was started on Pulmicort and Perforomist nebs and has improved significantly. He denies any cough, sputum production, and congestion. He has received therapy for weakness and is now walking with a walker. He had some confusion on admission but this has resolved. He is going for a home assessment today to return to his independent living apt. He had a swallow study which showed no signs of aspiration during his stay.  ROS: Review of Systems  Constitutional: Negative for fever, chills, weight loss and malaise/fatigue.  HENT: Negative for congestion and sore throat.   Respiratory: Negative for cough, sputum production, shortness of breath and wheezing.   Cardiovascular: Positive for leg swelling. Negative for chest pain and palpitations.  Gastrointestinal: Negative for heartburn, abdominal pain and constipation.  Genitourinary: Negative for dysuria.  Neurological: Negative for dizziness, tingling and tremors.  Psychiatric/Behavioral: Negative for depression.     Past Medical History  Diagnosis Date  . Urinary frequency 10/01/2012  . Unspecified constipation 07/04/2012  . Irritable bowel syndrome 11/14/2011  . 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 Unspecified  hereditary and idiopathic peripheral neuropathy 02/2010  . Pain in joint, lower leg 02/2010  . Insomnia with sleep apnea, unspecified 02/2010  . Abnormality of gait 02/2010  . Diarrhea 02/2010  . Long term (current) use of anticoagulants 11/2009  . Atrial fibrillation 11/2009  . Deviated nasal septum 11/2009  . Other dyspnea and respiratory abnormality 11/2009  . Other abnormal blood chemistry 02/09/2009  . Edema 2010  . Dizziness and giddiness 2009  . Tension headache 2009  . Sebaceous cyst 2008  . Other B-complex deficiencies 2008  . Other malaise and fatigue 2007  . Essential and other specified forms of tremor 2007  . Unspecified essential hypertension 2007  . Reflux esophagitis 2007  . Diaphragmatic hernia without mention of obstruction or gangrene 2007  . Palpitations 2007  . Unspecified transient cerebral ischemia 2002  . Hypertrophy of prostate without urinary obstruction and other lower urinary tract symptoms (LUTS) 2002  . Undiagnosed cardiac murmurs 2002  . Anal and rectal polyp 1980  . Hyperglycemia 01/02/2013  . Chronic kidney disease, stage III (moderate) 08/07/2013   History reviewed. No pertinent past surgical history. Social History:   reports that he has never smoked. He has never used smokeless tobacco. He reports that he drinks alcohol. He reports that he does not use illicit drugs.  Family History  Problem Relation Age of Onset  . Congestive Heart Failure Mother   . Diabetes Father   . Heart disease Father   . Cancer Sister     lung   . Kidney disease Sister     Medications: Patient's Medications  New Prescriptions  No medications on file  Previous Medications   ACETAMINOPHEN 500 MG COAPSULE    Take by mouth. Take 2 tablets once daily as needed   B COMPLEX VITAMINS (B COMPLEX-B12 PO)    Take by mouth daily.   DOXAZOSIN (CARDURA) 4 MG TABLET    Take 4 mg by mouth. Take 1/2 tablets daily for prostate   FUROSEMIDE (LASIX) 20 MG TABLET    TAKE 1 TABLET DAILY FOR  EDEMA   METOPROLOL SUCCINATE (TOPROL-XL) 50 MG 24 HR TABLET    Take one tablet  with or immediately following a meal.   MULTIPLE VITAMINS-MINERALS (CENTRUM SILVER PO)    Take by mouth. Take one tablet once daily   POLYETHYLENE GLYCOL (MIRALAX / GLYCOLAX) PACKET    Take 17 g by mouth daily.   RANITIDINE (ZANTAC) 75 MG TABLET    Take 75 mg by mouth. Take one daily to treat reflux   XARELTO 20 MG TABS TABLET    TAKE 1 TABLET ONCE DAILY FOR ANTICOAGULATION TO PREVENT STROKE  Modified Medications   No medications on file  Discontinued Medications   No medications on file     Physical Exam: Filed Vitals:   01/13/15 1027  BP: 105/58  Pulse: 56  Temp: 96.9 F (36.1 C)  Resp: 18  Weight: 204 lb 3.2 oz (92.625 kg)  SpO2: 95%  Physical Exam  Constitutional: He is oriented to person, place, and time. He appears well-developed and well-nourished. No distress.  Seated in recliner chair  HENT:  Head: Normocephalic and atraumatic.  Eyes: EOM are normal. Pupils are equal, round, and reactive to light.  glasses  Neck: Neck supple. No JVD present.  Cardiovascular: Intact distal pulses.   No edema; irreg irreg at present  Pulmonary/Chest: Effort normal and breath sounds normal. No respiratory distress.  Abdominal: Soft. Bowel sounds are normal. He exhibits no distension.  Musculoskeletal: Normal range of motion. He exhibits no edema or tenderness.  Neurological: He is alert and oriented to person, place, and time.  Skin: Skin is warm and dry.  Psychiatric: He has a normal mood and affect. His behavior is normal.     Labs reviewed: Basic Metabolic Panel:  Recent Labs  12/23/14 12/25/14 01/02/15  NA 140 140 141  K 4.3 4.0 4.1  BUN 25* 22* 30*  CREATININE 1.5* 1.3 1.5*   Liver Function Tests:  Recent Labs  05/27/14  AST 13*  ALT 12  ALKPHOS 59   No results for input(s): LIPASE, AMYLASE in the last 8760 hours. No results for input(s): AMMONIA in the last 8760  hours. CBC:  Recent Labs  05/27/14 12/23/14 12/25/14  WBC 5.0 3.2 5.4  HGB 13.8 13.5 13.4*  HCT 40* 40* 39*  PLT 146* 111* 97*   Cardiac Enzymes: No results for input(s): CKTOTAL, CKMB, CKMBINDEX, TROPONINI in the last 8760 hours. BNP: Invalid input(s): POCBNP CBG: No results for input(s): GLUCAP in the last 8760 hours.  Imaging and Procedures: pcxr 4/21/6 cardiomegaly, no acute pulmonary pathology abd xray 12/25/14:  No acute abdominal pathology  Assessment/Plan  1. Acute bronchitis, unspecified organism Resolved. Completed nebs. D/C flutter valve. Decrease Zantac to QD  2. Weakness Improved. Awaiting clearance from therapy to return home.   3. Chronic atrial fibrillation Rate controlled with BB  4. Essential hypertension, benign Controlled with Lasix and metoprolol  5. Slow transit constipation Controlled with Miralax  6. Chronic kidney disease, stage III (moderate) Recheck BMP after diuresis  7. Current use of long  term anticoagulation Using xarelto for CVA prevention. Tolerating well with no signs of bleeding  He will be ready for discharge later this week most likely. I will await recommendations from therapy.   Cindi Carbon, ANP [facility] [phone]

## 2015-01-14 DIAGNOSIS — N183 Chronic kidney disease, stage 3 (moderate): Secondary | ICD-10-CM | POA: Diagnosis not present

## 2015-01-14 DIAGNOSIS — R488 Other symbolic dysfunctions: Secondary | ICD-10-CM | POA: Diagnosis not present

## 2015-01-14 DIAGNOSIS — R2681 Unsteadiness on feet: Secondary | ICD-10-CM | POA: Diagnosis not present

## 2015-01-14 DIAGNOSIS — M6281 Muscle weakness (generalized): Secondary | ICD-10-CM | POA: Diagnosis not present

## 2015-01-14 DIAGNOSIS — G3184 Mild cognitive impairment, so stated: Secondary | ICD-10-CM | POA: Diagnosis not present

## 2015-01-14 DIAGNOSIS — R1313 Dysphagia, pharyngeal phase: Secondary | ICD-10-CM | POA: Diagnosis not present

## 2015-01-15 DIAGNOSIS — I5032 Chronic diastolic (congestive) heart failure: Secondary | ICD-10-CM | POA: Diagnosis not present

## 2015-01-15 DIAGNOSIS — N183 Chronic kidney disease, stage 3 (moderate): Secondary | ICD-10-CM | POA: Diagnosis not present

## 2015-01-15 DIAGNOSIS — G3184 Mild cognitive impairment, so stated: Secondary | ICD-10-CM | POA: Diagnosis not present

## 2015-01-15 DIAGNOSIS — R1313 Dysphagia, pharyngeal phase: Secondary | ICD-10-CM | POA: Diagnosis not present

## 2015-01-15 DIAGNOSIS — R2681 Unsteadiness on feet: Secondary | ICD-10-CM | POA: Diagnosis not present

## 2015-01-15 DIAGNOSIS — R609 Edema, unspecified: Secondary | ICD-10-CM | POA: Diagnosis not present

## 2015-01-15 DIAGNOSIS — M6281 Muscle weakness (generalized): Secondary | ICD-10-CM | POA: Diagnosis not present

## 2015-01-15 DIAGNOSIS — R488 Other symbolic dysfunctions: Secondary | ICD-10-CM | POA: Diagnosis not present

## 2015-01-16 DIAGNOSIS — G3184 Mild cognitive impairment, so stated: Secondary | ICD-10-CM | POA: Diagnosis not present

## 2015-01-16 DIAGNOSIS — R1313 Dysphagia, pharyngeal phase: Secondary | ICD-10-CM | POA: Diagnosis not present

## 2015-01-16 DIAGNOSIS — M6281 Muscle weakness (generalized): Secondary | ICD-10-CM | POA: Diagnosis not present

## 2015-01-16 DIAGNOSIS — R488 Other symbolic dysfunctions: Secondary | ICD-10-CM | POA: Diagnosis not present

## 2015-01-16 DIAGNOSIS — R2681 Unsteadiness on feet: Secondary | ICD-10-CM | POA: Diagnosis not present

## 2015-01-16 DIAGNOSIS — N183 Chronic kidney disease, stage 3 (moderate): Secondary | ICD-10-CM | POA: Diagnosis not present

## 2015-01-19 DIAGNOSIS — M6281 Muscle weakness (generalized): Secondary | ICD-10-CM | POA: Diagnosis not present

## 2015-01-19 DIAGNOSIS — R488 Other symbolic dysfunctions: Secondary | ICD-10-CM | POA: Diagnosis not present

## 2015-01-19 DIAGNOSIS — N183 Chronic kidney disease, stage 3 (moderate): Secondary | ICD-10-CM | POA: Diagnosis not present

## 2015-01-19 DIAGNOSIS — R2681 Unsteadiness on feet: Secondary | ICD-10-CM | POA: Diagnosis not present

## 2015-01-19 DIAGNOSIS — R1313 Dysphagia, pharyngeal phase: Secondary | ICD-10-CM | POA: Diagnosis not present

## 2015-01-19 DIAGNOSIS — G3184 Mild cognitive impairment, so stated: Secondary | ICD-10-CM | POA: Diagnosis not present

## 2015-01-20 DIAGNOSIS — R2681 Unsteadiness on feet: Secondary | ICD-10-CM | POA: Diagnosis not present

## 2015-01-20 DIAGNOSIS — N183 Chronic kidney disease, stage 3 (moderate): Secondary | ICD-10-CM | POA: Diagnosis not present

## 2015-01-20 DIAGNOSIS — R488 Other symbolic dysfunctions: Secondary | ICD-10-CM | POA: Diagnosis not present

## 2015-01-20 DIAGNOSIS — G3184 Mild cognitive impairment, so stated: Secondary | ICD-10-CM | POA: Diagnosis not present

## 2015-01-20 DIAGNOSIS — M6281 Muscle weakness (generalized): Secondary | ICD-10-CM | POA: Diagnosis not present

## 2015-01-20 DIAGNOSIS — R1313 Dysphagia, pharyngeal phase: Secondary | ICD-10-CM | POA: Diagnosis not present

## 2015-01-21 DIAGNOSIS — R488 Other symbolic dysfunctions: Secondary | ICD-10-CM | POA: Diagnosis not present

## 2015-01-21 DIAGNOSIS — R1313 Dysphagia, pharyngeal phase: Secondary | ICD-10-CM | POA: Diagnosis not present

## 2015-01-21 DIAGNOSIS — M6281 Muscle weakness (generalized): Secondary | ICD-10-CM | POA: Diagnosis not present

## 2015-01-21 DIAGNOSIS — N183 Chronic kidney disease, stage 3 (moderate): Secondary | ICD-10-CM | POA: Diagnosis not present

## 2015-01-21 DIAGNOSIS — G3184 Mild cognitive impairment, so stated: Secondary | ICD-10-CM | POA: Diagnosis not present

## 2015-01-21 DIAGNOSIS — R2681 Unsteadiness on feet: Secondary | ICD-10-CM | POA: Diagnosis not present

## 2015-01-22 DIAGNOSIS — G3184 Mild cognitive impairment, so stated: Secondary | ICD-10-CM | POA: Diagnosis not present

## 2015-01-22 DIAGNOSIS — R1313 Dysphagia, pharyngeal phase: Secondary | ICD-10-CM | POA: Diagnosis not present

## 2015-01-22 DIAGNOSIS — M6281 Muscle weakness (generalized): Secondary | ICD-10-CM | POA: Diagnosis not present

## 2015-01-22 DIAGNOSIS — R488 Other symbolic dysfunctions: Secondary | ICD-10-CM | POA: Diagnosis not present

## 2015-01-22 DIAGNOSIS — N183 Chronic kidney disease, stage 3 (moderate): Secondary | ICD-10-CM | POA: Diagnosis not present

## 2015-01-22 DIAGNOSIS — R2681 Unsteadiness on feet: Secondary | ICD-10-CM | POA: Diagnosis not present

## 2015-01-23 DIAGNOSIS — R1313 Dysphagia, pharyngeal phase: Secondary | ICD-10-CM | POA: Diagnosis not present

## 2015-01-23 DIAGNOSIS — G3184 Mild cognitive impairment, so stated: Secondary | ICD-10-CM | POA: Diagnosis not present

## 2015-01-23 DIAGNOSIS — M6281 Muscle weakness (generalized): Secondary | ICD-10-CM | POA: Diagnosis not present

## 2015-01-23 DIAGNOSIS — R2681 Unsteadiness on feet: Secondary | ICD-10-CM | POA: Diagnosis not present

## 2015-01-23 DIAGNOSIS — R488 Other symbolic dysfunctions: Secondary | ICD-10-CM | POA: Diagnosis not present

## 2015-01-23 DIAGNOSIS — N183 Chronic kidney disease, stage 3 (moderate): Secondary | ICD-10-CM | POA: Diagnosis not present

## 2015-01-26 DIAGNOSIS — G3184 Mild cognitive impairment, so stated: Secondary | ICD-10-CM | POA: Diagnosis not present

## 2015-01-26 DIAGNOSIS — N183 Chronic kidney disease, stage 3 (moderate): Secondary | ICD-10-CM | POA: Diagnosis not present

## 2015-01-26 DIAGNOSIS — R1313 Dysphagia, pharyngeal phase: Secondary | ICD-10-CM | POA: Diagnosis not present

## 2015-01-26 DIAGNOSIS — R2681 Unsteadiness on feet: Secondary | ICD-10-CM | POA: Diagnosis not present

## 2015-01-26 DIAGNOSIS — M6281 Muscle weakness (generalized): Secondary | ICD-10-CM | POA: Diagnosis not present

## 2015-01-26 DIAGNOSIS — R488 Other symbolic dysfunctions: Secondary | ICD-10-CM | POA: Diagnosis not present

## 2015-01-28 ENCOUNTER — Non-Acute Institutional Stay: Payer: Medicare Other | Admitting: Nurse Practitioner

## 2015-01-28 ENCOUNTER — Encounter: Payer: Self-pay | Admitting: Nurse Practitioner

## 2015-01-28 VITALS — BP 122/64 | HR 60 | Temp 97.6°F | Wt 211.0 lb

## 2015-01-28 DIAGNOSIS — R531 Weakness: Secondary | ICD-10-CM | POA: Diagnosis not present

## 2015-01-28 DIAGNOSIS — J209 Acute bronchitis, unspecified: Secondary | ICD-10-CM

## 2015-01-28 DIAGNOSIS — I482 Chronic atrial fibrillation, unspecified: Secondary | ICD-10-CM

## 2015-01-28 DIAGNOSIS — R609 Edema, unspecified: Secondary | ICD-10-CM | POA: Diagnosis not present

## 2015-01-28 NOTE — Progress Notes (Signed)
Patient ID: Gary [name], male   DOB: 08/03/1916, 81 y.o.   MRN: [id number]   Code Status: DNR Goals of Care:  Advanced Directive information   [facility]  Allergies  Allergen Reactions  . Plavix [Clopidogrel Bisulfate]     indigestion    Chief Complaint  Patient presents with  . Medical Management of Chronic Issues    2 week follow-up from Rehab - bronchitis    HPI: 81 y.o. male with h/o paroxysmal afib on xarelto, CKDII, htn, hyperglycemia,BPH, and tremor.  Who is being seen in clinic to follow up on rehab stay.  He was admitted to rehab after an episode of bronchitis treated with Levaquin for 7 days. Pt has been doing well at home. No shortness of breath, congestion, chest pains. Back to baseline activity.  Has gain weight since he has been home, good appetite Therapy going well since he has been home, feels like he has reached his max potential. They want him to be able to stop using his assistive device, he feels like he will always need it.  Compression socks are very hard for him to get on- has to have someone come over to actually put them on and he has a difficult time getting them off.    ROS: Review of Systems  Constitutional: Negative for fever, chills and weight loss.  HENT: Negative for tinnitus.   Respiratory: Negative for cough, sputum production and shortness of breath.   Cardiovascular: Negative for chest pain, palpitations and leg swelling.  Gastrointestinal: Negative for heartburn, abdominal pain, diarrhea and constipation.  Genitourinary: Negative for dysuria and frequency.  Musculoskeletal: Negative for myalgias, back pain and joint pain.  Skin: Negative.   Neurological: Negative for dizziness and headaches.  Psychiatric/Behavioral: Negative for depression and memory loss.     Past Medical History  Diagnosis Date  . Urinary frequency 10/01/2012  . Unspecified constipation 07/04/2012  . Irritable bowel syndrome 11/14/2011  .  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 Unspecified hereditary and idiopathic peripheral neuropathy 02/2010  . Pain in joint, lower leg 02/2010  . Insomnia with sleep apnea, unspecified 02/2010  . Abnormality of gait 02/2010  . Diarrhea 02/2010  . Long term (current) use of anticoagulants 11/2009  . Atrial fibrillation 11/2009  . Deviated nasal septum 11/2009  . Other dyspnea and respiratory abnormality 11/2009  . Other abnormal blood chemistry 02/09/2009  . Edema 2010  . Dizziness and giddiness 2009  . Tension headache 2009  . Sebaceous cyst 2008  . Other B-complex deficiencies 2008  . Other malaise and fatigue 2007  . Essential and other specified forms of tremor 2007  . Unspecified essential hypertension 2007  . Reflux esophagitis 2007  . Diaphragmatic hernia without mention of obstruction or gangrene 2007  . Palpitations 2007  . Unspecified transient cerebral ischemia 2002  . Hypertrophy of prostate without urinary obstruction and other lower urinary tract symptoms (LUTS) 2002  . Undiagnosed cardiac murmurs 2002  . Anal and rectal polyp 1980  . Hyperglycemia 01/02/2013  . Chronic kidney disease, stage III (moderate) 08/07/2013   History reviewed. No pertinent past surgical history. Social History:   reports that he has never smoked. He has never used smokeless tobacco. He reports that he drinks alcohol. He reports that he does not use illicit drugs.  Family History  Problem Relation Age of Onset  . Congestive Heart Failure Mother   . Diabetes Father   . Heart disease Father   .  Cancer Sister     lung   . Kidney disease Sister     Medications: Patient's Medications  New Prescriptions   No medications on file  Previous Medications   ACETAMINOPHEN 500 MG COAPSULE    Take by mouth. Take 2 tablets once daily as needed   B COMPLEX VITAMINS (B COMPLEX-B12 PO)    Take by mouth daily.   DOXAZOSIN (CARDURA) 4 MG TABLET    Take 4 mg by mouth. Take 1/2 tablets daily for  prostate   FUROSEMIDE (LASIX) 20 MG TABLET    TAKE 1 TABLET DAILY FOR EDEMA   LOPERAMIDE (IMODIUM A-D) 2 MG TABLET    Take 2 mg by mouth. Take 1/2 one (1mg ) as needed to treat loose stools   METOPROLOL SUCCINATE (TOPROL-XL) 50 MG 24 HR TABLET    Take one tablet  with or immediately following a meal.   MULTIPLE VITAMINS-MINERALS (CENTRUM SILVER PO)    Take by mouth. Take one tablet once daily   POLYETHYLENE GLYCOL (MIRALAX / GLYCOLAX) PACKET    Take 17 g by mouth daily.   RANITIDINE (ZANTAC) 75 MG TABLET    Take 75 mg by mouth. Take one daily to treat reflux   XARELTO 20 MG TABS TABLET    TAKE 1 TABLET ONCE DAILY FOR ANTICOAGULATION TO PREVENT STROKE  Modified Medications   No medications on file  Discontinued Medications   No medications on file     Physical Exam: Filed Vitals:   01/28/15 1127  BP: 122/64  Pulse: 60  Temp: 97.6 F (36.4 C)  TempSrc: Oral  Weight: 211 lb (95.709 kg)  SpO2: 97%  Physical Exam  Constitutional: He is oriented to person, place, and time. He appears well-developed and well-nourished. No distress.  HENT:  Head: Normocephalic and atraumatic.  Eyes: EOM are normal. Pupils are equal, round, and reactive to light.  glasses  Neck: Neck supple. No JVD present.  Cardiovascular: Normal rate.   irreg irreg at present  Pulmonary/Chest: Effort normal and breath sounds normal. No respiratory distress.  Abdominal: Soft. Bowel sounds are normal. He exhibits no distension.  Musculoskeletal: Normal range of motion. He exhibits no edema or tenderness.  Neurological: He is alert and oriented to person, place, and time.  Skin: Skin is warm and dry.  Psychiatric: He has a normal mood and affect. His behavior is normal.     Labs reviewed: Basic Metabolic Panel:  Recent Labs  12/23/14 12/25/14 01/02/15  NA 140 140 141  K 4.3 4.0 4.1  BUN 25* 22* 30*  CREATININE 1.5* 1.3 1.5*   Liver Function Tests:  Recent Labs  05/27/14  AST 13*  ALT 12  ALKPHOS 59    No results for input(s): LIPASE, AMYLASE in the last 8760 hours. No results for input(s): AMMONIA in the last 8760 hours. CBC:  Recent Labs  05/27/14 12/23/14 12/25/14  WBC 5.0 3.2 5.4  HGB 13.8 13.5 13.4*  HCT 40* 40* 39*  PLT 146* 111* 97*   Cardiac Enzymes: No results for input(s): CKTOTAL, CKMB, CKMBINDEX, TROPONINI in the last 8760 hours. BNP: Invalid input(s): POCBNP CBG: No results for input(s): GLUCAP in the last 8760 hours.  Imaging and Procedures: pcxr 4/21/6 cardiomegaly, no acute pulmonary pathology abd xray 12/25/14:  No acute abdominal pathology  Assessment/Plan  1. Acute bronchitis, unspecified organism Resolved   2. Weakness -has greatly improved, still getting rehab but pt reports he feels like he has maxed out benefit with therapy. Encouraged him  to continue as long as he was able to for strength and gait training    3. Chronic atrial fibrillation Rate controlled on current regimen, conts xarelto for anticoagulation   4. Edema -pt does not wish to wear compression hose and would like to stop wearing them. Discussed that they are recommended to keep swelling down but he can try to do without them and if swelling increases to start wearing them again.

## 2015-01-30 DIAGNOSIS — M6281 Muscle weakness (generalized): Secondary | ICD-10-CM | POA: Diagnosis not present

## 2015-01-30 DIAGNOSIS — N183 Chronic kidney disease, stage 3 (moderate): Secondary | ICD-10-CM | POA: Diagnosis not present

## 2015-01-30 DIAGNOSIS — G3184 Mild cognitive impairment, so stated: Secondary | ICD-10-CM | POA: Diagnosis not present

## 2015-01-30 DIAGNOSIS — R2681 Unsteadiness on feet: Secondary | ICD-10-CM | POA: Diagnosis not present

## 2015-01-30 DIAGNOSIS — R488 Other symbolic dysfunctions: Secondary | ICD-10-CM | POA: Diagnosis not present

## 2015-01-30 DIAGNOSIS — R1313 Dysphagia, pharyngeal phase: Secondary | ICD-10-CM | POA: Diagnosis not present

## 2015-02-06 DIAGNOSIS — M6281 Muscle weakness (generalized): Secondary | ICD-10-CM | POA: Diagnosis not present

## 2015-02-06 DIAGNOSIS — N183 Chronic kidney disease, stage 3 (moderate): Secondary | ICD-10-CM | POA: Diagnosis not present

## 2015-02-06 DIAGNOSIS — R2681 Unsteadiness on feet: Secondary | ICD-10-CM | POA: Diagnosis not present

## 2015-02-06 DIAGNOSIS — R278 Other lack of coordination: Secondary | ICD-10-CM | POA: Diagnosis not present

## 2015-02-23 DIAGNOSIS — H33303 Unspecified retinal break, bilateral: Secondary | ICD-10-CM | POA: Diagnosis not present

## 2015-02-23 DIAGNOSIS — Z961 Presence of intraocular lens: Secondary | ICD-10-CM | POA: Diagnosis not present

## 2015-02-23 DIAGNOSIS — H04123 Dry eye syndrome of bilateral lacrimal glands: Secondary | ICD-10-CM | POA: Diagnosis not present

## 2015-03-24 ENCOUNTER — Telehealth: Payer: Self-pay

## 2015-03-24 NOTE — Telephone Encounter (Signed)
Dr. Mariea Clonts received note from [facility] at [place], on 03/19/15 care giver found him lying on the sofa, breathing, but did not respond when she called his name. The nurse talked with him patient stated he had been sleeping during the day lately, since he was not resting well at night. He wakes up to urinate several times per night, and has some difficulty going back to sleep. He wakes several more times prior to 5:00am with shortness of breath and sometimes, three sharp chest pains. He states he also sometimes is aware of his heart "fibrillating", and that if he consciously takes deep, rhythmic breaths, he can feel his heart slow back into a more normal comfortable rhythm. Vs: BP 119/64, AP 60 irregular, 02 sat 97%. He admits he has not kept up his walking and exercise, since his therapy had stopped. Called patient and schedule appt with Dr. Mariea Clonts 04-01-15 at 9:00, told him we received the note from the nurse, he said he is feeling much better now. Told him we still needed to see him, his next appt isn't until 06/03/15 at 11:00. He agree to come in.

## 2015-04-01 ENCOUNTER — Non-Acute Institutional Stay: Payer: Medicare Other | Admitting: Internal Medicine

## 2015-04-01 ENCOUNTER — Encounter: Payer: Self-pay | Admitting: Internal Medicine

## 2015-04-01 VITALS — BP 104/60 | HR 68 | Temp 97.8°F | Resp 16 | Wt 215.0 lb

## 2015-04-01 DIAGNOSIS — I509 Heart failure, unspecified: Secondary | ICD-10-CM | POA: Diagnosis not present

## 2015-04-01 DIAGNOSIS — R609 Edema, unspecified: Secondary | ICD-10-CM

## 2015-04-01 DIAGNOSIS — I4891 Unspecified atrial fibrillation: Secondary | ICD-10-CM

## 2015-04-01 DIAGNOSIS — I482 Chronic atrial fibrillation, unspecified: Secondary | ICD-10-CM

## 2015-04-01 MED ORDER — METOPROLOL TARTRATE 25 MG PO TABS: 12.5000 mg | ORAL_TABLET | Freq: Every day | ORAL | Status: DC | PRN

## 2015-04-01 NOTE — Progress Notes (Signed)
Patient ID: Gary [name], male   DOB: 1916/09/02, 81 y.o.   MRN: [id number]   Location:  [facility]  Code Status: DNR  Goals of Care:Advanced Directive information Does patient have an advance directive?: Yes, Type of Advance Directive: [place];Living will;Out of facility DNR (pink MOST or yellow form), Pre-existing out of facility DNR order (yellow form or pink MOST form): Yellow form placed in chart (order not valid for inpatient use), Does patient want to make changes to advanced directive?: No - Patient declined  Chief Complaint  Patient presents with  . Acute Visit    heart fibrillating, trouble sleeping, SOB. (see nurse note from 03/19/15) Has had few episodes since 03/19/15    HPI: Patient is a 81 y.o. white male seen in the [facility] today for an acute visit due to several concerns as above.  The clinic nurse checked on him when he did not answer the door due to sleeping w/o hearing aides. When in bed, he'd be half awake and have to take big breaths to feel he was adequately getting his breath.  Has to pant when doing simple adls.  Usually it is when he is having periods of afib.  No chest pain.  His deep breaths seem to allow him to control his heart rate.  Knows he's deconditioned. He says he knows exercise would benefit him, but he really does not want to do the exercise.   Says he would like to just slide into a decline and pass away.    Has always had a low baseline HR at rest.   He is on troprol 50mg  and xarelto for blood thinning.    Left hand has wound this is still bleeding a little bit.  He scraped it on a sharp piece of furniture this am (dorsum of hand).  Also scraped on just the side of his toothpaste.    Review of Systems:  Review of Systems  Constitutional: Negative for fever, chills and weight loss.       Weight gain especially in abdomen  HENT: Positive for hearing loss.   Eyes: Negative for blurred vision.       Glasses    Respiratory: Positive for shortness of breath. Negative for cough and wheezing.   Cardiovascular: Positive for palpitations. Negative for chest pain and leg swelling.  Gastrointestinal: Negative for abdominal pain, constipation, blood in stool and melena.  Genitourinary: Negative for dysuria.  Musculoskeletal: Negative for falls.  Skin: Negative for rash.       Left hand excoriation bleeding, has bandaid on it  Neurological: Positive for weakness. Negative for dizziness and loss of consciousness.  Endo/Heme/Allergies: Bruises/bleeds easily.  Psychiatric/Behavioral: Positive for memory loss. Negative for depression.       Mild memory loss    Past Medical History  Diagnosis Date  . Urinary frequency 10/01/2012  . Unspecified constipation 07/04/2012  . Irritable bowel syndrome 11/14/2011  . 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 Unspecified hereditary and idiopathic peripheral neuropathy 02/2010  . Pain in joint, lower leg 02/2010  . Insomnia with sleep apnea, unspecified 02/2010  . Abnormality of gait 02/2010  . Diarrhea 02/2010  . Long term (current) use of anticoagulants 11/2009  . Atrial fibrillation 11/2009  . Deviated nasal septum 11/2009  . Other dyspnea and respiratory abnormality 11/2009  . Other abnormal blood chemistry 02/09/2009  . Edema 2010  . Dizziness and giddiness 2009  . Tension headache 2009  .  Sebaceous cyst 2008  . Other B-complex deficiencies 2008  . Other malaise and fatigue 2007  . Essential and other specified forms of tremor 2007  . Unspecified essential hypertension 2007  . Reflux esophagitis 2007  . Diaphragmatic hernia without mention of obstruction or gangrene 2007  . Palpitations 2007  . Unspecified transient cerebral ischemia 2002  . Hypertrophy of prostate without urinary obstruction and other lower urinary tract symptoms (LUTS) 2002  . Undiagnosed cardiac murmurs 2002  . Anal and rectal polyp 1980  . Hyperglycemia 01/02/2013  . Chronic  kidney disease, stage III (moderate) 08/07/2013    History reviewed. No pertinent past surgical history.  Social History:   reports that he has never smoked. He has never used smokeless tobacco. He reports that he drinks alcohol. He reports that he does not use illicit drugs.  Allergies  Allergen Reactions  . Plavix [Clopidogrel Bisulfate]     indigestion    Medications: Patient's Medications  New Prescriptions   METOPROLOL TARTRATE (LOPRESSOR) 25 MG TABLET    Take 0.5 tablets (12.5 mg total) by mouth daily as needed.  Previous Medications   ACETAMINOPHEN 500 MG COAPSULE    Take by mouth. Take 2 tablets once daily as needed   B COMPLEX VITAMINS (B COMPLEX-B12 PO)    Take by mouth daily.   DOXAZOSIN (CARDURA) 4 MG TABLET    Take 4 mg by mouth. Take 1/2 tablets daily for prostate   FUROSEMIDE (LASIX) 20 MG TABLET    TAKE 1 TABLET DAILY FOR EDEMA   LOPERAMIDE (IMODIUM A-D) 2 MG TABLET    Take 2 mg by mouth. Take 1/2 one (1mg ) as needed to treat loose stools   METOPROLOL SUCCINATE (TOPROL-XL) 50 MG 24 HR TABLET    Take one tablet  with or immediately following a meal.   MULTIPLE VITAMINS-MINERALS (CENTRUM SILVER PO)    Take by mouth. Take one tablet once daily   POLYETHYLENE GLYCOL (MIRALAX / GLYCOLAX) PACKET    Take 17 g by mouth daily. As needed for constipation   RANITIDINE (ZANTAC) 75 MG TABLET    Take 75 mg by mouth. Take one daily to treat reflux   XARELTO 20 MG TABS TABLET    TAKE 1 TABLET ONCE DAILY FOR ANTICOAGULATION TO PREVENT STROKE  Modified Medications   No medications on file  Discontinued Medications   No medications on file     Physical Exam: Filed Vitals:   04/01/15 0857  BP: 104/60  Pulse: 68  Temp: 97.8 F (36.6 C)  TempSrc: Oral  Weight: 215 lb (97.523 kg)   Body mass index is 28.37 kg/(m^2). Physical Exam  Constitutional: He is oriented to person, place, and time. He appears well-developed and well-nourished. No distress.  White male seated on exam  table  Cardiovascular: Intact distal pulses.   irreg irreg  Pulmonary/Chest: Effort normal and breath sounds normal.  Abdominal: Soft. Bowel sounds are normal. He exhibits no distension. There is no tenderness.  Musculoskeletal: Normal range of motion.  Uses scooter for long distances  Neurological: He is alert and oriented to person, place, and time.  Skin:  Left hand excoriation with small amt of bleeding, has bandaid applied  Psychiatric: He has a normal mood and affect.     Labs reviewed: Basic Metabolic Panel:  Recent Labs  05/27/14 12/23/14 12/25/14 01/02/15  NA 142 140 140 141  K 4.0 4.3 4.0 4.1  BUN 28* 25* 22* 30*  CREATININE 1.5* 1.5* 1.3 1.5*  TSH 5.38 5.03  --   --    Liver Function Tests:  Recent Labs  05/27/14  AST 13*  ALT 12  ALKPHOS 59   No results for input(s): LIPASE, AMYLASE in the last 8760 hours. No results for input(s): AMMONIA in the last 8760 hours. CBC:  Recent Labs  05/27/14 12/23/14 12/25/14  WBC 5.0 3.2 5.4  HGB 13.8 13.5 13.4*  HCT 40* 40* 39*  PLT 146* 111* 97*   Lipid Panel: No results for input(s): CHOL, HDL, LDLCALC, TRIG, CHOLHDL, LDLDIRECT in the last 8760 hours. No results found for: HGBA1C  Procedures since last appt:  Patient Care Team: Gayland Curry, DO as PCP - General (Geriatric Medicine) [facility]  Assessment/Plan 1. Chronic atrial fibrillation -having periods of RVR based on history -he is not interested in aggressive workup or visiting cardiology for this at this time -he will continue to use his deep breaths to lower his pulse "when fibrillating" and we will try short-acting lopressor at the time of "fibrillation"--12.5mg  once when he has symptoms--he is unable to check his own pulse (we tried this during the visit) -I don't want to use digoxin due to risks in his age group and drug interactions -I don't want to increase his toprol due to already low pulse -cont toprol and xarelto  anticoagulation  2. Chronic congestive heart failure, unspecified congestive heart failure type -cont lasix 20mg  daily -no echo in his chart, but he is not wanting me to work up his problems at this time--requests palliative treatment -if weight continues to trend up, will increase lasix  3. Edema -advised to try to cut back on his salt use which he admits is excessive -he also says he's been eating too much--weight gain noted in chart-says he usually weighs around 200 but now 215 lbs -elevate feet at rest  Labs/tests ordered:  Has labs before sept appt Next appt:  Keep sept appt  Keisuke Hollabaugh L. Vergene Marland, D.O. [facility] [address], [place] [address] Cell Phone (Mon-Fri 8am-5pm):  [phone] On Call:  [phone] & follow prompts after 5pm & weekends Office Phone:  [phone] Office Fax:  [phone]

## 2015-04-05 ENCOUNTER — Encounter: Payer: Self-pay | Admitting: Internal Medicine

## 2015-04-08 ENCOUNTER — Non-Acute Institutional Stay: Payer: Medicare Other | Admitting: Internal Medicine

## 2015-04-08 ENCOUNTER — Encounter: Payer: Self-pay | Admitting: Internal Medicine

## 2015-04-08 VITALS — BP 122/74 | HR 60 | Temp 97.6°F | Ht 73.0 in | Wt 216.0 lb

## 2015-04-08 DIAGNOSIS — Z7901 Long term (current) use of anticoagulants: Secondary | ICD-10-CM | POA: Diagnosis not present

## 2015-04-08 DIAGNOSIS — R002 Palpitations: Secondary | ICD-10-CM

## 2015-04-08 DIAGNOSIS — I482 Chronic atrial fibrillation, unspecified: Secondary | ICD-10-CM

## 2015-04-08 DIAGNOSIS — R042 Hemoptysis: Secondary | ICD-10-CM | POA: Diagnosis not present

## 2015-04-08 NOTE — Progress Notes (Signed)
Patient ID: Gary [name], male   DOB: 03/26/1916, 81 y.o.   MRN: [id number]   Location:  [facility]  Code Status: DNR  Goals of Care:Advanced Directive information Does patient have an advance directive?: Yes, Type of Advance Directive: Living will;Healthcare Power of Merlin;Out of facility DNR (pink MOST or yellow form), Pre-existing out of facility DNR order (yellow form or pink MOST form): Yellow form placed in chart (order not valid for inpatient use), Does patient want to make changes to advanced directive?: No - Patient declined  Chief Complaint  Patient presents with  . Acute Visit    Coughing up blood x 1 episode last night.   . Medication Management    Discuss Metoprolol, patient is confused between metoprolol tar and metoprolol succ    HPI: Patient is a 81 y.o. white male seen in the [facility] today for an acute visit due to an episode of hemoptysis and for medication mgt re: his metoprolol.  Last night, he was having some irritation on the left side of his throat like he did before he had bronchitis the last time.  He coughed and a blood clot came up and some blood one time.  This did not recur.  He feels otherwise no different.  He is on xarelto.  He still has the injury on his left hand from last week with a bandaid on it.    He's had one episode of the palpitations that lasted just 5 mins and resolved.  He did not take his metoprolol prn b/c he forgot what I told him to do with it.  I wrote him out instructions this time so he does not forget.  We reviewed the instructions again before he left today and he remembered what to do--take metoprolol succinate daily and just take tartrate if he has an episode of palpitations.  Review of Systems:  Review of Systems  Constitutional: Positive for malaise/fatigue. Negative for fever and chills.  HENT: Negative for congestion.        Soreness of left neck/lymph nodes  Respiratory: Positive for cough and hemoptysis.  Negative for sputum production and shortness of breath.   Cardiovascular: Positive for palpitations. Negative for chest pain and leg swelling.  Musculoskeletal: Negative for falls.  Neurological: Positive for weakness. Negative for dizziness and loss of consciousness.  Psychiatric/Behavioral: Positive for memory loss.    Past Medical History  Diagnosis Date  . Urinary frequency 10/01/2012  . Unspecified constipation 07/04/2012  . Irritable bowel syndrome 11/14/2011  . 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 Unspecified hereditary and idiopathic peripheral neuropathy 02/2010  . Pain in joint, lower leg 02/2010  . Insomnia with sleep apnea, unspecified 02/2010  . Abnormality of gait 02/2010  . Diarrhea 02/2010  . Long term (current) use of anticoagulants 11/2009  . Atrial fibrillation 11/2009  . Deviated nasal septum 11/2009  . Other dyspnea and respiratory abnormality 11/2009  . Other abnormal blood chemistry 02/09/2009  . Edema 2010  . Dizziness and giddiness 2009  . Tension headache 2009  . Sebaceous cyst 2008  . Other B-complex deficiencies 2008  . Other malaise and fatigue 2007  . Essential and other specified forms of tremor 2007  . Unspecified essential hypertension 2007  . Reflux esophagitis 2007  . Diaphragmatic hernia without mention of obstruction or gangrene 2007  . Palpitations 2007  . Unspecified transient cerebral ischemia 2002  . Hypertrophy of prostate without urinary obstruction and other lower urinary tract  symptoms (LUTS) 2002  . Undiagnosed cardiac murmurs 2002  . Anal and rectal polyp 1980  . Hyperglycemia 01/02/2013  . Chronic kidney disease, stage III (moderate) 08/07/2013    History reviewed. No pertinent past surgical history.  Social History:   reports that he has never smoked. He has never used smokeless tobacco. He reports that he drinks alcohol. He reports that he does not use illicit drugs.  Allergies  Allergen Reactions  . Plavix  [Clopidogrel Bisulfate]     indigestion    Medications: Patient's Medications  New Prescriptions   No medications on file  Previous Medications   ACETAMINOPHEN 500 MG COAPSULE    Take by mouth. Take 2 tablets once daily as needed   B COMPLEX VITAMINS (B COMPLEX-B12 PO)    Take by mouth daily.   DOXAZOSIN (CARDURA) 4 MG TABLET    Take 4 mg by mouth. Take 1/2 tablets daily for prostate   FUROSEMIDE (LASIX) 20 MG TABLET    TAKE 1 TABLET DAILY FOR EDEMA   LOPERAMIDE (IMODIUM A-D) 2 MG TABLET    Take 2 mg by mouth. Take 1/2 one (1mg ) as needed to treat loose stools   METOPROLOL SUCCINATE (TOPROL-XL) 50 MG 24 HR TABLET    Take one tablet  with or immediately following a meal.   METOPROLOL TARTRATE (LOPRESSOR) 25 MG TABLET    Take 0.5 tablets (12.5 mg total) by mouth daily as needed.   MULTIPLE VITAMINS-MINERALS (CENTRUM SILVER PO)    Take by mouth. Take one tablet once daily   POLYETHYLENE GLYCOL (MIRALAX / GLYCOLAX) PACKET    Take 17 g by mouth daily. As needed for constipation   RANITIDINE (ZANTAC) 75 MG TABLET    Take 75 mg by mouth. Take one daily to treat reflux   XARELTO 20 MG TABS TABLET    TAKE 1 TABLET ONCE DAILY FOR ANTICOAGULATION TO PREVENT STROKE  Modified Medications   No medications on file  Discontinued Medications   No medications on file     Physical Exam: Filed Vitals:   04/08/15 1611  BP: 122/74  Pulse: 60  Temp: 97.6 F (36.4 C)  TempSrc: Oral  Height: 6\' 1"  (1.854 m)  Weight: 216 lb (97.977 kg)  SpO2: 96%   Body mass index is 28.5 kg/(m^2). Physical Exam  Constitutional: He appears well-developed and well-nourished. No distress.  HENT:  Nose: Nose normal.  Mouth/Throat: Oropharynx is clear and moist. No oropharyngeal exudate.  Hearing aides  Eyes:  glasses  Neck: Normal range of motion. Neck supple. No JVD present. No tracheal deviation present. No thyromegaly present.  Cardiovascular: Intact distal pulses.   irreg irreg  Pulmonary/Chest: Effort  normal and breath sounds normal. No respiratory distress. He has no wheezes. He has no rales.  Lymphadenopathy:    He has no cervical adenopathy.  Skin: Skin is warm and dry.  Several small ecchymoses of arms, hands, left hand excoriation remains with bandaid in place     Labs reviewed: Basic Metabolic Panel:  Recent Labs  05/27/14 12/23/14 12/25/14 01/02/15  NA 142 140 140 141  K 4.0 4.3 4.0 4.1  BUN 28* 25* 22* 30*  CREATININE 1.5* 1.5* 1.3 1.5*  TSH 5.38 5.03  --   --    Liver Function Tests:  Recent Labs  05/27/14  AST 13*  ALT 12  ALKPHOS 59   No results for input(s): LIPASE, AMYLASE in the last 8760 hours. No results for input(s): AMMONIA in the last 8760  hours. CBC:  Recent Labs  05/27/14 12/23/14 12/25/14  WBC 5.0 3.2 5.4  HGB 13.8 13.5 13.4*  HCT 40* 40* 39*  PLT 146* 111* 97*   Lipid Panel: No results for input(s): CHOL, HDL, LDLCALC, TRIG, CHOLHDL, LDLDIRECT in the last 8760 hours. No results found for: HGBA1C  Procedures since last appt:  Patient Care Team: Gayland Curry, DO as PCP - General (Geriatric Medicine) [facility]  Assessment/Plan 1. Hemoptysis -a single episode -throat clear, lungs clear -advised to monitor and let me know if it recurs, but suspect due to coughing and throat clearing in context of xarelto vs. Epistaxis that dripped down the back of his throat  2. Chronic atrial fibrillation -with episodes of possible RVR vs. Other arrhythmia--he is not wanting this worked up at this point--says you have to die of something  3. Current use of long term anticoagulation -continues on xarelto for afib  4. Palpitations -had only one episode since last time and now knows to take the metroprolol tartrate if he has another episode.  Labs/tests ordered: no new Next appt:  Keep as scheduled  Juan Olthoff L. Kimberlea Schlag, D.O. [facility] [address], [place] [address] Cell  Phone (Mon-Fri 8am-5pm):  [phone] On Call:  [phone] & follow prompts after 5pm & weekends Office Phone:  [phone] Office Fax:  [phone]

## 2015-04-15 ENCOUNTER — Encounter: Payer: Self-pay | Admitting: Internal Medicine

## 2015-04-22 ENCOUNTER — Encounter: Payer: Self-pay | Admitting: Internal Medicine

## 2015-04-22 ENCOUNTER — Non-Acute Institutional Stay: Payer: Medicare Other | Admitting: Internal Medicine

## 2015-04-22 VITALS — BP 110/62 | HR 60 | Temp 97.9°F | Wt 216.0 lb

## 2015-04-22 DIAGNOSIS — K625 Hemorrhage of anus and rectum: Secondary | ICD-10-CM

## 2015-04-22 DIAGNOSIS — Z7901 Long term (current) use of anticoagulants: Secondary | ICD-10-CM | POA: Diagnosis not present

## 2015-04-22 DIAGNOSIS — I482 Chronic atrial fibrillation, unspecified: Secondary | ICD-10-CM

## 2015-04-22 DIAGNOSIS — R042 Hemoptysis: Secondary | ICD-10-CM

## 2015-04-22 LAB — BASIC METABOLIC PANEL
BUN: 30 mg/dL — AB (ref 4–21)
Creatinine: 1.4 mg/dL — AB (ref 0.6–1.3)
Glucose: 129 mg/dL
POTASSIUM: 3.7 mmol/L (ref 3.4–5.3)
SODIUM: 143 mmol/L (ref 137–147)

## 2015-04-22 LAB — HEPATIC FUNCTION PANEL
ALK PHOS: 57 U/L (ref 25–125)
ALT: 12 U/L (ref 10–40)
AST: 12 U/L — AB (ref 14–40)
BILIRUBIN, TOTAL: 1.3 mg/dL

## 2015-04-22 LAB — CBC AND DIFFERENTIAL
HEMATOCRIT: 36 % — AB (ref 41–53)
Hemoglobin: 12.4 g/dL — AB (ref 13.5–17.5)
Platelets: 141 10*3/uL — AB (ref 150–399)
WBC: 4.8 10^3/mL

## 2015-04-22 NOTE — Progress Notes (Signed)
Patient ID: Gary [name], male   DOB: May 26, 1916, 81 y.o.   MRN: [id number]   Location:  [facility]  Code Status: DNR  Goals of Care:Advanced Directive information Type of Advance Directive: [place];Living will;Out of facility DNR (pink MOST or yellow form), Pre-existing out of facility DNR order (yellow form or pink MOST form): Yellow form placed in chart (order not valid for inpatient use)  Chief Complaint  Patient presents with  . Melena    on Sat 8/13 and Mon 8/15 had black stools, on Tues & Wed stools were not black but very dark. 2 days after 04/08/15 visit, he coughed up some more blood.     HPI: Patient is a 81 y.o. white male seen in the [facility] today for an acute visit for melena and continued hemoptysis.  He had had a single episode of hemoptysis just before I saw him last time with a large clot that he saved for me to see.  He apparently did have one recurrence of that since 8/3.  Now on 8/13 and 8/15, he had dark black stools.  This am, stool was not black, but was loose and he had an accident trying to get to the restroom in time.  He's had a discomfort in the abdomen and it's been getting larger over the past several months, but he denies any true pain.  He has not taken any iron or pepto bismol.  He remains on his xarelto for his afib.    In reference to the palpitations, she has taken his lopressor on a couple of occasions.  He's had a recurrence later in the day both times, but did not repeat the medication later on due to the instructions.  He does not want to return to cardiology for further evaluation.  Review of Systems:  Review of Systems  Constitutional: Positive for malaise/fatigue. Negative for fever and chills.  HENT: Positive for hearing loss.        Hearing aides  Eyes:       Glasses  Respiratory: Positive for hemoptysis. Negative for shortness of breath.   Cardiovascular: Positive for palpitations. Negative for chest  pain and leg swelling.       Afib  Gastrointestinal: Positive for melena. Negative for heartburn, nausea and vomiting.       Discomfort in abdomen, bloating, no hematochezia or hematemesis  Genitourinary: Negative for dysuria and urgency.  Musculoskeletal: Negative for falls.  Neurological: Positive for weakness. Negative for dizziness and loss of consciousness.  Endo/Heme/Allergies: Bruises/bleeds easily.  Psychiatric/Behavioral: Positive for memory loss.    Past Medical History  Diagnosis Date  . Urinary frequency 10/01/2012  . Unspecified constipation 07/04/2012  . Irritable bowel syndrome 11/14/2011  . 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 Unspecified hereditary and idiopathic peripheral neuropathy 02/2010  . Pain in joint, lower leg 02/2010  . Insomnia with sleep apnea, unspecified 02/2010  . Abnormality of gait 02/2010  . Diarrhea 02/2010  . Long term (current) use of anticoagulants 11/2009  . Atrial fibrillation 11/2009  . Deviated nasal septum 11/2009  . Other dyspnea and respiratory abnormality 11/2009  . Other abnormal blood chemistry 02/09/2009  . Edema 2010  . Dizziness and giddiness 2009  . Tension headache 2009  . Sebaceous cyst 2008  . Other B-complex deficiencies 2008  . Other malaise and fatigue 2007  . Essential and other specified forms of tremor 2007  . Unspecified essential hypertension 2007  . Reflux  esophagitis 2007  . Diaphragmatic hernia without mention of obstruction or gangrene 2007  . Palpitations 2007  . Unspecified transient cerebral ischemia 2002  . Hypertrophy of prostate without urinary obstruction and other lower urinary tract symptoms (LUTS) 2002  . Undiagnosed cardiac murmurs 2002  . Anal and rectal polyp 1980  . Hyperglycemia 01/02/2013  . Chronic kidney disease, stage III (moderate) 08/07/2013    History reviewed. No pertinent past surgical history.  Social History:   reports that he has never smoked. He has never used  smokeless tobacco. He reports that he drinks alcohol. He reports that he does not use illicit drugs.  Allergies  Allergen Reactions  . Plavix [Clopidogrel Bisulfate]     indigestion    Medications: Patient's Medications  New Prescriptions   No medications on file  Previous Medications   ACETAMINOPHEN 500 MG COAPSULE    Take by mouth. Take 2 tablets once daily as needed   B COMPLEX VITAMINS (B COMPLEX-B12 PO)    Take by mouth daily.   DOXAZOSIN (CARDURA) 4 MG TABLET    Take 4 mg by mouth. Take 1/2 tablets daily for prostate   FUROSEMIDE (LASIX) 20 MG TABLET    TAKE 1 TABLET DAILY FOR EDEMA   LOPERAMIDE (IMODIUM A-D) 2 MG TABLET    Take 2 mg by mouth. Take 1/2 one (1mg ) as needed to treat loose stools   METOPROLOL SUCCINATE (TOPROL-XL) 50 MG 24 HR TABLET    Take one tablet  with or immediately following a meal.   METOPROLOL TARTRATE (LOPRESSOR) 25 MG TABLET    Take 0.5 tablets (12.5 mg total) by mouth daily as needed.   MULTIPLE VITAMINS-MINERALS (CENTRUM SILVER PO)    Take by mouth. Take one tablet once daily   POLYETHYLENE GLYCOL (MIRALAX / GLYCOLAX) PACKET    Take 17 g by mouth daily. As needed for constipation   RANITIDINE (ZANTAC) 75 MG TABLET    Take 75 mg by mouth. Take one daily to treat reflux   XARELTO 20 MG TABS TABLET    TAKE 1 TABLET ONCE DAILY FOR ANTICOAGULATION TO PREVENT STROKE  Modified Medications   No medications on file  Discontinued Medications   No medications on file     Physical Exam: Filed Vitals:   04/22/15 1329  BP: 110/62  Pulse: 60  Temp: 97.9 F (36.6 C)  TempSrc: Oral  Weight: 216 lb (97.977 kg)  SpO2: 97%   Body mass index is 28.5 kg/(m^2). Physical Exam  Constitutional: He is oriented to person, place, and time. He appears well-developed and well-nourished. No distress.  Cardiovascular:  irreg irreg  Pulmonary/Chest: Effort normal and breath sounds normal. No respiratory distress.  Abdominal: Soft. Bowel sounds are normal. He exhibits  distension. He exhibits no mass. There is no tenderness. There is no rebound and no guarding. No hernia.  Genitourinary: Guaiac positive stool.  Soft light brown stool, but overtly heme positive on hemoccult card; redness of buttocks, no external hemorrhoids seen  Musculoskeletal: Normal range of motion.  Walks short distances with rolling walker; uses scooter for long distances  Neurological: He is alert and oriented to person, place, and time.  Some short term memory loss  Skin: Skin is warm and dry.     Labs reviewed: Basic Metabolic Panel:  Recent Labs  05/27/14 12/23/14 12/25/14 01/02/15  NA 142 140 140 141  K 4.0 4.3 4.0 4.1  BUN 28* 25* 22* 30*  CREATININE 1.5* 1.5* 1.3 1.5*  TSH 5.38  5.03  --   --    Liver Function Tests:  Recent Labs  05/27/14  AST 13*  ALT 12  ALKPHOS 59   No results for input(s): LIPASE, AMYLASE in the last 8760 hours. No results for input(s): AMMONIA in the last 8760 hours. CBC:  Recent Labs  05/27/14 12/23/14 12/25/14  WBC 5.0 3.2 5.4  HGB 13.8 13.5 13.4*  HCT 40* 40* 39*  PLT 146* 111* 97*   Lipid Panel: No results for input(s): CHOL, HDL, LDLCALC, TRIG, CHOLHDL, LDLDIRECT in the last 8760 hours. No results found for: HGBA1C  Patient Care Team: Gayland Curry, DO as PCP - General (Geriatric Medicine) [facility]  Assessment/Plan 1. Rectal bleeding - heme positive stool today while on xarelto and had episode 8/3 and once since then as well plus two known episodes of melena -stop xarelto now and monitor -check cbc, cmp today  2. Hemoptysis -two episodes -stop xarelto and check cbc, cmp  3. Chronic atrial fibrillation -stop xarelto -cont toprol with prn lopressor for breakthrough RVR -pt does not want further workup/eval by cardiology at this time--requests palliative treatment  4. Current use of long term anticoagulation -discontinue xarelto due to rectal bleeding and hemoptysis -will monitor  him  Labs/tests ordered: cbc, cmp Next appt:  Keep regular visit as scheduled  Daouda Lonzo L. Shlomo Seres, D.O. [facility] [address], [place] [address] Cell Phone (Mon-Fri 8am-5pm):  [phone] On Call:  [phone] & follow prompts after 5pm & weekends Office Phone:  [phone] Office Fax:  [phone]

## 2015-04-23 ENCOUNTER — Encounter: Payer: Self-pay | Admitting: *Deleted

## 2015-04-24 ENCOUNTER — Telehealth: Payer: Self-pay

## 2015-04-24 NOTE — Telephone Encounter (Signed)
Called spoke with patient about his lab results per Dr. Magdalene Molly instructions. Made an appointment for lab work for him on 04/30/15 per Dr. Magdalene Molly instructions.

## 2015-04-26 ENCOUNTER — Encounter: Payer: Self-pay | Admitting: Internal Medicine

## 2015-04-30 ENCOUNTER — Other Ambulatory Visit: Payer: Medicare Other

## 2015-05-06 ENCOUNTER — Encounter: Payer: Self-pay | Admitting: Gastroenterology

## 2015-05-13 ENCOUNTER — Other Ambulatory Visit: Payer: Self-pay | Admitting: Internal Medicine

## 2015-05-16 ENCOUNTER — Other Ambulatory Visit: Payer: Self-pay | Admitting: Internal Medicine

## 2015-06-01 ENCOUNTER — Other Ambulatory Visit: Payer: Self-pay | Admitting: Internal Medicine

## 2015-06-03 ENCOUNTER — Encounter: Payer: Self-pay | Admitting: Internal Medicine

## 2015-06-03 ENCOUNTER — Non-Acute Institutional Stay: Payer: Medicare Other | Admitting: Internal Medicine

## 2015-06-03 VITALS — BP 120/64 | HR 52 | Temp 98.8°F | Wt 214.0 lb

## 2015-06-03 DIAGNOSIS — N400 Enlarged prostate without lower urinary tract symptoms: Secondary | ICD-10-CM

## 2015-06-03 DIAGNOSIS — Z79899 Other long term (current) drug therapy: Secondary | ICD-10-CM | POA: Diagnosis not present

## 2015-06-03 DIAGNOSIS — I509 Heart failure, unspecified: Secondary | ICD-10-CM | POA: Diagnosis not present

## 2015-06-03 DIAGNOSIS — R195 Other fecal abnormalities: Secondary | ICD-10-CM | POA: Diagnosis not present

## 2015-06-03 DIAGNOSIS — K625 Hemorrhage of anus and rectum: Secondary | ICD-10-CM | POA: Diagnosis not present

## 2015-06-03 DIAGNOSIS — I482 Chronic atrial fibrillation, unspecified: Secondary | ICD-10-CM

## 2015-06-03 DIAGNOSIS — R042 Hemoptysis: Secondary | ICD-10-CM | POA: Insufficient documentation

## 2015-06-03 DIAGNOSIS — R609 Edema, unspecified: Secondary | ICD-10-CM

## 2015-06-03 NOTE — Progress Notes (Signed)
Location:  [facility]  Code Status: DNR Goals of Care: Advanced Directive information Type of Advance Directive: [place];Living will;Out of facility DNR (pink MOST or yellow form), Pre-existing out of facility DNR order (yellow form or pink MOST form): Yellow form placed in chart (order not valid for inpatient use)   Chief Complaint  Patient presents with  . Medical Management of Chronic Issues    A-Fib, CHF. No more bleeding or blacks stools since stopping Xarelto    HPI: Patient is a 81 y.o. male seen in the office today for medical management of chronic issues and follow up of hemoptysis and black stools.  Having some frequent diarrhea. Losing control of stools at times and soiling clothes. Uses immodium at times, but it is often too late. He then has  Notices alcohol can make it work. He drinks one drink a week with his friends, and a glass or two of wine with dinner occasionally. Notes the next day makes the diarrhea worse.   He goes to the bathroom frequently and is wondering if he can stop his lasix. He would actually like to decrease his overall number of pills he takes.   He is sleeping more. Napping in the morning and in the afternoon. Up at night to urinate- uses the urinal at his bedside, but otherwise sleeps ok- up about 4 times per night.   Review of Systems:  Review of Systems  Constitutional: Positive for malaise/fatigue.  HENT: Positive for hearing loss.   Respiratory: Negative.  Negative for hemoptysis, sputum production and shortness of breath.   Cardiovascular: Positive for palpitations and leg swelling.  Gastrointestinal: Positive for diarrhea. Negative for blood in stool.  Genitourinary: Positive for frequency. Negative for dysuria, urgency and hematuria.  Musculoskeletal:       Unsteady, uses walker    Past Medical History  Diagnosis Date  . Urinary frequency 10/01/2012  . Unspecified  constipation 07/04/2012  . Irritable bowel syndrome 11/14/2011  . 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 Unspecified hereditary and idiopathic peripheral neuropathy 02/2010  . Pain in joint, lower leg 02/2010  . Insomnia with sleep apnea, unspecified 02/2010  . Abnormality of gait 02/2010  . Diarrhea 02/2010  . Long term (current) use of anticoagulants 11/2009  . Atrial fibrillation 11/2009  . Deviated nasal septum 11/2009  . Other dyspnea and respiratory abnormality 11/2009  . Other abnormal blood chemistry 02/09/2009  . Edema 2010  . Dizziness and giddiness 2009  . Tension headache 2009  . Sebaceous cyst 2008  . Other B-complex deficiencies 2008  . Other malaise and fatigue 2007  . Essential and other specified forms of tremor 2007  . Unspecified essential hypertension 2007  . Reflux esophagitis 2007  . Diaphragmatic hernia without mention of obstruction or gangrene 2007  . Palpitations 2007  . Unspecified transient cerebral ischemia 2002  . Hypertrophy of prostate without urinary obstruction and other lower urinary tract symptoms (LUTS) 2002  . Undiagnosed cardiac murmurs 2002  . Anal and rectal polyp 1980  . Hyperglycemia 01/02/2013  . Chronic kidney disease, stage III (moderate) 08/07/2013    History reviewed. No pertinent past surgical history.  Allergies  Allergen Reactions  . Plavix [Clopidogrel Bisulfate]     indigestion   Medications: Patient's Medications  New Prescriptions   No medications on file  Previous Medications   ACETAMINOPHEN 500 MG COAPSULE    Take by mouth. Take 2 tablets once  daily as needed   B COMPLEX VITAMINS (B COMPLEX-B12 PO)    Take by mouth daily.   DOXAZOSIN (CARDURA) 4 MG TABLET    Take 4 mg by mouth. Take 1/2 tablets daily for prostate   FUROSEMIDE (LASIX) 20 MG TABLET    TAKE 1 TABLET DAILY FOR EDEMA   LOPERAMIDE (IMODIUM A-D) 2 MG TABLET    Take 2 mg by mouth. Take 1/2 one (1mg ) as needed to treat loose stools   METOPROLOL  SUCCINATE (TOPROL-XL) 50 MG 24 HR TABLET    Take one tablet  with or immediately following a meal.   METOPROLOL TARTRATE (LOPRESSOR) 25 MG TABLET    TAKE ONE-HALF TABLET (12.5 MG TOTAL) DAILY AS NEEDED   MULTIPLE VITAMINS-MINERALS (CENTRUM SILVER PO)    Take by mouth. Take one tablet once daily   POLYETHYLENE GLYCOL (MIRALAX / GLYCOLAX) PACKET    Take 17 g by mouth daily. As needed for constipation   RANITIDINE (ZANTAC) 75 MG TABLET    Take 75 mg by mouth. Take one daily to treat reflux  Modified Medications   No medications on file  Discontinued Medications   XARELTO 20 MG TABS TABLET    TAKE 1 TABLET DAILY FOR ANTICOAGULATION TO PREVENT STROKE    Physical Exam: Filed Vitals:   06/03/15 1050  BP: 120/64  Pulse: 52  Temp: 98.8 F (37.1 C)  TempSrc: Oral  Weight: 214 lb (97.07 kg)  SpO2: 98%   Physical Exam  Constitutional: He is oriented to person, place, and time. He appears well-developed and well-nourished. No distress.  Cardiovascular: Intact distal pulses.   Murmur heard. Bradycardic, irregular rhythm, BLE 1+ edema L>R  Pulmonary/Chest: Effort normal and breath sounds normal. He has no wheezes.  Abdominal: Soft. Bowel sounds are normal. He exhibits no distension. There is no tenderness.  Musculoskeletal:  Unsteady gait, uses walker at all times  Neurological: He is alert and oriented to person, place, and time.  Essential tremors  Skin: Skin is warm and dry. He is not diaphoretic.  Bilateral lower extremities with dry flaking skin from below knees to above ankles.  Psychiatric: He has a normal mood and affect. His behavior is normal. Judgment and thought content normal.    Labs reviewed: Basic Metabolic Panel:  Recent Labs  12/23/14 12/25/14 01/02/15 04/22/15  NA 140 140 141 143  K 4.3 4.0 4.1 3.7  BUN 25* 22* 30* 30*  CREATININE 1.5* 1.3 1.5* 1.4*  TSH 5.03  --   --   --    Liver Function Tests:  Recent Labs  04/22/15  AST 12*  ALT 12  ALKPHOS 57   No  results for input(s): LIPASE, AMYLASE in the last 8760 hours. No results for input(s): AMMONIA in the last 8760 hours. CBC:  Recent Labs  12/23/14 12/25/14 04/22/15  WBC 3.2 5.4 4.8  HGB 13.5 13.4* 12.4*  HCT 40* 39* 36*  PLT 111* 97* 141*     Assessment/Plan 1. Rectal bleeding -Resolved after stopping Xarelto -Continue off Xarelto, likely will not restart  2. Hemoptysis -Resolved after stopping Xarelto -Contine off Xarelto, likely will not restart  3. Loose stools -1 time per week -Possibly related to alcohol or Miralax use -Recommend starting daily fiber supplement  4. Chronic atrial fibrillation -Rate controlled with medication -Irregular rhythm, palpitations at times -Anticoagulation stopped due to rectal bleeding and hemoptysis -Patient aware of stroke risk, signs and symptoms -Continue daily and as needed metoprolol  5. BPH (benign prostatic  hyperplasia) -Asymptomatic due to use of doxazosin -Discussed stopping doxazosin due to patient's desire to take less medication and risk of increased urine retention -Patient would rather continue doxazosin than risk urine retention  6. Chronic congestive heart failure, unspecified congestive heart failure type -Currently managed with Lasix and metoprolol -Recommend decreasing lasix to 20mg  every other day due to patient frequently up at night and multiple times during day to urinate -Patient aware that decreasing lasix may increase swelling in legs and possibly lead to fluid collection in his lungs. -Educated patient to call for signs and symptoms- shortness of breath, cough, increased swelling in legs   7. Edema -1-2+ in both lower extremities -Doesn't bother patient -Currently managed with Lasix 20mg  daily- but will decrease to lasix 20mg  every other day to decrease frequency of urination -Patient educated to elevate LE's when possible, call for increase in swelling that he doesn't tolerate  8. Medication  management -Patient desires to take less medicine -Recommend stopping Vitamin B and multivitamins, he's not really taking them regularly anyway -Decrease lasix to every other day -Will continue to monitor and evaluate medication list at each visit     Next appt: Followup in December with Dr. Mariea Clonts for medical management of chronic issues  Mariana Kaufman, RN, BSN [employer] [address], [place] [address] Office Phone:  [phone] Office Fax:  [phone]

## 2015-09-02 ENCOUNTER — Encounter: Payer: Self-pay | Admitting: Internal Medicine

## 2015-09-09 ENCOUNTER — Encounter: Payer: Self-pay | Admitting: Internal Medicine

## 2015-09-09 ENCOUNTER — Non-Acute Institutional Stay: Payer: Medicare Other | Admitting: Internal Medicine

## 2015-09-09 VITALS — BP 124/64 | HR 57 | Temp 98.0°F | Ht 73.0 in | Wt 214.0 lb

## 2015-09-09 DIAGNOSIS — I482 Chronic atrial fibrillation, unspecified: Secondary | ICD-10-CM

## 2015-09-09 DIAGNOSIS — Z23 Encounter for immunization: Secondary | ICD-10-CM | POA: Diagnosis not present

## 2015-09-09 DIAGNOSIS — R195 Other fecal abnormalities: Secondary | ICD-10-CM | POA: Diagnosis not present

## 2015-09-09 DIAGNOSIS — K625 Hemorrhage of anus and rectum: Secondary | ICD-10-CM | POA: Diagnosis not present

## 2015-09-09 DIAGNOSIS — I509 Heart failure, unspecified: Secondary | ICD-10-CM | POA: Diagnosis not present

## 2015-09-09 DIAGNOSIS — K5901 Slow transit constipation: Secondary | ICD-10-CM | POA: Diagnosis not present

## 2015-09-09 MED ORDER — METOPROLOL SUCCINATE ER 50 MG PO TB24: ORAL_TABLET | ORAL | Status: DC

## 2015-09-09 NOTE — Progress Notes (Signed)
Patient ID: Gary [name], male   DOB: [birth year], 82 y.o.   MRN: [id number]   Location: [facility] Provider: Zenda Herskowitz L. Mariea Clonts, D.O., C.M.D.  Code Status: DNR Goals of Care: Advanced Directive information Does patient have an advance directive?: Yes, Type of Advance Directive: [place];Living will;Out of facility DNR (pink MOST or yellow form), Pre-existing out of facility DNR order (yellow form or pink MOST form): Yellow form placed in chart (order not valid for inpatient use)  Chief Complaint  Patient presents with  . Medical Management of Chronic Issues    A-Fib, CHF medication management    HPI: Patient is a 82 y.o. male seen in the office today for medical mgt of chronic diseases.     Afib, chf:  A couple of small episodes of palpitations but not very bothersome.  Mild dyspnea on exertion, but does not do a lot of exertion.  Occasionally doing chores in the apt, he might notice this.  BMs:  Goes from difficulty bms/constipated to a liquid bm frequently with soiling his underwear occasionally.  Taking miralax every other day.  May have to change his clothes 2-3x on a bad day.  Will use imodium when has a bad day.  Rarely uses gaviscon for acid stomach.  A long time ago, he used Child psychotherapist.  He does drink a lot liquids.  No bleeding now.  Due for prevnar.  Review of Systems:  Review of Systems  Constitutional: Negative for fever, chills and malaise/fatigue.  HENT: Positive for hearing loss. Negative for congestion.   Eyes: Negative for blurred vision.       Glasses  Respiratory: Negative for shortness of breath.        Some dyspnea on exertion  Cardiovascular: Positive for leg swelling. Negative for chest pain.       Edema today when not using compression hose  Gastrointestinal: Positive for heartburn, diarrhea and constipation. Negative for abdominal pain, blood in stool and melena.       Rare heartburn  Genitourinary: Negative for dysuria.    Musculoskeletal: Negative for falls.  Skin: Negative for rash.  Neurological: Negative for dizziness, loss of consciousness and weakness.  Psychiatric/Behavioral: Positive for memory loss. Negative for depression.       Mild memory loss    Past Medical History  Diagnosis Date  . Urinary frequency 10/01/2012  . Unspecified constipation 07/04/2012  . Irritable bowel syndrome 11/14/2011  . 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 Unspecified hereditary and idiopathic peripheral neuropathy 02/2010  . Pain in joint, lower leg 02/2010  . Insomnia with sleep apnea, unspecified 02/2010  . Abnormality of gait 02/2010  . Diarrhea 02/2010  . Long term (current) use of anticoagulants 11/2009  . Atrial fibrillation ([place]) 11/2009  . Deviated nasal septum 11/2009  . Other dyspnea and respiratory abnormality 11/2009  . Other abnormal blood chemistry 02/09/2009  . Edema 2010  . Dizziness and giddiness 2009  . Tension headache 2009  . Sebaceous cyst 2008  . Other B-complex deficiencies 2008  . Other malaise and fatigue 2007  . Essential and other specified forms of tremor 2007  . Unspecified essential hypertension 2007  . Reflux esophagitis 2007  . Diaphragmatic hernia without mention of obstruction or gangrene 2007  . Palpitations 2007  . Unspecified transient cerebral ischemia 2002  . Hypertrophy of prostate without urinary obstruction and other lower urinary tract symptoms (LUTS) 2002  . Undiagnosed cardiac murmurs 2002  . Anal and rectal  polyp 1980  . Hyperglycemia 01/02/2013  . Chronic kidney disease, stage III (moderate) 08/07/2013    History reviewed. No pertinent past surgical history.  Allergies  Allergen Reactions  . Plavix [Clopidogrel Bisulfate]     indigestion      Medication List       This list is accurate as of: 09/09/15 12:00 PM.  Always use your most recent med list.               Acetaminophen 500 MG coapsule  Take by mouth. Take 2 tablets once  daily as needed     B COMPLEX-B12 PO  Take by mouth daily.     CENTRUM SILVER PO  Take by mouth. Take one tablet once daily     doxazosin 4 MG tablet  Commonly known as:  CARDURA  Take 4 mg by mouth. Take 1/2 tablets daily for prostate     furosemide 20 MG tablet  Commonly known as:  LASIX  Take 20 mg by mouth. Take one tablet every other day     loperamide 2 MG tablet  Commonly known as:  IMODIUM A-D  Take 2 mg by mouth. Take 1/2 one (1mg ) as needed to treat loose stools     metoprolol succinate 50 MG 24 hr tablet  Commonly known as:  TOPROL-XL  Take one tablet  with or immediately following a meal.     metoprolol tartrate 25 MG tablet  Commonly known as:  LOPRESSOR  TAKE ONE-HALF TABLET (12.5 MG TOTAL) DAILY AS NEEDED     polyethylene glycol packet  Commonly known as:  MIRALAX / GLYCOLAX  Take 17 g by mouth. Take every other day, as needed for constipation     ranitidine 75 MG tablet  Commonly known as:  ZANTAC  Take 75 mg by mouth. Take one daily to treat reflux        Health Maintenance  Topic Date Due  . ZOSTAVAX  07-23-201979  . TETANUS/TDAP  09/05/1988  . PNA vac Low Risk Adult (2 of 2 - PCV13) 09/05/2009  . INFLUENZA VACCINE  04/05/2016    Physical Exam: Filed Vitals:   09/09/15 1144  BP: 124/64  Pulse: 57  Temp: 98 F (36.7 C)  TempSrc: Oral  Height: 6\' 1"  (1.854 m)  Weight: 214 lb (97.07 kg)  SpO2: 98%   Body mass index is 28.24 kg/(m^2). Physical Exam  Constitutional: He is oriented to person, place, and time. He appears well-developed and well-nourished. No distress.  Cardiovascular:  Bilateral 2+ edema (not wearing his compression hose today); irreg irreg  Pulmonary/Chest: Effort normal and breath sounds normal. No respiratory distress. He has no wheezes.  Abdominal: Soft. Bowel sounds are normal. He exhibits no distension. There is no tenderness.  Musculoskeletal: Normal range of motion.  Using scooter to go long distances, walker at home   Neurological: He is alert and oriented to person, place, and time.  Skin: Skin is warm and dry.  Psychiatric: He has a normal mood and affect.    Labs reviewed: Basic Metabolic Panel:  Recent Labs  12/23/14 12/25/14 01/02/15 04/22/15  NA 140 140 141 143  K 4.3 4.0 4.1 3.7  BUN 25* 22* 30* 30*  CREATININE 1.5* 1.3 1.5* 1.4*  TSH 5.03  --   --   --    Liver Function Tests:  Recent Labs  04/22/15  AST 12*  ALT 12  ALKPHOS 57   No results for input(s): LIPASE, AMYLASE in the last  8760 hours. No results for input(s): AMMONIA in the last 8760 hours. CBC:  Recent Labs  12/23/14 12/25/14 04/22/15  WBC 3.2 5.4 4.8  HGB 13.5 13.4* 12.4*  HCT 40* 39* 36*  PLT 111* 97* 141*    Assessment/Plan 1. Loose stools -recommended he use a fiber supplement instead of miralax for now -if he goes more than 2 days w/o a bm, may use miralax, but do not take regularly due to these accidents he's having -does hydrate well -monitor  2. Slow transit constipation -less of a problem--seems it's more from imodium use for #1 -is clear he does not want further evaluation of these problems with endoscopy  3. Chronic atrial fibrillation (HCC) -stable, very few periods of racing since last visit  4. Rectal bleeding -no recent bleeding, refused workup, is off plavix  5. Chronic congestive heart failure, unspecified congestive heart failure type (HCC) -stable, some chronic dyspnea on exertion--class 2-3 symptoms at this point plus deconditioning -no changes today -wear compression hose when up and about  6. Need for vaccination with 13-polyvalent pneumococcal conjugate vaccine - Pneumococcal conjugate vaccine 13-valent given  Labs/tests ordered:   Orders Placed This Encounter  Procedures  . Pneumococcal conjugate vaccine 13-valent    Next appt:  6 mos for annual with labs before   Nashira Mcglynn L. Lenwood Balsam, D.O. [facility] [address], [place] [address] Cell Phone (Mon-Fri 8am-5pm):  [phone] On Call:  [phone] & follow prompts after 5pm & weekends Office Phone:  [phone] Office Fax:  [phone]

## 2015-11-26 ENCOUNTER — Other Ambulatory Visit: Payer: Self-pay | Admitting: Internal Medicine

## 2016-02-24 ENCOUNTER — Other Ambulatory Visit: Payer: Self-pay | Admitting: Internal Medicine

## 2016-02-25 DIAGNOSIS — Z0100 Encounter for examination of eyes and vision without abnormal findings: Secondary | ICD-10-CM | POA: Diagnosis not present

## 2016-02-25 DIAGNOSIS — Z961 Presence of intraocular lens: Secondary | ICD-10-CM | POA: Diagnosis not present

## 2016-03-01 DIAGNOSIS — I10 Essential (primary) hypertension: Secondary | ICD-10-CM | POA: Diagnosis not present

## 2016-03-01 DIAGNOSIS — R739 Hyperglycemia, unspecified: Secondary | ICD-10-CM | POA: Diagnosis not present

## 2016-03-01 DIAGNOSIS — E538 Deficiency of other specified B group vitamins: Secondary | ICD-10-CM | POA: Diagnosis not present

## 2016-03-01 DIAGNOSIS — N183 Chronic kidney disease, stage 3 (moderate): Secondary | ICD-10-CM | POA: Diagnosis not present

## 2016-03-01 LAB — HEPATIC FUNCTION PANEL
ALT: 10 U/L (ref 10–40)
AST: 11 U/L — AB (ref 14–40)
Alkaline Phosphatase: 69 U/L (ref 25–125)
Bilirubin, Total: 0.8 mg/dL

## 2016-03-01 LAB — BASIC METABOLIC PANEL
BUN: 25 mg/dL — AB (ref 4–21)
Creatinine: 1.3 mg/dL (ref 0.6–1.3)
Glucose: 110 mg/dL
Potassium: 4.1 mmol/L (ref 3.4–5.3)
Sodium: 142 mmol/L (ref 137–147)

## 2016-03-01 LAB — CBC AND DIFFERENTIAL
HCT: 44 % (ref 41–53)
Hemoglobin: 13.5 g/dL (ref 13.5–17.5)
Platelets: 128 10*3/uL — AB (ref 150–399)
WBC: 4.9 10^3/mL

## 2016-03-01 LAB — LIPID PANEL
Cholesterol: 119 mg/dL (ref 0–200)
HDL: 56 mg/dL (ref 35–70)
LDL Cholesterol: 52 mg/dL
Triglycerides: 54 mg/dL (ref 40–160)

## 2016-03-01 LAB — HEMOGLOBIN A1C: Hemoglobin A1C: 5.3

## 2016-03-02 ENCOUNTER — Encounter: Payer: Self-pay | Admitting: Internal Medicine

## 2016-03-03 ENCOUNTER — Telehealth: Payer: Self-pay | Admitting: *Deleted

## 2016-03-03 NOTE — Telephone Encounter (Signed)
Left message to have patient return my call about her lab results, per Dr. Mariea Clonts patient's labs are ok. Will scan in lab results

## 2016-03-03 NOTE — Telephone Encounter (Signed)
Spoke with patient and advised results   

## 2016-03-09 ENCOUNTER — Encounter: Payer: Self-pay | Admitting: Internal Medicine

## 2016-03-09 ENCOUNTER — Non-Acute Institutional Stay: Payer: Medicare Other | Admitting: Internal Medicine

## 2016-03-09 VITALS — BP 110/60 | HR 49 | Temp 98.9°F | Ht 73.0 in | Wt 219.0 lb

## 2016-03-09 DIAGNOSIS — K5901 Slow transit constipation: Secondary | ICD-10-CM

## 2016-03-09 DIAGNOSIS — K625 Hemorrhage of anus and rectum: Secondary | ICD-10-CM

## 2016-03-09 DIAGNOSIS — I509 Heart failure, unspecified: Secondary | ICD-10-CM

## 2016-03-09 DIAGNOSIS — R195 Other fecal abnormalities: Secondary | ICD-10-CM | POA: Diagnosis not present

## 2016-03-09 DIAGNOSIS — I482 Chronic atrial fibrillation, unspecified: Secondary | ICD-10-CM

## 2016-03-09 DIAGNOSIS — I959 Hypotension, unspecified: Secondary | ICD-10-CM | POA: Diagnosis not present

## 2016-03-09 MED ORDER — METOPROLOL SUCCINATE ER 25 MG PO TB24: ORAL_TABLET | ORAL | Status: DC

## 2016-03-09 NOTE — Progress Notes (Signed)
Location:   [place] of Service:  Clinic (12)  Provider: [place] Lehnen L. Mariea Clonts, D.O., C.M.D.  Code Status: DNR Goals of Care:  Advanced Directives 03/09/2016  Does patient have an advance directive? Yes  Type of Par[employer] of [place];Out of facility DNR (pink MOST or yellow form)  Copy of advanced directive(s) in chart? Yes  Pre-existing out of facility DNR order (yellow form or pink MOST form) Yellow form placed in chart (order not valid for inpatient use)     Chief Complaint  Patient presents with  . Medical Management of Chronic Issues    6 mth follow-up    HPI: Patient is a 82 y.o. male seen today for medical management of chronic diseases.    Nothing new.  Family is happy with him and he is happy with them.    No palpitation spells.  Does notice his pulse goes up quickly if he exercises.  That makes him short of breath.  Today, bp and pulse low.  He has had two episodes of dizziness on standing since he was here last.  Dining room to scooter (50 ft) is short of breath and must take 2 big breaths.  At night, he is short of breath at night during a dream when he was running.    Bowels still oscillate between constipation and diarrhea.  Diarrhea will be two times per month and may have incontinence.    Past Medical History  Diagnosis Date  . Urinary frequency 10/01/2012  . Unspecified constipation 07/04/2012  . Irritable bowel syndrome 11/14/2011  . 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 Unspecified hereditary and idiopathic peripheral neuropathy 02/2010  . Pain in joint, lower leg 02/2010  . Insomnia with sleep apnea, unspecified 02/2010  . Abnormality of gait 02/2010  . Diarrhea 02/2010  . Long term (current) use of anticoagulants 11/2009  . Atrial fibrillation ([place]) 11/2009  . Deviated nasal septum 11/2009  . Other dyspnea and respiratory abnormality 11/2009  . Other abnormal blood chemistry 02/09/2009  . Edema 2010  .  Dizziness and giddiness 2009  . Tension headache 2009  . Sebaceous cyst 2008  . Other B-complex deficiencies 2008  . Other malaise and fatigue 2007  . Essential and other specified forms of tremor 2007  . Unspecified essential hypertension 2007  . Reflux esophagitis 2007  . Diaphragmatic hernia without mention of obstruction or gangrene 2007  . Palpitations 2007  . Unspecified transient cerebral ischemia 2002  . Hypertrophy of prostate without urinary obstruction and other lower urinary tract symptoms (LUTS) 2002  . Undiagnosed cardiac murmurs 2002  . Anal and rectal polyp 1980  . Hyperglycemia 01/02/2013  . Chronic kidney disease, stage III (moderate) 08/07/2013    No past surgical history on file.  Allergies  Allergen Reactions  . Plavix [Clopidogrel Bisulfate]     indigestion      Medication List       This list is accurate as of: 03/09/16 11:41 AM.  Always use your most recent med list.               Acetaminophen 500 MG coapsule  Take by mouth. Take 2 tablets once daily as needed     B COMPLEX-B12 PO  Take by mouth daily.     CENTRUM SILVER PO  Take by mouth daily.     doxazosin 4 MG tablet  Commonly known as:  CARDURA  Take 4 mg by mouth. Take 1/2  tablets daily for prostate     furosemide 20 MG tablet  Commonly known as:  LASIX  Take 20 mg by mouth every other day. Monday, Wednesday, Friday     loperamide 2 MG tablet  Commonly known as:  IMODIUM A-D  Take 2 mg by mouth. Take 1/2 one (1mg ) as needed to treat loose stools     metoprolol succinate 50 MG 24 hr tablet  Commonly known as:  TOPROL-XL  Take one tablet  with or immediately following a meal.     metoprolol tartrate 25 MG tablet  Commonly known as:  LOPRESSOR  TAKE ONE-HALF TABLET (12.5 MG TOTAL) DAILY AS NEEDED     polyethylene glycol packet  Commonly known as:  MIRALAX / GLYCOLAX  Take 17 g by mouth every other day.     ranitidine 75 MG tablet  Commonly known as:  ZANTAC  Take 75 mg by  mouth daily.        Review of Systems:  Review of Systems  Constitutional: Positive for malaise/fatigue. Negative for fever and chills.       Wt gain  HENT: Positive for hearing loss.   Eyes: Negative for blurred vision.       Glasses  Respiratory: Positive for shortness of breath. Negative for cough and wheezing.   Cardiovascular: Positive for palpitations and leg swelling. Negative for chest pain, orthopnea, claudication and PND.  Gastrointestinal: Positive for diarrhea and constipation. Negative for abdominal pain, blood in stool and melena.  Genitourinary: Positive for urgency and frequency. Negative for dysuria.  Musculoskeletal: Negative for falls.  Neurological: Positive for dizziness and weakness.  Endo/Heme/Allergies: Bruises/bleeds easily.  Psychiatric/Behavioral: Negative for depression and memory loss. The patient is not nervous/anxious.     Health Maintenance  Topic Date Due  . ZOSTAVAX  Jul 05, 201979  . TETANUS/TDAP  09/05/1988  . INFLUENZA VACCINE  04/05/2016  . PNA vac Low Risk Adult  Completed    Physical Exam: Filed Vitals:   03/09/16 1126  BP: 110/60  Pulse: 49  Temp: 98.9 F (37.2 C)  TempSrc: Oral  Height: 6\' 1"  (1.854 m)  Weight: 219 lb (99.338 kg)  SpO2: 97%   Body mass index is 28.9 kg/(m^2). Physical Exam  Constitutional: He is oriented to person, place, and time. He appears well-developed and well-nourished. No distress.  HENT:  Head: Normocephalic and atraumatic.  Hearing aides, still HOH  Eyes:  glasses  Cardiovascular: Normal rate, regular rhythm, normal heart sounds and intact distal pulses.   2+ edema bilateral LEs  Pulmonary/Chest: Effort normal and breath sounds normal. No respiratory distress. He has no rales.  Abdominal: Soft. Bowel sounds are normal. He exhibits no distension and no mass. There is no tenderness.  Musculoskeletal: Normal range of motion.  Walks with lightweight walker short distances, but uses scooter long  distances  Neurological: He is alert and oriented to person, place, and time.  Skin: Skin is warm and dry.  Two small open areas on right shin that are slow to heal; mild erythema and venous insufficiency  Psychiatric: He has a normal mood and affect.    Labs reviewed: Basic Metabolic Panel:  Recent Labs  04/22/15 03/01/16 0500  NA 143 142  K 3.7 4.1  BUN 30* 25*  CREATININE 1.4* 1.3   Liver Function Tests:  Recent Labs  04/22/15 03/01/16 0500  AST 12* 11*  ALT 12 10  ALKPHOS 57 69   No results for input(s): LIPASE, AMYLASE in the last 8760 hours.  No results for input(s): AMMONIA in the last 8760 hours. CBC:  Recent Labs  04/22/15 03/01/16 0500  WBC 4.8 4.9  HGB 12.4* 13.5  HCT 36* 44  PLT 141* 128*   Lipid Panel:  Recent Labs  03/01/16 0500  CHOL 119  HDL 56  LDLCALC 52  TRIG 54   Lab Results  Component Value Date   HGBA1C 5.3 03/01/2016     Assessment/Plan 1. Chronic atrial fibrillation (HCC) -due to his hypotension, 2 dizzy spells and bradycardia, will reduce toprol XL to 25mg  (advised he may not cut in half due to extended release) -recommended bp and hr checks twice weekly with Mliss Sax for a couple of weeks to make sure bp not high now instead -he has always been clear that he does not want a pacer/defibrillator or any aggressive interventions at 82 yo -he has not used his prn lopressor for palpitations  2. Chronic congestive heart failure, unspecified congestive heart failure type (HCC) -cont lasix, but appears he may need an increase -I'm hesitant to do this with his bp already on the low side and h/o not drinking adequate fluids   3. Rectal bleeding -no recent spells since off anticoagulation  4. Slow transit constipation -cont miralax when needed, but cannot get into a regular routine with this--seems to then get diarrhea and use imodium and gets constipated again  5. Loose stools -as part of a cycle with constipation related to the  meds -I've made suggestions, but he falls back into the same routine  6. Hypotension, unspecified hypotension type -suspect toprol 50mg  is too much so reduced to 25mg  today with f/u readings with Mliss Sax and I will see him again in 4 mos b/c he won't agree to come sooner  Copy of labs given  Labs/tests ordered:  No new Next appt: 07/13/2016 f/u on hypotension, bradycardia, edema of legs  Loneta Tamplin L. Sophiya Morello, D.O. [facility] [address], [place] [address] Cell Phone (Mon-Fri 8am-5pm):  [phone] On Call:  [phone] & follow prompts after 5pm & weekends Office Phone:  [phone] Office Fax:  [phone]

## 2016-03-09 NOTE — Patient Instructions (Signed)
Please go to see Mliss Sax twice a week to get your blood pressure and pulse checked.  Also, keep a close eye on your swelling of your ankles.  We may need to do something about it if it continues to worsen.

## 2016-03-23 ENCOUNTER — Encounter: Payer: Self-pay | Admitting: Internal Medicine

## 2016-03-30 ENCOUNTER — Other Ambulatory Visit: Payer: Self-pay | Admitting: *Deleted

## 2016-03-30 MED ORDER — METOPROLOL TARTRATE 25 MG PO TABS: 25.0000 mg | ORAL_TABLET | Freq: Every day | ORAL | 3 refills | Status: DC

## 2016-04-11 ENCOUNTER — Encounter: Payer: Self-pay | Admitting: Nurse Practitioner

## 2016-04-11 ENCOUNTER — Ambulatory Visit
Admission: RE | Admit: 2016-04-11 | Discharge: 2016-04-11 | Disposition: A | Discharge status: Home / Self Care | Payer: Medicare Other | Source: Ambulatory Visit | Attending: Nurse Practitioner | Admitting: Nurse Practitioner

## 2016-04-11 ENCOUNTER — Ambulatory Visit (INDEPENDENT_AMBULATORY_CARE_PROVIDER_SITE_OTHER): Payer: Medicare Other | Admitting: Nurse Practitioner

## 2016-04-11 VITALS — BP 138/78 | HR 68 | Temp 98.3°F | Resp 17 | Ht 73.0 in | Wt 215.0 lb

## 2016-04-11 DIAGNOSIS — R05 Cough: Secondary | ICD-10-CM | POA: Diagnosis not present

## 2016-04-11 DIAGNOSIS — J209 Acute bronchitis, unspecified: Secondary | ICD-10-CM | POA: Diagnosis not present

## 2016-04-11 MED ORDER — DM-GUAIFENESIN ER 30-600 MG PO TB12: 1.0000 | ORAL_TABLET | Freq: Two times a day (BID) | ORAL | 0 refills | Status: DC

## 2016-04-11 MED ORDER — DOXYCYCLINE HYCLATE 100 MG PO TABS: 100.0000 mg | ORAL_TABLET | Freq: Two times a day (BID) | ORAL | 0 refills | Status: DC

## 2016-04-11 NOTE — Progress Notes (Signed)
PCP: Hollace Kinnier, DO  Advanced Directive information Does patient have an advance directive?: Yes, Type of Advance Directive: [place];Living will;Out of facility DNR (pink MOST or yellow form), Does patient want to make changes to advanced directive?: No - Patient declined  Allergies  Allergen Reactions  . Plavix [Clopidogrel Bisulfate]     indigestion    Chief Complaint  Patient presents with  . Acute Visit    Cough and congestion x 1week. constant cough and headache     HPI: Patient is a 82 y.o. male seen in the office today due to cough and congestion. Symptoms started 1 week ago with sore throat, hurts when he swallows. Now he is constantly coughing, productive cough with mucous. Chest hurting from coughing  Most of the congestion in his chest.  Not able to sleep due to the cough.  Muscle are sore due to cough.  Has been taking tylenol due to headache. Taking 1000 mg tylenol every 4 hours  Has not taken anything for cough.  No fevers but feels exhausted No appetite.  Shortness of breath, esp when hes coughing  Had bronchitis in the past and seems to have the same thing.   Review of Systems:  Review of Systems  Constitutional: Positive for activity change, appetite change and fatigue. Negative for fever.  HENT: Positive for congestion, postnasal drip, rhinorrhea and sore throat. Negative for ear pain and sinus pressure.   Respiratory: Positive for cough.   Cardiovascular: Positive for palpitations (irreg heart beat, no increase in this). Negative for chest pain.  Neurological: Positive for dizziness (chronic), weakness and headaches. Negative for light-headedness.    Past Medical History:  Diagnosis Date  . Abnormality of gait 02/2010  . Anal and rectal polyp 1980  . Atrial fibrillation ([place]) 11/2009  . Chronic kidney disease, stage III (moderate) 08/07/2013  . Deviated nasal septum 11/2009  . Diaphragmatic hernia without mention of obstruction  or gangrene 2007  . Diarrhea 02/2010  . Dizziness and giddiness 2009  . Edema 2010  . Essential and other specified forms of tremor 2007  . 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 Hyperglycemia 01/02/2013  . Hypertrophy of prostate without urinary obstruction and other lower urinary tract symptoms (LUTS) 2002  . Insomnia with sleep apnea, unspecified 02/2010  . Irritable bowel syndrome 11/14/2011  . Long term (current) use of anticoagulants 11/2009  . Other abnormal blood chemistry 02/09/2009  . Other B-complex deficiencies 2008  . Other dyspnea and respiratory abnormality 11/2009  . Other malaise and fatigue 2007  . Pain in joint, lower leg 02/2010  . Palpitations 2007  . Reflux esophagitis 2007  . Sebaceous cyst 2008  . Tension headache 2009  . Undiagnosed cardiac murmurs 2002  . Unspecified constipation 07/04/2012  . Unspecified essential hypertension 2007  . Unspecified hereditary and idiopathic peripheral neuropathy 02/2010  . Unspecified transient cerebral ischemia 2002  . Urinary frequency 10/01/2012   History reviewed. No pertinent surgical history. Social History:   reports that he has never smoked. He has never used smokeless tobacco. He reports that he drinks alcohol. He reports that he does not use drugs.  Family History  Problem Relation Age of Onset  . Congestive Heart Failure Mother   . Diabetes Father   . Heart disease Father   . Cancer Sister     lung   . Kidney disease Sister     Medications: Patient's Medications  New Prescriptions   No  medications on file  Previous Medications   ACETAMINOPHEN 500 MG COAPSULE    Take by mouth. Take 2 tablets once daily as needed   DOXAZOSIN (CARDURA) 4 MG TABLET    Take 4 mg by mouth. Take 1/2 tablets daily for prostate   FUROSEMIDE (LASIX) 20 MG TABLET    Take 20 mg by mouth every other day. Monday, Wednesday, Friday   LOPERAMIDE (IMODIUM A-D) 2 MG TABLET    Take 2 mg by mouth. Take 1/2 one (1mg ) as  needed to treat loose stools   METOPROLOL TARTRATE (LOPRESSOR) 25 MG TABLET    Take 1 tablet (25 mg total) by mouth daily.   MULTIPLE VITAMINS-MINERALS (CENTRUM SILVER PO)    Take by mouth daily.    POLYETHYLENE GLYCOL (MIRALAX / GLYCOLAX) PACKET    Take 17 g by mouth every other day.    RANITIDINE (ZANTAC) 75 MG TABLET    Take 75 mg by mouth daily.    VITAMIN B-12 (CYANOCOBALAMIN) 1000 MCG TABLET    Take 1,000 mcg by mouth daily.  Modified Medications   No medications on file  Discontinued Medications   No medications on file     Physical Exam:  There were no vitals filed for this visit. There is no height or weight on file to calculate BMI.  Physical Exam  Constitutional: He is oriented to person, place, and time. He appears well-developed and well-nourished. No distress.  HENT:  Head: Normocephalic and atraumatic.  Right Ear: External ear normal.  Left Ear: External ear normal.  Nose: Mucosal edema and rhinorrhea present.  Mouth/Throat: Oropharynx is clear and moist. No oropharyngeal exudate.  Hearing aides, still HOH  Eyes: Conjunctivae and EOM are normal. Pupils are equal, round, and reactive to light.  glasses  Cardiovascular: Normal rate, regular rhythm and normal heart sounds.   2+ edema bilateral LEs  Pulmonary/Chest: Effort normal and breath sounds normal. No respiratory distress. He has no rales.  Abdominal: Soft. Bowel sounds are normal. He exhibits no distension. There is no tenderness.  Musculoskeletal: Normal range of motion.  Walks with lightweight walker short distances, but uses scooter long distances  Neurological: He is alert and oriented to person, place, and time.  Skin: Skin is warm and dry.  Psychiatric: He has a normal mood and affect.    Labs reviewed: Basic Metabolic Panel:  Recent Labs  04/22/15 03/01/16 0500  NA 143 142  K 3.7 4.1  BUN 30* 25*  CREATININE 1.4* 1.3   Liver Function Tests:  Recent Labs  04/22/15 03/01/16 0500  AST 12*  11*  ALT 12 10  ALKPHOS 57 69   No results for input(s): LIPASE, AMYLASE in the last 8760 hours. No results for input(s): AMMONIA in the last 8760 hours. CBC:  Recent Labs  04/22/15 03/01/16 0500  WBC 4.8 4.9  HGB 12.4* 13.5  HCT 36* 44  PLT 141* 128*   Lipid Panel:  Recent Labs  03/01/16 0500  CHOL 119  HDL 56  LDLCALC 52  TRIG 54   TSH: No results for input(s): TSH in the last 8760 hours. A1C: Lab Results  Component Value Date   HGBA1C 5.3 03/01/2016     Assessment/Plan 1. Acute bronchitis, unspecified organism -encouraged hydration and proper nutrition -will get chest xray to rule out pneumonia.  - dextromethorphan-guaiFENesin (MUCINEX DM) 30-600 MG 12hr tablet; Take 1 tablet by mouth 2 (two) times daily. For cough and congestion - doxycycline (VIBRA-TABS) 100 MG tablet; Take 1  tablet (100 mg total) by mouth 2 (two) times daily.  Dispense: 14 tablet; Refill: 0 -to notify if symptoms worsen or fail to improved To follow up with Dr Mariea Clonts in next week at [facility] clinic   [place]. Harle Battiest  [facility] [phone] 8 am - 5 pm) [phone] (after hours)

## 2016-04-11 NOTE — Patient Instructions (Addendum)
To get chest xray to rule out pneumonia mucinex DM by mouth twice daily with full glass of water Decrease tylenol to 325 mg tablet- can take 2 tablets every 4 hours MAX of 9 tablets in 24 hours  To start doxycycline 100 mg by mouth twice daily for 7 days    Acute Bronchitis Bronchitis is inflammation of the airways that extend from the windpipe into the lungs (bronchi). The inflammation often causes mucus to develop. This leads to a cough, which is the most common symptom of bronchitis.  In acute bronchitis, the condition usually develops suddenly and goes away over time, usually in a couple weeks. Smoking, allergies, and asthma can make bronchitis worse. Repeated episodes of bronchitis may cause further lung problems.  CAUSES Acute bronchitis is most often caused by the same virus that causes a cold. The virus can spread from person to person (contagious) through coughing, sneezing, and touching contaminated objects. SIGNS AND SYMPTOMS   Cough.   Fever.   Coughing up mucus.   Body aches.   Chest congestion.   Chills.   Shortness of breath.   Sore throat.  DIAGNOSIS  Acute bronchitis is usually diagnosed through a physical exam. Your health care provider will also ask you questions about your medical history. Tests, such as chest X-rays, are sometimes done to rule out other conditions.  TREATMENT  Acute bronchitis usually goes away in a couple weeks. Oftentimes, no medical treatment is necessary. Medicines are sometimes given for relief of fever or cough. Antibiotic medicines are usually not needed but may be prescribed in certain situations. In some cases, an inhaler may be recommended to help reduce shortness of breath and control the cough. A cool mist vaporizer may also be used to help thin bronchial secretions and make it easier to clear the chest.  HOME CARE INSTRUCTIONS  Get plenty of rest.   Drink enough fluids to keep your urine clear or pale yellow (unless you  have a medical condition that requires fluid restriction). Increasing fluids may help thin your respiratory secretions (sputum) and reduce chest congestion, and it will prevent dehydration.   Take medicines only as directed by your health care provider.  If you were prescribed an antibiotic medicine, finish it all even if you start to feel better.  Avoid smoking and secondhand smoke. Exposure to cigarette smoke or irritating chemicals will make bronchitis worse. If you are a smoker, consider using nicotine gum or skin patches to help control withdrawal symptoms. Quitting smoking will help your lungs heal faster.   Reduce the chances of another bout of acute bronchitis by washing your hands frequently, avoiding people with cold symptoms, and trying not to touch your hands to your mouth, nose, or eyes.   Keep all follow-up visits as directed by your health care provider.  SEEK MEDICAL CARE IF: Your symptoms do not improve after 1 week of treatment.  SEEK IMMEDIATE MEDICAL CARE IF:  You develop an increased fever or chills.   You have chest pain.   You have severe shortness of breath.  You have bloody sputum.   You develop dehydration.  You faint or repeatedly feel like you are going to pass out.  You develop repeated vomiting.  You develop a severe headache. MAKE SURE YOU:   Understand these instructions.  Will watch your condition.  Will get help right away if you are not doing well or get worse.   This information is not intended to replace advice given to you  by your health care provider. Make sure you discuss any questions you have with your health care provider.   Document Released: 09/29/2004 Document Revised: 09/12/2014 Document Reviewed: 02/12/2013 Elsevier Interactive Patient Education [employer].

## 2016-04-20 ENCOUNTER — Non-Acute Institutional Stay: Payer: Medicare Other | Admitting: Internal Medicine

## 2016-04-20 ENCOUNTER — Encounter: Payer: Self-pay | Admitting: Internal Medicine

## 2016-04-20 VITALS — BP 122/60 | HR 57 | Temp 98.9°F | Ht 73.0 in | Wt 213.0 lb

## 2016-04-20 DIAGNOSIS — J209 Acute bronchitis, unspecified: Secondary | ICD-10-CM | POA: Diagnosis not present

## 2016-04-20 DIAGNOSIS — I482 Chronic atrial fibrillation, unspecified: Secondary | ICD-10-CM

## 2016-04-20 DIAGNOSIS — I509 Heart failure, unspecified: Secondary | ICD-10-CM | POA: Diagnosis not present

## 2016-04-20 NOTE — Patient Instructions (Addendum)
Please avoid high sodium foods and drinks (like V8 juice).   Drink OJ instead at breakfast. Avoid drinking too much iced tea--the caffeine will increase your trips to the restroom at night. Drink water instead. Elevate your feet for a couple of hours during the day to help with the swelling.

## 2016-04-20 NOTE — Progress Notes (Signed)
Location:  [place] of Service:  Clinic (12)  Provider: Laquanta Hummel L. Mariea Clonts, D.O., C.M.D.  Code Status: DNR Goals of Care:  Advanced Directives 04/20/2016  Does patient have an advance directive? Yes  Type of Paramedic of [place];Out of facility DNR (pink MOST or yellow form)  Does patient want to make changes to advanced directive? -  Copy of advanced directive(s) in chart? Yes  Pre-existing out of facility DNR order (yellow form or pink MOST form) Yellow form placed in chart (order not valid for inpatient use)     Chief Complaint  Patient presents with  . Medical Management of Chronic Issues    follow-up chest xray    HPI: Patient is a 82 y.o. male seen today for an acute visit for f/u on chest xray.  Gary [name] had been seen at the office for an acute visit by Sherrie Mustache on 04/11/16 with cough and congestion for one week.  He'd had a sore throat, as well, and could not sleep due to cough and muscles were sore.  He was taking some tylenol.  He was exhausted, intake poor.  He was afebrile (but using tylenol).  He felt like he did when he had bronchitis before.  CXR was done that day showing cardiomegaly, basal pleural thickening consistent with scarring but could possibly be small effusions.  No infiltrates noted.  He was treated with mucinex DM and doxycycline 100mg  po bid for 7 days and to f/u with me this week.    Still has some cough.  Not being kept up at night.  Nonproductive.    C/o having to urinate multiple times through the night--takes lasix every other day after breakfast.  Does get up 4-5 times on average at night.  Does use bedside urinal.    BPs are good ranging 110-150/60s-70s.    Denies heart palpitations.    Cherylin Mylar has requested his son become more involved.    Past Medical History:  Diagnosis Date  . Abnormality of gait 02/2010  . Anal and rectal polyp 1980  . Atrial fibrillation ([place]) 11/2009  . Chronic kidney  disease, stage III (moderate) 08/07/2013  . Deviated nasal septum 11/2009  . Diaphragmatic hernia without mention of obstruction or gangrene 2007  . Diarrhea 02/2010  . Dizziness and giddiness 2009  . Edema 2010  . Essential and other specified forms of tremor 2007  . 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 Hyperglycemia 01/02/2013  . Hypertrophy of prostate without urinary obstruction and other lower urinary tract symptoms (LUTS) 2002  . Insomnia with sleep apnea, unspecified 02/2010  . Irritable bowel syndrome 11/14/2011  . Long term (current) use of anticoagulants 11/2009  . Other abnormal blood chemistry 02/09/2009  . Other B-complex deficiencies 2008  . Other dyspnea and respiratory abnormality 11/2009  . Other malaise and fatigue 2007  . Pain in joint, lower leg 02/2010  . Palpitations 2007  . Reflux esophagitis 2007  . Sebaceous cyst 2008  . Tension headache 2009  . Undiagnosed cardiac murmurs 2002  . Unspecified constipation 07/04/2012  . Unspecified essential hypertension 2007  . Unspecified hereditary and idiopathic peripheral neuropathy 02/2010  . Unspecified transient cerebral ischemia 2002  . Urinary frequency 10/01/2012    No past surgical history on file.  Allergies  Allergen Reactions  . Plavix [Clopidogrel Bisulfate]     indigestion      Medication List       Accurate as of  04/20/16  1:24 PM. Always use your most recent med list.          Acetaminophen 500 MG coapsule Take by mouth. Take 2 tablets once daily as needed   CENTRUM SILVER PO Take by mouth daily.   dextromethorphan-guaiFENesin 30-600 MG 12hr tablet Commonly known as:  MUCINEX DM Take 1 tablet by mouth 2 (two) times daily.   doxazosin 4 MG tablet Commonly known as:  CARDURA Take 4 mg by mouth. Take 1/2 tablets daily for prostate   furosemide 20 MG tablet Commonly known as:  LASIX Take 20 mg by mouth every other day. Monday, Wednesday, Friday   loperamide 2 MG  tablet Commonly known as:  IMODIUM A-D Take 2 mg by mouth. Take 1/2 one (1mg ) as needed to treat loose stools   metoprolol tartrate 25 MG tablet Commonly known as:  LOPRESSOR Take 1 tablet (25 mg total) by mouth daily.   polyethylene glycol packet Commonly known as:  MIRALAX / GLYCOLAX Take 17 g by mouth every other day.   ranitidine 75 MG tablet Commonly known as:  ZANTAC Take 75 mg by mouth daily.   vitamin B-12 1000 MCG tablet Commonly known as:  CYANOCOBALAMIN Take 1,000 mcg by mouth daily.       Review of Systems:  Review of Systems  Constitutional: Negative for chills, fever, malaise/fatigue and weight loss.  HENT: Positive for hearing loss. Negative for congestion and sore throat.   Eyes: Negative for blurred vision.       Glasses  Respiratory: Positive for cough. Negative for sputum production, shortness of breath and wheezing.   Cardiovascular: Positive for leg swelling. Negative for chest pain and palpitations.  Gastrointestinal: Negative for abdominal pain and constipation.  Genitourinary: Positive for frequency. Negative for dysuria.  Musculoskeletal: Negative for falls.  Neurological: Negative for dizziness, loss of consciousness and weakness.  Psychiatric/Behavioral: Negative for memory loss.    Health Maintenance  Topic Date Due  . ZOSTAVAX  [id number]  . TETANUS/TDAP  09/05/1988  . INFLUENZA VACCINE  04/05/2016  . PNA vac Low Risk Adult  Completed    Physical Exam: Vitals:   04/20/16 1317  BP: 122/60  Pulse: (!) 57  Temp: 98.9 F (37.2 C)  TempSrc: Oral  SpO2: 95%  Weight: 213 lb (96.6 kg)  Height: 6\' 1"  (1.854 m)   Body mass index is 28.1 kg/m. Physical Exam  Constitutional: He is oriented to person, place, and time. He appears well-developed and well-nourished. No distress.  Cardiovascular: Intact distal pulses.   irreg irreg  Pulmonary/Chest: Effort normal and breath sounds normal. No respiratory distress. He has no wheezes. He has no  rales. He exhibits no tenderness.  Musculoskeletal: Normal range of motion.  Walks with lightweight walker or power chair  Neurological: He is alert and oriented to person, place, and time.  Skin: Skin is warm and dry.  Psychiatric: He has a normal mood and affect.   Labs reviewed: Basic Metabolic Panel:  Recent Labs  04/22/15 03/01/16 0500  NA 143 142  K 3.7 4.1  BUN 30* 25*  CREATININE 1.4* 1.3   Liver Function Tests:  Recent Labs  04/22/15 03/01/16 0500  AST 12* 11*  ALT 12 10  ALKPHOS 57 69   No results for input(s): LIPASE, AMYLASE in the last 8760 hours. No results for input(s): AMMONIA in the last 8760 hours. CBC:  Recent Labs  04/22/15 03/01/16 0500  WBC 4.8 4.9  HGB 12.4* 13.5  HCT 36* 44  PLT 141* 128*   Lipid Panel:  Recent Labs  03/01/16 0500  CHOL 119  HDL 56  LDLCALC 52  TRIG 54   Lab Results  Component Value Date   HGBA1C 5.3 03/01/2016    Procedures since last visit: Dg Chest 2 View  Result Date: 04/12/2016 CLINICAL DATA:  Cough. EXAM: CHEST  2 VIEW COMPARISON:  04/26/ 2016 .  12/08/2010. FINDINGS: Mediastinum hilar structures are normal. Cardiomegaly with normal pulmonary vascularity. Basilar pleural thickening again noted consistent scarring. Tiny pleural effusions cannot be completely excluded. No pneumothorax. IMPRESSION: 1. Cardiomegaly.  No evidence of overt congestive heart failure. 2. Basal pleural thickening again noted most consistent scarring. Tiny pleural effusions cannot be excluded. Electronically Signed   By: [place]   On: 04/12/2016 09:00    Assessment/Plan 1. Acute bronchitis, unspecified organism -resolved after doxycycline therapy -has minor cough left, but otherwise is back to baseline  2. Chronic atrial fibrillation (HCC) -continues on metoprolol, has refused anticoagulation at 89  3. Chronic congestive heart failure, unspecified congestive heart failure type ([place]) -has some edema of bilateral ankles but  this is unchanged and he is not adherent with elevating his feet and avoiding some high sodium items--we reviewed that extensively today with his son present and put it in his AVS instructions  Labs/tests ordered:  No new Next appt:  07/13/2016  Treana Lacour L. Reinette Cuneo, D.O. [facility] [address], [place] [address] Cell Phone (Mon-Fri 8am-5pm):  [phone] On Call:  [phone] & follow prompts after 5pm & weekends Office Phone:  [phone] Office Fax:  [phone]

## 2016-07-13 ENCOUNTER — Encounter: Payer: Self-pay | Admitting: Internal Medicine

## 2016-07-13 ENCOUNTER — Non-Acute Institutional Stay: Payer: Medicare Other | Admitting: Internal Medicine

## 2016-07-13 VITALS — BP 148/70 | HR 58 | Temp 98.5°F | Wt 217.0 lb

## 2016-07-13 DIAGNOSIS — R195 Other fecal abnormalities: Secondary | ICD-10-CM | POA: Diagnosis not present

## 2016-07-13 DIAGNOSIS — N183 Chronic kidney disease, stage 3 unspecified: Secondary | ICD-10-CM

## 2016-07-13 DIAGNOSIS — N401 Enlarged prostate with lower urinary tract symptoms: Secondary | ICD-10-CM

## 2016-07-13 DIAGNOSIS — I509 Heart failure, unspecified: Secondary | ICD-10-CM | POA: Diagnosis not present

## 2016-07-13 DIAGNOSIS — I482 Chronic atrial fibrillation, unspecified: Secondary | ICD-10-CM

## 2016-07-13 DIAGNOSIS — R350 Frequency of micturition: Secondary | ICD-10-CM | POA: Diagnosis not present

## 2016-07-13 DIAGNOSIS — R739 Hyperglycemia, unspecified: Secondary | ICD-10-CM

## 2016-07-13 NOTE — Progress Notes (Signed)
Location:  [facility] of Service:  Clinic (12)  Provider: Cayleen Benjamin L. Mariea Clonts, D.O., C.M.D.  Code Status: DNR Goals of Care:  Advanced Directives 04/20/2016  Does patient have an advance directive? Yes  Type of Paramedic of [place];Out of facility DNR (pink MOST or yellow form)  Does patient want to make changes to advanced directive? -  Copy of advanced directive(s) in chart? Yes  Pre-existing out of facility DNR order (yellow form or pink MOST form) Yellow form placed in chart (order not valid for inpatient use)   Chief Complaint  Patient presents with  . Medical Management of Chronic Issues    4 mth follow-up    HPI: Patient is a 82 y.o. male seen today for medical management of chronic diseases.    Doing great.  Has no complaints except aches and pains.    Still has occasional episodes of palpitations at night.  Falls back to sleep.  Can last 15 mins.  He is pleased with how he is doing.  He does not want a cardiology evaluation or holter monitor to determine what's going on.  He also reports worsening dyspnea on exertion, but is generally not mobile and deconditioned.  He primarily   Past Medical History:  Diagnosis Date  . Abnormality of gait 02/2010  . Anal and rectal polyp 1980  . Atrial fibrillation ([place]) 11/2009  . Chronic kidney disease, stage III (moderate) 08/07/2013  . Deviated nasal septum 11/2009  . Diaphragmatic hernia without mention of obstruction or gangrene 2007  . Diarrhea 02/2010  . Dizziness and giddiness 2009  . Edema 2010  . Essential and other specified forms of tremor 2007  . 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 Hyperglycemia 01/02/2013  . Hypertrophy of prostate without urinary obstruction and other lower urinary tract symptoms (LUTS) 2002  . Insomnia with sleep apnea, unspecified 02/2010  . Irritable bowel syndrome 11/14/2011  . Long term (current) use of anticoagulants  11/2009  . Other abnormal blood chemistry 02/09/2009  . Other B-complex deficiencies 2008  . Other dyspnea and respiratory abnormality 11/2009  . Other malaise and fatigue 2007  . Pain in joint, lower leg 02/2010  . Palpitations 2007  . Reflux esophagitis 2007  . Sebaceous cyst 2008  . Tension headache 2009  . Undiagnosed cardiac murmurs 2002  . Unspecified constipation 07/04/2012  . Unspecified essential hypertension 2007  . Unspecified hereditary and idiopathic peripheral neuropathy 02/2010  . Unspecified transient cerebral ischemia 2002  . Urinary frequency 10/01/2012    No past surgical history on file.  Allergies  Allergen Reactions  . Plavix [Clopidogrel Bisulfate]     indigestion      Medication List       Accurate as of 07/13/16 11:35 AM. Always use your most recent med list.          Acetaminophen 500 MG coapsule Take by mouth. Take 2 tablets once daily as needed   CENTRUM SILVER PO Take by mouth daily.   doxazosin 4 MG tablet Commonly known as:  CARDURA Take 4 mg by mouth. Take 1/2 tablets daily for prostate   furosemide 20 MG tablet Commonly known as:  LASIX Take 20 mg by mouth every other day. Monday, Wednesday, Friday   loperamide 2 MG tablet Commonly known as:  IMODIUM A-D Take 2 mg by mouth. Take 1/2 one (1mg ) as needed to treat loose stools   metoprolol tartrate 25 MG tablet Commonly  known as:  LOPRESSOR Take 1 tablet (25 mg total) by mouth daily.   polyethylene glycol packet Commonly known as:  MIRALAX / GLYCOLAX Take 17 g by mouth every other day.   ranitidine 75 MG tablet Commonly known as:  ZANTAC Take 75 mg by mouth daily.   vitamin B-12 1000 MCG tablet Commonly known as:  CYANOCOBALAMIN Take 1,000 mcg by mouth daily.       Review of Systems:  Review of Systems  Constitutional: Negative for chills and fever.  HENT: Positive for hearing loss.        Hearing aides  Eyes:       Glasses  Respiratory: Positive for shortness of  breath. Negative for cough.        Deconditioned also  Cardiovascular: Positive for palpitations and leg swelling. Negative for chest pain, orthopnea, claudication and PND.  Gastrointestinal: Positive for constipation and diarrhea. Negative for abdominal pain, blood in stool, heartburn, melena, nausea and vomiting.       About 1x per month, has fecal incontinence which seems to depend on what he eats, but cannot make a correlation of specific foods  Genitourinary: Positive for frequency. Negative for dysuria, flank pain, hematuria and urgency.  Musculoskeletal: Negative for back pain, falls, joint pain and myalgias.       Uses scooter to get around except in small spaces and during transfers when uses a small walker  Skin: Negative for itching and rash.  Neurological: Negative for dizziness and loss of consciousness.  Endo/Heme/Allergies: Bruises/bleeds easily.  Psychiatric/Behavioral: Positive for memory loss. Negative for depression. The patient is not nervous/anxious and does not have insomnia.        Very mild memory loss/cognitive impairment only    Health Maintenance  Topic Date Due  . ZOSTAVAX  11/22/1977  . TETANUS/TDAP  09/05/1988  . INFLUENZA VACCINE  04/05/2016  . PNA vac Low Risk Adult  Completed    Physical Exam: Vitals:   07/13/16 1126  BP: (!) 148/70  Pulse: (!) 58  Temp: 98.5 F (36.9 C)  TempSrc: Oral  SpO2: 96%  Weight: 217 lb (98.4 kg)   Body mass index is 28.63 kg/m. Physical Exam  Constitutional: He is oriented to person, place, and time. He appears well-developed and well-nourished. No distress.  HENT:  Hearing aides  Eyes:  glasses  Cardiovascular: Intact distal pulses.   irreg irreg  Pulmonary/Chest: Effort normal and breath sounds normal. He has no rales.  Abdominal: Soft. Bowel sounds are normal. He exhibits no distension. There is no tenderness.  Musculoskeletal: Normal range of motion.  Neurological: He is alert and oriented to person, place,  and time.  Skin: Skin is warm and dry.  Psychiatric: He has a normal mood and affect.    Labs reviewed: Basic Metabolic Panel:  Recent Labs  03/01/16 0500  NA 142  K 4.1  BUN 25*  CREATININE 1.3   Liver Function Tests:  Recent Labs  03/01/16 0500  AST 11*  ALT 10  ALKPHOS 69   No results for input(s): LIPASE, AMYLASE in the last 8760 hours. No results for input(s): AMMONIA in the last 8760 hours. CBC:  Recent Labs  03/01/16 0500  WBC 4.9  HGB 13.5  HCT 44  PLT 128*   Lipid Panel:  Recent Labs  03/01/16 0500  CHOL 119  HDL 56  LDLCALC 52  TRIG 54   Lab Results  Component Value Date   HGBA1C 5.3 03/01/2016     Assessment/Plan  1. Chronic atrial fibrillation (HCC) -continues on lopressor 25mg  po daily  -reports he still gets palpitations at night where his pulse is elevated -he does not take the extra metoprolol as directed (he does not seem to remember this well) -he also does not want this worked up, to see cardiology or to have any intervention for it -his son agrees with whatever he wants b/c he is still fully able to make his own decisions  2. Chronic congestive heart failure, unspecified congestive heart failure type ([place]) -he is on lasix 20mg  every other day -he c/o the worsening of his frequency from the med, but when he has been off of it before, he's retained more fluid and developed pulmonary edema long with his edema -he refuses to take it daily or at higher doses despite his edema -he does wear compression socks  3. Chronic kidney disease, stage III (moderate) -renal function has been stable, encouraged hydration which he is not so good at with his BPH and diuretic use already making him go frequently  4. Loose stools -uses imodium when they strike with certain foods, but he cannot anticipate it and it's only about once a month, not wanting any workup of this either at 89  5. Hyperglycemia -has remained stable in the prediabetic  range, likes to eat and has a great appetite, does not exercise--uses scooter  6. Benign prostatic hyperplasia with urinary frequency -ongoing frequency, uses flomax, no dizziness  Labs/tests ordered:  May check after next visit Next appt:  12/14/2016 AWV/med mgt  Anita Mcadory L. Lively Haberman, D.O. [facility] [address] [place], [place] [address] Cell Phone (Mon-Fri 8am-5pm):  [phone] On Call:  [phone] & follow prompts after 5pm & weekends Office Phone:  [phone] Office Fax:  [phone]

## 2016-09-22 IMAGING — CR DG CHEST 2V
2 series · 2 of 2 positions shown · non-contrast
Comparison: 12/08/2010

CLINICAL DATA: Semi productive cough and chest congestion.

EXAM:
CHEST  2 VIEW

[w chest lat (1 of 2)]
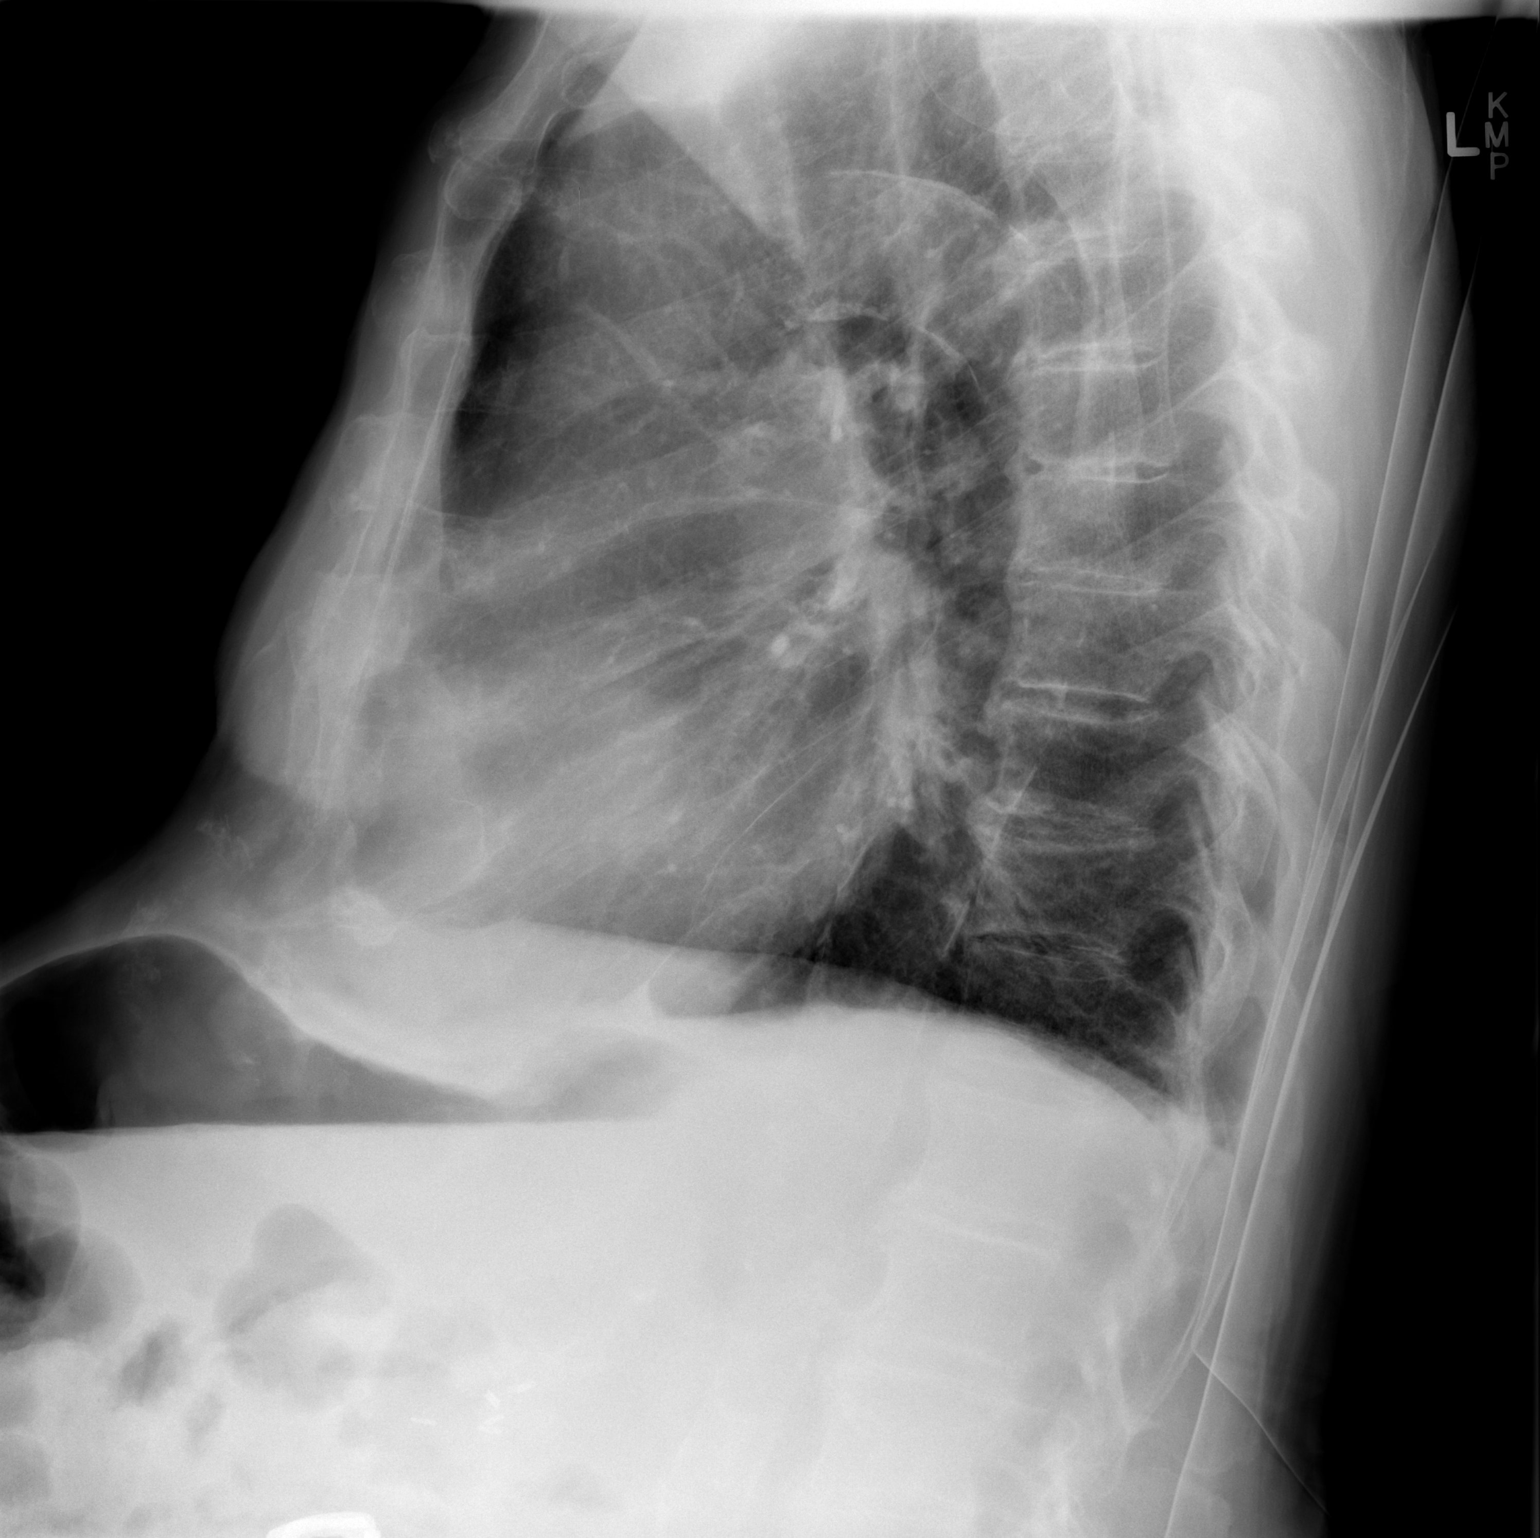

[w chest lat (2 of 2)]
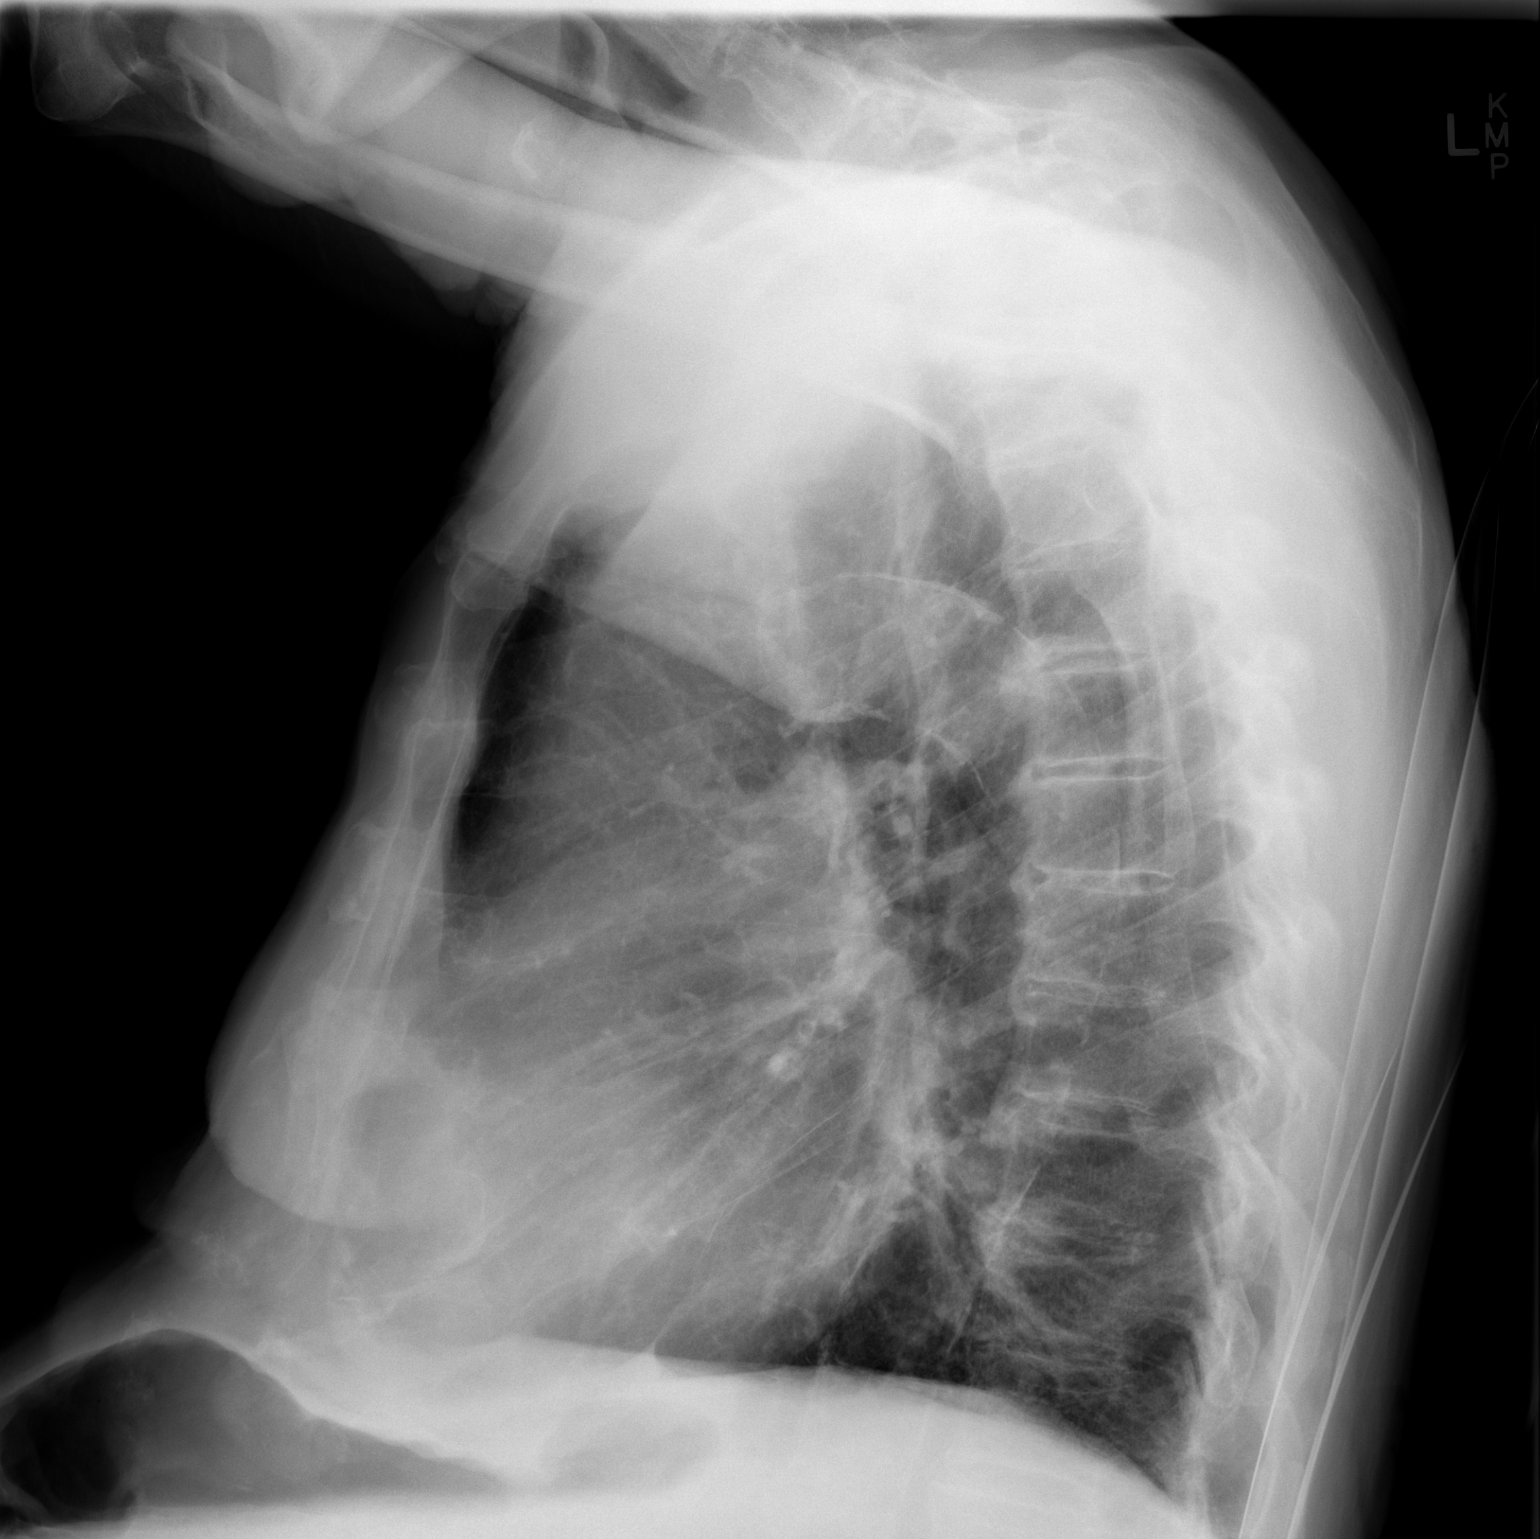

[2 of 2 positions shown; findings below may reference images not displayed]

FINDINGS: Chronic cardiomegaly. Prominent convexity of the right heart margin
on today's study is likely accentuated by patient positioning.
Aortic and hilar contours are stable.

There is hyperinflation without pneumonia or edema. No effusion or
pneumothorax.
IMPRESSION: Negative for pneumonia.

## 2016-10-23 ENCOUNTER — Encounter (HOSPITAL_COMMUNITY): Payer: Self-pay | Admitting: Emergency Medicine

## 2016-10-23 ENCOUNTER — Emergency Department (HOSPITAL_COMMUNITY)
Admission: EM | Admit: 2016-10-23 | Discharge: 2016-10-23 | Disposition: A | Discharge status: Home / Self Care | Payer: Medicare Other | Attending: Emergency Medicine | Admitting: Emergency Medicine

## 2016-10-23 ENCOUNTER — Emergency Department (HOSPITAL_COMMUNITY): Payer: Medicare Other

## 2016-10-23 DIAGNOSIS — I129 Hypertensive chronic kidney disease with stage 1 through stage 4 chronic kidney disease, or unspecified chronic kidney disease: Secondary | ICD-10-CM | POA: Insufficient documentation

## 2016-10-23 DIAGNOSIS — R531 Weakness: Secondary | ICD-10-CM | POA: Diagnosis not present

## 2016-10-23 DIAGNOSIS — Z79899 Other long term (current) drug therapy: Secondary | ICD-10-CM | POA: Insufficient documentation

## 2016-10-23 DIAGNOSIS — R0689 Other abnormalities of breathing: Secondary | ICD-10-CM | POA: Insufficient documentation

## 2016-10-23 DIAGNOSIS — N183 Chronic kidney disease, stage 3 (moderate): Secondary | ICD-10-CM | POA: Diagnosis not present

## 2016-10-23 DIAGNOSIS — R600 Localized edema: Secondary | ICD-10-CM | POA: Diagnosis not present

## 2016-10-23 DIAGNOSIS — M7989 Other specified soft tissue disorders: Secondary | ICD-10-CM | POA: Diagnosis present

## 2016-10-23 DIAGNOSIS — R609 Edema, unspecified: Secondary | ICD-10-CM

## 2016-10-23 DIAGNOSIS — R0602 Shortness of breath: Secondary | ICD-10-CM | POA: Diagnosis not present

## 2016-10-23 LAB — CBC
HEMATOCRIT: 39.2 % (ref 39.0–52.0)
Hemoglobin: 12.8 g/dL — ABNORMAL LOW (ref 13.0–17.0)
MCH: 31.1 pg (ref 26.0–34.0)
MCHC: 32.7 g/dL (ref 30.0–36.0)
MCV: 95.1 fL (ref 78.0–100.0)
PLATELETS: 120 10*3/uL — AB (ref 150–400)
RBC: 4.12 MIL/uL — AB (ref 4.22–5.81)
RDW: 13.3 % (ref 11.5–15.5)
WBC: 5.1 10*3/uL (ref 4.0–10.5)

## 2016-10-23 LAB — D-DIMER, QUANTITATIVE: D-Dimer, Quant: 0.45 ug/mL-FEU (ref 0.00–0.50)

## 2016-10-23 LAB — BASIC METABOLIC PANEL
ANION GAP: 8 (ref 5–15)
BUN: 22 mg/dL — ABNORMAL HIGH (ref 6–20)
CALCIUM: 8.7 mg/dL — AB (ref 8.9–10.3)
CO2: 26 mmol/L (ref 22–32)
Chloride: 106 mmol/L (ref 101–111)
Creatinine, Ser: 1.49 mg/dL — ABNORMAL HIGH (ref 0.61–1.24)
GFR, EST AFRICAN AMERICAN: 43 mL/min — AB (ref >60)
GFR, EST NON AFRICAN AMERICAN: 37 mL/min — AB (ref >60)
GLUCOSE: 124 mg/dL — AB (ref 65–99)
POTASSIUM: 3.8 mmol/L (ref 3.5–5.1)
Sodium: 140 mmol/L (ref 135–145)

## 2016-10-23 LAB — BRAIN NATRIURETIC PEPTIDE: B NATRIURETIC PEPTIDE 5: 570.2 pg/mL — AB (ref 0.0–100.0)

## 2016-10-23 LAB — PROTIME-INR
INR: 1.32
PROTHROMBIN TIME: 16.5 s — AB (ref 11.4–15.2)

## 2016-10-23 LAB — MAGNESIUM: Magnesium: 2.1 mg/dL (ref 1.7–2.4)

## 2016-10-23 MED ORDER — FUROSEMIDE 20 MG PO TABS: 30.0000 mg | ORAL_TABLET | Freq: Every day | ORAL | 0 refills | Status: DC

## 2016-10-23 MED ORDER — FUROSEMIDE 20 MG PO TABS
20.0000 mg | ORAL_TABLET | Freq: Once | ORAL | Status: AC
  Administered 2016-10-23: 20 mg via ORAL
  Filled 2016-10-23: qty 1

## 2016-10-23 NOTE — Discharge Instructions (Signed)
Increase your lasix to 30 mg per day for the next 3 days, then return to your normal dosing.  Follow up with your doctor next week to be rechecked.  Continue to exercise and keep your legs elevated when resting

## 2016-10-23 NOTE — ED Provider Notes (Signed)
[facility] DEPT Provider Note   CSN: [id number] Arrival date & time: 10/23/16  1516     History   Chief Complaint Chief Complaint  Patient presents with  . Leg Swelling    HPI Gary [name] is a 83 y.o. male.  HPI Pt states he noticed he was having trouble with his breathing.  He felt like he was not getting enough oxygen in.  Those symptoms progressed and he called for help last evening at [facility].  He had his vitals checked.  They gave him a dose of a diuretic.  He noticed increased urination after that but not much effect on his breathing initially but now it is better.   He has also noticed increased leg swelling over the last few days.  No fevers.  He has noticed some mild discomfort in his chest at times but it was not enough to mention.  Some decrease in appetite.  He also has had some increased weakness the last couple of days just getting out of a chair and walking to the bathroom.  Past Medical History:  Diagnosis Date  . Abnormality of gait 02/2010  . Anal and rectal polyp 1980  . Atrial fibrillation ([place]) 11/2009  . Chronic kidney disease, stage III (moderate) 08/07/2013  . Deviated nasal septum 11/2009  . Diaphragmatic hernia without mention of obstruction or gangrene 2007  . Diarrhea 02/2010  . Dizziness and giddiness 2009  . Edema 2010  . Essential and other specified forms of tremor 2007  . 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 Hyperglycemia 01/02/2013  . Hypertrophy of prostate without urinary obstruction and other lower urinary tract symptoms (LUTS) 2002  . Insomnia with sleep apnea, unspecified 02/2010  . Irritable bowel syndrome 11/14/2011  . Long term (current) use of anticoagulants 11/2009  . Other abnormal blood chemistry 02/09/2009  . Other B-complex deficiencies 2008  . Other dyspnea and respiratory abnormality 11/2009  . Other malaise and fatigue 2007  . Pain in joint, lower leg 02/2010  . Palpitations 2007  . Reflux  esophagitis 2007  . Sebaceous cyst 2008  . Tension headache 2009  . Undiagnosed cardiac murmurs 2002  . Unspecified constipation 07/04/2012  . Unspecified essential hypertension 2007  . Unspecified hereditary and idiopathic peripheral neuropathy 02/2010  . Unspecified transient cerebral ischemia 2002  . Urinary frequency 10/01/2012    Patient Active Problem List   Diagnosis Date Noted  . Hemoptysis 06/03/2015  . Rectal bleeding 06/03/2015  . Loose stools 06/03/2015  . Congestive heart disease ([place]) 06/03/2015  . Edema 06/03/2015  . Constipation 12/25/2014  . BPH (benign prostatic hyperplasia) 03/12/2014  . Other malaise and fatigue 12/04/2013  . Healthcare maintenance 12/04/2013  . Chronic kidney disease, stage III (moderate) 08/07/2013  . Advanced directives, counseling/discussion 04/03/2013  . Atrial fibrillation ([place]) 01/02/2013  . Essential hypertension, benign 01/02/2013  . Hyperglycemia 01/02/2013    History reviewed. No pertinent surgical history.     Home Medications    Prior to Admission medications   Medication Sig Start Date End Date Taking? Authorizing Provider  Acetaminophen 500 MG coapsule Take by mouth. Take 2 tablets once daily as needed   Yes Historical Provider, MD  doxazosin (CARDURA) 4 MG tablet Take 2 mg by mouth.    Yes Historical Provider, MD  furosemide (LASIX) 20 MG tablet Take 20 mg by mouth every other day. Monday, Wednesday, Friday   Yes Historical Provider, MD  loperamide (IMODIUM A-D) 2 MG tablet Take 2  mg by mouth as needed for diarrhea or loose stools.    Yes Historical Provider, MD  metoprolol succinate (TOPROL-XL) 50 MG 24 hr tablet Take 50 mg by mouth daily. Take with or immediately following a meal.   Yes Historical Provider, MD  metoprolol tartrate (LOPRESSOR) 25 MG tablet Take 12.5 mg by mouth daily as needed. (no prn reason given on paperwork)   Yes Historical Provider, MD  Multiple Vitamins-Minerals (CENTRUM SILVER PO) Take 1 tablet by  mouth daily.    Yes Historical Provider, MD  polyethylene glycol (MIRALAX / GLYCOLAX) packet Take 17 g by mouth daily as needed for mild constipation.    Yes Historical Provider, MD  ranitidine (ZANTAC) 75 MG tablet Take 75 mg by mouth daily.    Yes Historical Provider, MD  vitamin B-12 (CYANOCOBALAMIN) 1000 MCG tablet Take 1,000 mcg by mouth daily.   Yes Historical Provider, MD  metoprolol tartrate (LOPRESSOR) 25 MG tablet Take 1 tablet (25 mg total) by mouth daily. Patient not taking: Reported on 10/23/2016 03/30/16   Gayland Curry, DO    Family History Family History  Problem Relation Age of Onset  . Congestive Heart Failure Mother   . Diabetes Father   . Heart disease Father   . Cancer Sister     lung   . Kidney disease Sister     Social History Social History  Substance Use Topics  . Smoking status: Never Smoker  . Smokeless tobacco: Never Used  . Alcohol use Yes     Comment: 2oz a week     Allergies   Plavix [clopidogrel bisulfate]   Review of Systems Review of Systems  All other systems reviewed and are negative.    Physical Exam Updated Vital Signs BP 167/71 (BP Location: Right Arm)   Pulse (!) 58   Temp 98.1 F (36.7 C) (Oral)   Resp 20   Ht 5' 11.5" (1.816 m)   Wt 96.2 kg   SpO2 95%   BMI 29.16 kg/m   Physical Exam  Constitutional: No distress.  Elderly   HENT:  Head: Normocephalic and atraumatic.  Right Ear: External ear normal.  Left Ear: External ear normal.  Eyes: Conjunctivae are normal. Right eye exhibits no discharge. Left eye exhibits no discharge. No scleral icterus.  Neck: Neck supple. No tracheal deviation present.  Cardiovascular: Normal rate, regular rhythm and intact distal pulses.   Pulmonary/Chest: Effort normal and breath sounds normal. No stridor. No respiratory distress. He has no wheezes. He has no rales.  Abdominal: Soft. Bowel sounds are normal. He exhibits no distension. There is no tenderness. There is no rebound and no  guarding.  Musculoskeletal: He exhibits edema. He exhibits no tenderness.  Edema bilateral lower extremities  Below the knees, mild erythema suggestive of chronic venous stasis, no increased warmth,   Neurological: He is alert. He has normal strength. No cranial nerve deficit (no facial droop, extraocular movements intact, no slurred speech) or sensory deficit. He exhibits normal muscle tone. He displays no seizure activity. Coordination normal.  Skin: Skin is warm and dry. No rash noted. He is not diaphoretic.  Psychiatric: He has a normal mood and affect.  Nursing note and vitals reviewed.    ED Treatments / Results  Labs (all labs ordered are listed, but only abnormal results are displayed) Labs Reviewed  CBC - Abnormal; Notable for the following:       Result Value   RBC 4.12 (*)    Hemoglobin 12.8 (*)  Platelets 120 (*)    All other components within normal limits  BASIC METABOLIC PANEL - Abnormal; Notable for the following:    Glucose, Bld 124 (*)    BUN 22 (*)    Creatinine, Ser 1.49 (*)    Calcium 8.7 (*)    GFR calc non Af Amer 37 (*)    GFR calc Af Amer 43 (*)    All other components within normal limits  BRAIN NATRIURETIC PEPTIDE - Abnormal; Notable for the following:    B Natriuretic Peptide 570.2 (*)    All other components within normal limits  PROTIME-INR - Abnormal; Notable for the following:    Prothrombin Time 16.5 (*)    All other components within normal limits  MAGNESIUM  D-DIMER, QUANTITATIVE (NOT AT [facility])    EKG  EKG Interpretation  Date/Time:  Sunday October 23 2016 16:25:16 EST Ventricular Rate:  67 PR Interval:    QRS Duration: 111 QT Interval:  463 QTC Calculation: 423 R Axis:   75 Text Interpretation:  atrial fibrillation with slow ventricular response Multiform ventricular premature complexes Borderline repolarization abnormality Confirmed by Azura Tufaro  MD-J, Miley Lindon ([id number]) on 10/23/2016 4:38:47 PM       Radiology Dg Chest 2 View  Result  Date: 10/23/2016 CLINICAL DATA:  Shortness of breath. EXAM: CHEST  2 VIEW COMPARISON:  04/11/2016 FINDINGS: AP and lateral views of the chest. The cardio pericardial silhouette is enlarged. Interstitial markings are diffusely coarsened with chronic features. Basilar atelectasis bilaterally with tiny left pleural effusion. Bones are diffusely demineralized. IMPRESSION: Cardiomegaly with underlying chronic interstitial changes. Basilar atelectasis with tiny left pleural effusion. Electronically Signed   By: Misty Stanley M.D.   On: 10/23/2016 17:02    Procedures Procedures (including critical care time)  Medications Ordered in ED Medications  furosemide (LASIX) tablet 20 mg (not administered)     Initial Impression / Assessment and Plan / ED Course  I have reviewed the triage vital signs and the nursing notes.  Pertinent labs & imaging results that were available during my care of the patient were reviewed by me and considered in my medical decision making (see chart for details).   the patient's laboratory tests are reassuring. He does not have pulmonary edema on x-ray. No pneumonia noted on x-ray. D-dimer is negative arguing against PE, DVT.  I suspect his sx are related to CHF.   Sx are fortunately mild at this point.  He is not having any breathing difficulty.  Will try increasing his diuretic for the next few days.  Try to keep his legs elevated.  Continue to try and exercise. Follow up with his primary care doctor next week.    Final Clinical Impressions(s) / ED Diagnoses   Final diagnoses:  Peripheral edema    New Prescriptions New Prescriptions   No medications on file     Dorie Rank, MD 10/23/16 1807

## 2016-10-23 NOTE — ED Triage Notes (Signed)
Pt BIB EMS from [facility]; per EMS nurse noticed patient had increased swelling and redness to bilateral lower extremities. Pt c/o SOB when laying flat and states "I just feel weaker than normal." Pt denies pain or injury; resp e/u; nad noted at this time.

## 2016-10-23 NOTE — ED Notes (Signed)
Patient transported to X-ray 

## 2016-10-26 ENCOUNTER — Non-Acute Institutional Stay: Payer: Medicare Other | Admitting: Internal Medicine

## 2016-10-26 ENCOUNTER — Encounter: Payer: Self-pay | Admitting: Internal Medicine

## 2016-10-26 VITALS — BP 148/80 | HR 59 | Temp 98.3°F | Wt 214.0 lb

## 2016-10-26 DIAGNOSIS — I482 Chronic atrial fibrillation, unspecified: Secondary | ICD-10-CM

## 2016-10-26 DIAGNOSIS — N183 Chronic kidney disease, stage 3 unspecified: Secondary | ICD-10-CM

## 2016-10-26 DIAGNOSIS — I509 Heart failure, unspecified: Secondary | ICD-10-CM

## 2016-10-26 DIAGNOSIS — R531 Weakness: Secondary | ICD-10-CM

## 2016-10-26 MED ORDER — FUROSEMIDE 20 MG PO TABS: ORAL_TABLET | ORAL | 3 refills | Status: DC

## 2016-10-26 NOTE — Patient Instructions (Addendum)
1.  Increase lasix to 30mg  every other day.  First dose Friday.   2.  Elevate your feet at rest. 3.  Avoid high sodium foods. 4.  Go to the fitness center to use the exercise bike to strengthen your quadriceps as an alternative to physical therapy. 5.  Continue to see Mliss Sax or Kim for blood pressure monitoring. 6.  Let me know if you continue to be more short of breath, weak or if your legs become more swollen again. 7.  I will see you 11/09/16 at 3:30pm. 8.  We will plan to recheck your chest xray to make sure the fluid at the base of your left lung has gone away.

## 2016-10-26 NOTE — Progress Notes (Signed)
Location:  Occupational psychologist of Service:  Clinic (12)  Provider: Radhika Dershem L. Mariea Clonts, D.O., C.M.D.  Code Status: DNR Goals of Care:  Advanced Directives 10/23/2016  Does Patient Have a Medical Advance Directive? Yes  Type of Paramedic of [place];Living will;Out of facility DNR (pink MOST or yellow form)  Does patient want to make changes to medical advance directive? -  Copy of [place] in Chart? -  Pre-existing out of facility DNR order (yellow form or pink MOST form) -   Chief Complaint  Patient presents with  . Follow-up    ER follow-up    HPI: Patient is a 83 y.o. [name] seen today for ER follow up for increased swelling of his legs on 10/23/16.  His primary complaint per notes was shortness of breath that had worsened and he felt like he could not get oxygen in.  He was given a diuretic here before he was sent to the ED.  He has had some decrease in appetite and increased weakness for a couple days before the ED visit (when getting out of his chair and going to the bathroom).  Weight was not really up upon review of trends.  No pneumonia or pulmonary edema was seen on CXR (showed cardiomegaly with underlying chronic interstitial changes and basilar atelectasis with tiny left pleural effusion), D dimer was negative at 0.45.  BNP was 570.2.  His dyspnea improved after his diuretic was increased per ED notes.  His anemia had returned with hgb 12.8 down from 13.5 in June of last year.  Kidneys were stable. He was advised to elevate his legs and f/u with me.  The lasix change was 30mg  daily for three days after ED visit, then 20mg  daily on Monday, Wednesday, Friday.  He was very shaky coming in and weighing today after he came off of his scooter.    Past Medical History:  Diagnosis Date  . Abnormality of gait 02/2010  . Anal and rectal polyp 1980  . Atrial fibrillation ([place]) 11/2009  . Chronic kidney disease, stage III  (moderate) 08/07/2013  . Deviated nasal septum 11/2009  . Diaphragmatic hernia without mention of obstruction or gangrene 2007  . Diarrhea 02/2010  . Dizziness and giddiness 2009  . Edema 2010  . Essential and other specified forms of tremor 2007  . 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 Hyperglycemia 01/02/2013  . Hypertrophy of prostate without urinary obstruction and other lower urinary tract symptoms (LUTS) 2002  . Insomnia with sleep apnea, unspecified 02/2010  . Irritable bowel syndrome 11/14/2011  . Long term (current) use of anticoagulants 11/2009  . Other abnormal blood chemistry 02/09/2009  . Other B-complex deficiencies 2008  . Other dyspnea and respiratory abnormality 11/2009  . Other malaise and fatigue 2007  . Pain in joint, lower leg 02/2010  . Palpitations 2007  . Reflux esophagitis 2007  . Sebaceous cyst 2008  . Tension headache 2009  . Undiagnosed cardiac murmurs 2002  . Unspecified constipation 07/04/2012  . Unspecified essential hypertension 2007  . Unspecified hereditary and idiopathic peripheral neuropathy 02/2010  . Unspecified transient cerebral ischemia 2002  . Urinary frequency 10/01/2012    No past surgical history on file.  Allergies  Allergen Reactions  . Plavix [Clopidogrel Bisulfate]     indigestion    Allergies as of 10/26/2016      Reactions   Plavix [clopidogrel Bisulfate]    indigestion  Medication List       Accurate as of 10/26/16 11:37 AM. Always use your most recent med list.          Acetaminophen 500 MG coapsule Take by mouth. Take 2 tablets once daily as needed   CENTRUM SILVER PO Take 1 tablet by mouth daily.   doxazosin 4 MG tablet Commonly known as:  CARDURA Take 2 mg by mouth.   furosemide 20 MG tablet Commonly known as:  LASIX Take 1.5 tablets (30 mg total) by mouth daily. For the next three days and the return to your 20 mg dose on Monday, Wednesday and Friday   loperamide 2 MG  tablet Commonly known as:  IMODIUM A-D Take 2 mg by mouth as needed for diarrhea or loose stools.   metoprolol succinate 50 MG 24 hr tablet Commonly known as:  TOPROL-XL Take 50 mg by mouth daily. Take with or immediately following a meal.   metoprolol tartrate 25 MG tablet Commonly known as:  LOPRESSOR Take 12.5 mg by mouth daily as needed. (no prn reason given on paperwork)   metoprolol tartrate 25 MG tablet Commonly known as:  LOPRESSOR Take 1 tablet (25 mg total) by mouth daily.   polyethylene glycol packet Commonly known as:  MIRALAX / GLYCOLAX Take 17 g by mouth daily as needed for mild constipation.   ranitidine 75 MG tablet Commonly known as:  ZANTAC Take 75 mg by mouth daily.   vitamin B-12 1000 MCG tablet Commonly known as:  CYANOCOBALAMIN Take 1,000 mcg by mouth daily.       Review of Systems:  Review of Systems  Constitutional: Positive for malaise/fatigue. Negative for chills, fever and weight loss.  HENT: Positive for hearing loss. Negative for congestion.   Eyes: Negative for blurred vision.       Glasses  Respiratory: Positive for shortness of breath. Negative for cough, hemoptysis, sputum production and wheezing.   Cardiovascular: Positive for leg swelling. Negative for chest pain, palpitations, orthopnea, claudication and PND.       Edema is much improved per his son and himself  Gastrointestinal: Negative for abdominal pain, blood in stool, constipation, diarrhea and melena.       Still has occasional problem with loose incontinent stool depending on what he eats  Genitourinary: Negative for dysuria.  Musculoskeletal: Negative for falls.  Skin: Negative for itching and rash.  Neurological: Positive for tremors and weakness. Negative for dizziness, tingling, sensory change, speech change, focal weakness, loss of consciousness and headaches.       Very unsteady today getting off of scooter to be weighed  Psychiatric/Behavioral: Positive for memory loss.  Negative for depression.    Health Maintenance  Topic Date Due  . TETANUS/TDAP  09/05/1988  . INFLUENZA VACCINE  Completed  . PNA vac Low Risk Adult  Completed    Physical Exam: Vitals:   10/26/16 1118  BP: (!) 148/80  Pulse: (!) 59  Temp: 98.3 F (36.8 C)  TempSrc: Oral  SpO2: 96%  Weight: 214 lb (97.1 kg)   Body mass index is 29.43 kg/m. Physical Exam  Constitutional: He is oriented to person, place, and time. He appears well-developed and well-nourished. No distress.  HENT:  Head: Normocephalic and atraumatic.  Eyes:  glasses  Cardiovascular: Intact distal pulses.   Irregular irregular; 2+ edema of legs present (did not see at ED, but better per son)  Pulmonary/Chest: Effort normal. He has no wheezes. He has rales.  Left base crackles  Abdominal:  Soft. Bowel sounds are normal.  Musculoskeletal: Normal range of motion.  Weak and unsteady during transfer from power scooter  Neurological: He is alert and oriented to person, place, and time. No sensory deficit. He exhibits normal muscle tone.  Some short term memory loss during appt, but mostly quite sharp    Labs reviewed: Basic Metabolic Panel:  Recent Labs  03/01/16 0500 10/23/16 1627  NA 142 140  K 4.1 3.8  CL  --  106  CO2  --  26  GLUCOSE  --  124*  BUN 25* 22*  CREATININE 1.3 1.49*  CALCIUM  --  8.7*  MG  --  2.1   Liver Function Tests:  Recent Labs  03/01/16 0500  AST 11*  ALT 10  ALKPHOS 69   No results for input(s): LIPASE, AMYLASE in the last 8760 hours. No results for input(s): AMMONIA in the last 8760 hours. CBC:  Recent Labs  03/01/16 0500 10/23/16 1627  WBC 4.9 5.1  HGB 13.5 12.8*  HCT 44 39.2  MCV  --  95.1  PLT 128* 120*   Lipid Panel:  Recent Labs  03/01/16 0500  CHOL 119  HDL 56  LDLCALC 52  TRIG 54   Lab Results  Component Value Date   HGBA1C 5.3 03/01/2016    Procedures since last visit: Dg Chest 2 View  Result Date: 10/23/2016 CLINICAL DATA:   Shortness of breath. EXAM: CHEST  2 VIEW COMPARISON:  04/11/2016 FINDINGS: AP and lateral views of the chest. The cardio pericardial silhouette is enlarged. Interstitial markings are diffusely coarsened with chronic features. Basilar atelectasis bilaterally with tiny left pleural effusion. Bones are diffusely demineralized. IMPRESSION: Cardiomegaly with underlying chronic interstitial changes. Basilar atelectasis with tiny left pleural effusion. Electronically Signed   By: Misty Stanley M.D.   On: 10/23/2016 17:02    Assessment/Plan 1. Chronic congestive heart failure, unspecified congestive heart failure type ([place]) - he had been told to take 30mg  of lasix daily for three days when he was at the ED and he says he's completed this - advised now to increase his regular lasix which was 20mg  Mon/Wed/Fri to 30mg  M/W/F - to see clinic nurse for bps and wts (already goes for bp) and if weight trends back up, he may need daily lasix--he once took daily but was becoming dizzy and weak and his urinary frequency was unbearable for him - furosemide (LASIX) 20 MG tablet; Take 1.5 tablets by mouth on Monday, Wednesday and Friday ongoing  Dispense: 45 tablet; Refill: 3  2. Chronic atrial fibrillation (HCC) - cont toprol xl 50mg  po daily - he's had difficulty with dizziness at higher doses - furosemide (LASIX) 20 MG tablet; Take 1.5 tablets by mouth on Monday, Wednesday and Friday ongoing  Dispense: 45 tablet; Refill: 3  3. Chronic kidney disease, stage III (moderate) -also contributory to volume overload, cont to monitor  4. Generalized weakness -related to episode of CHF exacerbation and edeam -see above -recommended PT, but he refused--explained in detail importance of this to keep him independent, but he still refused -also advised to get help in the mornings while showering (son thought current caregivers were there while he showered just in case, but turns out pt is showering at other times not when they  are there to clean and do laundry) - pt also refused this -advised his son that he may need to step in to encourage the Cherylin Mylar to get more help when he needs it  Labs/tests ordered:  No new Next appt:  12/14/2016  Instructions provided to pt and son:   1.  Increase lasix to 30mg  every other day.  First dose Friday.   2.  Elevate your feet at rest. 3.  Avoid high sodium foods. 4.  Go to the fitness center to use the exercise bike to strengthen your quadriceps as an alternative to physical therapy. 5.  Continue to see Mliss Sax or Kim for blood pressure monitoring. 6.  Let me know if you continue to be more short of breath, weak or if your legs become more swollen again. 7.  I will see you 11/09/16 at 3:30pm. 8.  We will plan to recheck your chest xray to make sure the fluid at the base of your left lung has gone away.  Ilee Randleman L. Ninah Moccio, D.O. [facility] [address], [place] [address] Cell Phone (Mon-Fri 8am-5pm):  [phone] On Call:  [phone] & follow prompts after 5pm & weekends Office Phone:  [phone] Office Fax:  [phone]

## 2016-10-27 ENCOUNTER — Ambulatory Visit: Payer: Medicare Other | Admitting: Internal Medicine

## 2016-11-07 DIAGNOSIS — M62561 Muscle wasting and atrophy, not elsewhere classified, right lower leg: Secondary | ICD-10-CM | POA: Diagnosis not present

## 2016-11-07 DIAGNOSIS — M238X1 Other internal derangements of right knee: Secondary | ICD-10-CM | POA: Diagnosis not present

## 2016-11-07 DIAGNOSIS — R296 Repeated falls: Secondary | ICD-10-CM | POA: Diagnosis not present

## 2016-11-08 DIAGNOSIS — M62561 Muscle wasting and atrophy, not elsewhere classified, right lower leg: Secondary | ICD-10-CM | POA: Diagnosis not present

## 2016-11-08 DIAGNOSIS — M238X1 Other internal derangements of right knee: Secondary | ICD-10-CM | POA: Diagnosis not present

## 2016-11-08 DIAGNOSIS — R296 Repeated falls: Secondary | ICD-10-CM | POA: Diagnosis not present

## 2016-11-09 ENCOUNTER — Encounter: Payer: Self-pay | Admitting: Internal Medicine

## 2016-11-09 ENCOUNTER — Non-Acute Institutional Stay: Payer: Medicare Other | Admitting: Internal Medicine

## 2016-11-09 VITALS — BP 138/68 | HR 58 | Temp 98.4°F | Wt 207.0 lb

## 2016-11-09 DIAGNOSIS — N183 Chronic kidney disease, stage 3 unspecified: Secondary | ICD-10-CM

## 2016-11-09 DIAGNOSIS — I509 Heart failure, unspecified: Secondary | ICD-10-CM

## 2016-11-09 DIAGNOSIS — I482 Chronic atrial fibrillation, unspecified: Secondary | ICD-10-CM

## 2016-11-09 DIAGNOSIS — W19XXXA Unspecified fall, initial encounter: Secondary | ICD-10-CM

## 2016-11-09 DIAGNOSIS — M238X1 Other internal derangements of right knee: Secondary | ICD-10-CM | POA: Diagnosis not present

## 2016-11-09 DIAGNOSIS — R531 Weakness: Secondary | ICD-10-CM | POA: Diagnosis not present

## 2016-11-09 DIAGNOSIS — R296 Repeated falls: Secondary | ICD-10-CM | POA: Diagnosis not present

## 2016-11-09 DIAGNOSIS — M62561 Muscle wasting and atrophy, not elsewhere classified, right lower leg: Secondary | ICD-10-CM | POA: Diagnosis not present

## 2016-11-09 NOTE — Progress Notes (Signed)
Location:  Occupational psychologist of Service:  Clinic (12)  Provider: Royale Lennartz L. Mariea Clonts, D.O., C.M.D.  Code Status: DNR Goals of Care:  Advanced Directives 10/23/2016  Does Patient Have a Medical Advance Directive? Yes  Type of Paramedic of [place];Living will;Out of facility DNR (pink MOST or yellow form)  Does patient want to make changes to medical advance directive? -  Copy of [place] in Chart? -  Pre-existing out of facility DNR order (yellow form or pink MOST form) -     Chief Complaint  Patient presents with  . Acute Visit    2week follow-up    HPI: Patient is a 83 y.o. male seen today for medical management of chronic diseases.   Today is a good day. Has lost 7 lbs. Since 2/21, has had 2 falls. The first fall occurred after stubbing toe on bed rail, and through him off balance. The second occurred when his right knee gave out. Can he have a brace to wear for support. Has no appetite, therefore the weight loss. The lasix has helped with swelling to BLE and is elevating his legs. Does notice that his urine stream is very "thin" like a wire. Has bedside urinal, gets up 4-5 times a night. Is worried that he has small amount of urine in the urinal compared to before. Has been craving cold things. [place] the [place].   Past Medical History:  Diagnosis Date  . Abnormality of gait 02/2010  . Anal and rectal polyp 1980  . Atrial fibrillation ([place]) 11/2009  . Chronic kidney disease, stage III (moderate) 08/07/2013  . Deviated nasal septum 11/2009  . Diaphragmatic hernia without mention of obstruction or gangrene 2007  . Diarrhea 02/2010  . Dizziness and giddiness 2009  . Edema 2010  . Essential and other specified forms of tremor 2007  . 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 Hyperglycemia 01/02/2013  . Hypertrophy of prostate without urinary obstruction and other lower urinary tract  symptoms (LUTS) 2002  . Insomnia with sleep apnea, unspecified 02/2010  . Irritable bowel syndrome 11/14/2011  . Long term (current) use of anticoagulants 11/2009  . Other abnormal blood chemistry 02/09/2009  . Other B-complex deficiencies 2008  . Other dyspnea and respiratory abnormality 11/2009  . Other malaise and fatigue 2007  . Pain in joint, lower leg 02/2010  . Palpitations 2007  . Reflux esophagitis 2007  . Sebaceous cyst 2008  . Tension headache 2009  . Undiagnosed cardiac murmurs 2002  . Unspecified constipation 07/04/2012  . Unspecified essential hypertension 2007  . Unspecified hereditary and idiopathic peripheral neuropathy 02/2010  . Unspecified transient cerebral ischemia 2002  . Urinary frequency 10/01/2012    No past surgical history on file.  Allergies  Allergen Reactions  . Plavix [Clopidogrel Bisulfate]     indigestion    Allergies as of 11/09/2016      Reactions   Plavix [clopidogrel Bisulfate]    indigestion      Medication List       Accurate as of 11/09/16  4:21 PM. Always use your most recent med list.          Acetaminophen 500 MG coapsule Take by mouth. Take 2 tablets once daily as needed   CENTRUM SILVER PO Take 1 tablet by mouth daily.   doxazosin 4 MG tablet Commonly known as:  CARDURA Take 2 mg by mouth.   furosemide  20 MG tablet Commonly known as:  LASIX Take 1.5 tablets by mouth on Monday, Wednesday and Friday ongoing   loperamide 2 MG tablet Commonly known as:  IMODIUM A-D Take 2 mg by mouth as needed for diarrhea or loose stools.   metoprolol succinate 50 MG 24 hr tablet Commonly known as:  TOPROL-XL Take 50 mg by mouth daily. Take with or immediately following a meal.   polyethylene glycol packet Commonly known as:  MIRALAX / GLYCOLAX Take 17 g by mouth daily as needed for mild constipation.   ranitidine 75 MG tablet Commonly known as:  ZANTAC Take 75 mg by mouth daily.   vitamin B-12 1000 MCG tablet Commonly known as:   CYANOCOBALAMIN Take 1,000 mcg by mouth daily.       Review of Systems:  Review of Systems  Constitutional: Positive for malaise/fatigue. Negative for chills and fever.  HENT: Positive for hearing loss.   Eyes:       Glasses  Respiratory: Negative for cough and shortness of breath.   Cardiovascular: Positive for palpitations and leg swelling. Negative for chest pain.  Gastrointestinal: Positive for constipation and diarrhea. Negative for abdominal pain, blood in stool and melena.  Genitourinary: Positive for frequency and urgency. Negative for dysuria.  Musculoskeletal: Positive for falls and joint pain.       Right knee giving out  Skin: Negative for itching and rash.  Neurological: Positive for weakness. Negative for dizziness and loss of consciousness.  Endo/Heme/Allergies: Bruises/bleeds easily.  Psychiatric/Behavioral: Positive for memory loss. Negative for depression. The patient is not nervous/anxious and does not have insomnia.     Health Maintenance  Topic Date Due  . TETANUS/TDAP  09/05/1988  . INFLUENZA VACCINE  Completed  . PNA vac Low Risk Adult  Completed    Physical Exam: Vitals:   11/09/16 1527  BP: 138/68  Pulse: (!) 58  Temp: 98.4 F (36.9 C)  TempSrc: Oral  SpO2: 97%  Weight: 207 lb (93.9 kg)   Body mass index is 28.47 kg/m. Physical Exam  Constitutional: He is oriented to person, place, and time. He appears well-developed and well-nourished. No distress.  HENT:  Head: Normocephalic and atraumatic.  HOH, hearing aides  Eyes:  glasses  Cardiovascular: Intact distal pulses.   irreg irreg, edema improved after increased lasix to 30mg  every other day and potassium every other day  Pulmonary/Chest: Effort normal and breath sounds normal. He has no rales.  Abdominal: Soft. Bowel sounds are normal.  Musculoskeletal: Normal range of motion. He exhibits tenderness.  Right knee and giving out; comes on scooter  Neurological: He is alert and oriented to  person, place, and time.  Some short term memory loss, does not remember independently to take the as needed metoprolol for symptoms of palpitations so I stopped it, but pt wants to manage his own pills  Skin: Skin is warm and dry. Capillary refill takes less than 2 seconds.  Psychiatric: He has a normal mood and affect.    Labs reviewed: Basic Metabolic Panel:  Recent Labs  03/01/16 0500 10/23/16 1627  NA 142 140  K 4.1 3.8  CL  --  106  CO2  --  26  GLUCOSE  --  124*  BUN 25* 22*  CREATININE 1.3 1.49*  CALCIUM  --  8.7*  MG  --  2.1   Liver Function Tests:  Recent Labs  03/01/16 0500  AST 11*  ALT 10  ALKPHOS 69   No results for input(s):  LIPASE, AMYLASE in the last 8760 hours. No results for input(s): AMMONIA in the last 8760 hours. CBC:  Recent Labs  03/01/16 0500 10/23/16 1627  WBC 4.9 5.1  HGB 13.5 12.8*  HCT 44 39.2  MCV  --  95.1  PLT 128* 120*   Lipid Panel:  Recent Labs  03/01/16 0500  CHOL 119  HDL 56  LDLCALC 52  TRIG 54   Lab Results  Component Value Date   HGBA1C 5.3 03/01/2016    Procedures since last visit: Dg Chest 2 View  Result Date: 10/23/2016 CLINICAL DATA:  Shortness of breath. EXAM: CHEST  2 VIEW COMPARISON:  04/11/2016 FINDINGS: AP and lateral views of the chest. The cardio pericardial silhouette is enlarged. Interstitial markings are diffusely coarsened with chronic features. Basilar atelectasis bilaterally with tiny left pleural effusion. Bones are diffusely demineralized. IMPRESSION: Cardiomegaly with underlying chronic interstitial changes. Basilar atelectasis with tiny left pleural effusion. Electronically Signed   By: Misty Stanley M.D.   On: 10/23/2016 17:02    Assessment/Plan 1. Chronic congestive heart failure, unspecified congestive heart failure type (HCC) -lasix 30mg  Mon, Wed, Fri and kcl 20 mon/wed/fri -cont compression socks in days, elevating feet at rest -low sodium intake--no added salt and avoid high  sodium foods like canned soups -daily weights--will need to keep this at least for a couple of mos ongoing to monitor as not stable right now from this perspective  2. Chronic atrial fibrillation (HCC) -cont toprol xl -pt has not been using his lopressor prn at hs and bp and hr have not been such that he can tolerate an increase of the toprol on a regular basis -d/c the prn lopressor due to non-use -could reinstitute when nursing can give to him upon move [place]  3. Chronic kidney disease, stage III (moderate) -cont to monitor renal function with diuretic adjustments, also avoid nephrotoxic meds and dose adjust as needed  4. Generalized weakness -PT, OT eval and tx, move to [place] where he will have more support and can be monitored more closely due to weakness and falls, some slight cognitive impairment (difficulty with following med instructions)  5. Fall, initial encounter -due to tripping and then knee laxity likely from end stage djd which we cannot fix at 98 -use supportive brace when walking with walker/up and about, pt/ot  6. Joint laxity of right knee -see #5  Labs/tests ordered:  f/u bmp Next appt:  12/14/2016  both his son and daughter were present for this appt  Audelia Knape L. Ciarah Peace, D.O. [facility] [address], [place] [address] Cell Phone (Mon-Fri 8am-5pm):  [phone] On Call:  [phone] & follow prompts after 5pm & weekends Office Phone:  [phone] Office Fax:  [phone]

## 2016-11-10 DIAGNOSIS — M62561 Muscle wasting and atrophy, not elsewhere classified, right lower leg: Secondary | ICD-10-CM | POA: Diagnosis not present

## 2016-11-10 DIAGNOSIS — I509 Heart failure, unspecified: Secondary | ICD-10-CM | POA: Diagnosis not present

## 2016-11-10 DIAGNOSIS — M238X1 Other internal derangements of right knee: Secondary | ICD-10-CM | POA: Diagnosis not present

## 2016-11-10 DIAGNOSIS — R296 Repeated falls: Secondary | ICD-10-CM | POA: Diagnosis not present

## 2016-11-10 LAB — BASIC METABOLIC PANEL
BUN: 42 mg/dL — AB (ref 4–21)
Creatinine: 1.4 mg/dL — AB (ref 0.6–1.3)
GLUCOSE: 106 mg/dL
Potassium: 3.6 mmol/L (ref 3.4–5.3)
Sodium: 143 mmol/L (ref 137–147)

## 2016-11-11 ENCOUNTER — Telehealth: Payer: Self-pay | Admitting: *Deleted

## 2016-11-11 NOTE — Telephone Encounter (Signed)
Spoke with patient about lab results," kidneys stable with diuretics" after talking with patient, he was asking that a xray of his stomach be ordered due to weight loss. Please advise

## 2016-11-13 NOTE — Telephone Encounter (Signed)
I'm not sure that an xray would tell us very much when he is not having other symptoms in his stomach.  Please confirm that he is not having abdominal pain, blood in his stools, dark stools.  I think his weight loss is due to diuretics and increased frailty.

## 2016-11-14 DIAGNOSIS — M238X1 Other internal derangements of right knee: Secondary | ICD-10-CM | POA: Diagnosis not present

## 2016-11-14 DIAGNOSIS — R296 Repeated falls: Secondary | ICD-10-CM | POA: Diagnosis not present

## 2016-11-14 DIAGNOSIS — M62561 Muscle wasting and atrophy, not elsewhere classified, right lower leg: Secondary | ICD-10-CM | POA: Diagnosis not present

## 2016-11-15 DIAGNOSIS — M238X1 Other internal derangements of right knee: Secondary | ICD-10-CM | POA: Diagnosis not present

## 2016-11-15 DIAGNOSIS — R296 Repeated falls: Secondary | ICD-10-CM | POA: Diagnosis not present

## 2016-11-15 DIAGNOSIS — M62561 Muscle wasting and atrophy, not elsewhere classified, right lower leg: Secondary | ICD-10-CM | POA: Diagnosis not present

## 2016-11-15 NOTE — Telephone Encounter (Signed)
Spoke with patient in the hall at [place] and he agrees that his weight loss might be from the diuretics.

## 2016-11-16 DIAGNOSIS — M62561 Muscle wasting and atrophy, not elsewhere classified, right lower leg: Secondary | ICD-10-CM | POA: Diagnosis not present

## 2016-11-16 DIAGNOSIS — M238X1 Other internal derangements of right knee: Secondary | ICD-10-CM | POA: Diagnosis not present

## 2016-11-16 DIAGNOSIS — R296 Repeated falls: Secondary | ICD-10-CM | POA: Diagnosis not present

## 2016-11-17 DIAGNOSIS — R296 Repeated falls: Secondary | ICD-10-CM | POA: Diagnosis not present

## 2016-11-17 DIAGNOSIS — M238X1 Other internal derangements of right knee: Secondary | ICD-10-CM | POA: Diagnosis not present

## 2016-11-17 DIAGNOSIS — M62561 Muscle wasting and atrophy, not elsewhere classified, right lower leg: Secondary | ICD-10-CM | POA: Diagnosis not present

## 2016-11-18 DIAGNOSIS — M238X1 Other internal derangements of right knee: Secondary | ICD-10-CM | POA: Diagnosis not present

## 2016-11-18 DIAGNOSIS — M62561 Muscle wasting and atrophy, not elsewhere classified, right lower leg: Secondary | ICD-10-CM | POA: Diagnosis not present

## 2016-11-18 DIAGNOSIS — R296 Repeated falls: Secondary | ICD-10-CM | POA: Diagnosis not present

## 2016-11-21 DIAGNOSIS — R296 Repeated falls: Secondary | ICD-10-CM | POA: Diagnosis not present

## 2016-11-21 DIAGNOSIS — M62561 Muscle wasting and atrophy, not elsewhere classified, right lower leg: Secondary | ICD-10-CM | POA: Diagnosis not present

## 2016-11-21 DIAGNOSIS — M238X1 Other internal derangements of right knee: Secondary | ICD-10-CM | POA: Diagnosis not present

## 2016-11-22 DIAGNOSIS — R296 Repeated falls: Secondary | ICD-10-CM | POA: Diagnosis not present

## 2016-11-22 DIAGNOSIS — M62561 Muscle wasting and atrophy, not elsewhere classified, right lower leg: Secondary | ICD-10-CM | POA: Diagnosis not present

## 2016-11-22 DIAGNOSIS — M238X1 Other internal derangements of right knee: Secondary | ICD-10-CM | POA: Diagnosis not present

## 2016-11-24 DIAGNOSIS — R296 Repeated falls: Secondary | ICD-10-CM | POA: Diagnosis not present

## 2016-11-24 DIAGNOSIS — M62561 Muscle wasting and atrophy, not elsewhere classified, right lower leg: Secondary | ICD-10-CM | POA: Diagnosis not present

## 2016-11-24 DIAGNOSIS — M238X1 Other internal derangements of right knee: Secondary | ICD-10-CM | POA: Diagnosis not present

## 2016-11-28 DIAGNOSIS — M62561 Muscle wasting and atrophy, not elsewhere classified, right lower leg: Secondary | ICD-10-CM | POA: Diagnosis not present

## 2016-11-28 DIAGNOSIS — M238X1 Other internal derangements of right knee: Secondary | ICD-10-CM | POA: Diagnosis not present

## 2016-11-28 DIAGNOSIS — R296 Repeated falls: Secondary | ICD-10-CM | POA: Diagnosis not present

## 2016-11-29 DIAGNOSIS — R296 Repeated falls: Secondary | ICD-10-CM | POA: Diagnosis not present

## 2016-11-29 DIAGNOSIS — M62561 Muscle wasting and atrophy, not elsewhere classified, right lower leg: Secondary | ICD-10-CM | POA: Diagnosis not present

## 2016-11-29 DIAGNOSIS — M238X1 Other internal derangements of right knee: Secondary | ICD-10-CM | POA: Diagnosis not present

## 2016-12-11 DIAGNOSIS — M238X1 Other internal derangements of right knee: Secondary | ICD-10-CM | POA: Diagnosis not present

## 2016-12-11 DIAGNOSIS — R296 Repeated falls: Secondary | ICD-10-CM | POA: Diagnosis not present

## 2016-12-11 DIAGNOSIS — M62561 Muscle wasting and atrophy, not elsewhere classified, right lower leg: Secondary | ICD-10-CM | POA: Diagnosis not present

## 2016-12-12 DIAGNOSIS — R296 Repeated falls: Secondary | ICD-10-CM | POA: Diagnosis not present

## 2016-12-12 DIAGNOSIS — M238X1 Other internal derangements of right knee: Secondary | ICD-10-CM | POA: Diagnosis not present

## 2016-12-12 DIAGNOSIS — M62561 Muscle wasting and atrophy, not elsewhere classified, right lower leg: Secondary | ICD-10-CM | POA: Diagnosis not present

## 2016-12-14 ENCOUNTER — Encounter: Payer: Self-pay | Admitting: Internal Medicine

## 2016-12-14 ENCOUNTER — Non-Acute Institutional Stay: Payer: Medicare Other | Admitting: Internal Medicine

## 2016-12-14 VITALS — BP 138/68 | HR 52 | Temp 97.7°F | Wt 212.0 lb

## 2016-12-14 DIAGNOSIS — N183 Chronic kidney disease, stage 3 unspecified: Secondary | ICD-10-CM

## 2016-12-14 DIAGNOSIS — I482 Chronic atrial fibrillation, unspecified: Secondary | ICD-10-CM

## 2016-12-14 DIAGNOSIS — R531 Weakness: Secondary | ICD-10-CM | POA: Diagnosis not present

## 2016-12-14 DIAGNOSIS — E538 Deficiency of other specified B group vitamins: Secondary | ICD-10-CM | POA: Diagnosis not present

## 2016-12-14 DIAGNOSIS — M238X1 Other internal derangements of right knee: Secondary | ICD-10-CM

## 2016-12-14 DIAGNOSIS — I509 Heart failure, unspecified: Secondary | ICD-10-CM

## 2016-12-14 MED ORDER — FUROSEMIDE 20 MG PO TABS: ORAL_TABLET | ORAL | 3 refills | Status: DC

## 2016-12-14 MED ORDER — POTASSIUM CHLORIDE 20 MEQ PO PACK: PACK | ORAL | 3 refills | Status: DC

## 2016-12-14 MED ORDER — METOPROLOL TARTRATE 25 MG PO TABS
12.5000 mg | ORAL_TABLET | ORAL | 0 refills | Status: DC | PRN
Start: 2016-12-14 — End: 2017-02-14

## 2016-12-14 NOTE — Progress Notes (Signed)
Location:  [facility] clinic Provider:  Prayan Ulin L. Mariea [name], D.O., C.M.D.  Code Status: DNR Goals of Care:  Advanced Directives 12/14/2016  Does Patient Have a Medical Advance Directive? Yes  Type of Advance Directive Out of facility DNR (pink MOST or yellow form);[place] [name];Living will  Does patient want to make changes to medical advance directive? -  Copy of [place] [name] in Chart? Yes  Pre-existing out of facility DNR order (yellow form or pink MOST form) Yellow form placed in chart (order not valid for inpatient use)    Chief Complaint  Patient presents with  . Medical Management of Chronic Issues    62mth follow-up    HPI: Patient is a 84 y.o. male seen today for medical management of chronic diseases.    Asks if he should have b12 supplement via shot or pill.  Needs level.  Lasix 30mg  m/w/f makes him urinate a lot.  Has no metoprolol tartrate 12.5mg  daily as needed for palpitations.   Needs more lasix also.    MVI not needed with regular meals.  Requests weights be changed to three times weekly.  Is eating a little less than he did but appetite better than when in acute chf.   Got a case of miralax.  Likes to use his scooter and portable walker.  Has rollator walker for in his apt.  Reports no feelings of falling lately due to his knee.  He feels now that balance is a beter issue.  Knees hurt at night--helped some by aspercreme.  Getting new bed today that may help and better mattress b/c gets tired of bed and soreness in the knees.      Past Medical History:  Diagnosis Date  . Abnormality of gait 02/2010  . Anal and rectal polyp 1980  . Atrial fibrillation ([place]) 11/2009  . Chronic kidney disease, stage III (moderate) 08/07/2013  . Deviated nasal septum 11/2009  . Diaphragmatic hernia without mention of obstruction or gangrene 2007  . Diarrhea 02/2010  . Dizziness and giddiness 2009  . Edema 2010  . Essential and other specified forms of tremor  2007  . 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 Hyperglycemia 01/02/2013  . Hypertrophy of prostate without urinary obstruction and other lower urinary tract symptoms (LUTS) 2002  . Insomnia with sleep apnea, unspecified 02/2010  . Irritable bowel syndrome 11/14/2011  . Long term (current) use of anticoagulants 11/2009  . Other abnormal blood chemistry 02/09/2009  . Other B-complex deficiencies 2008  . Other dyspnea and respiratory abnormality 11/2009  . Other malaise and fatigue 2007  . Pain in joint, lower leg 02/2010  . Palpitations 2007  . Reflux esophagitis 2007  . Sebaceous cyst 2008  . Tension headache 2009  . Undiagnosed cardiac murmurs 2002  . Unspecified constipation 07/04/2012  . Unspecified essential hypertension 2007  . Unspecified hereditary and idiopathic peripheral neuropathy 02/2010  . Unspecified transient cerebral ischemia 2002  . Urinary frequency 10/01/2012    No past surgical history on file.  Allergies  Allergen Reactions  . Plavix [Clopidogrel Bisulfate]     indigestion    Allergies as of 12/14/2016      Reactions   Plavix [clopidogrel Bisulfate]    indigestion      Medication List       Accurate as of 12/14/16 11:36 AM. Always use your most recent med list.          Acetaminophen 500 MG coapsule Take by  mouth. Take 2 tablets once daily as needed   CENTRUM [place] PO Take 1 tablet by mouth daily.   doxazosin 4 MG tablet Commonly known as:  CARDURA Take 2 mg by mouth.   furosemide 20 MG tablet Commonly known as:  LASIX Take 1.5 tablets by mouth on Monday, Wednesday and Friday ongoing   metoprolol succinate 50 MG 24 hr tablet Commonly known as:  TOPROL-XL Take 50 mg by mouth daily. Take with or immediately following a meal.   metoprolol tartrate 25 MG tablet Commonly known as:  LOPRESSOR Take 12.5 mg by mouth as needed (for palpitations).   oseltamivir 30 MG capsule Commonly known as:  TAMIFLU Take 30 mg by mouth  daily.   polyethylene glycol packet Commonly known as:  MIRALAX / GLYCOLAX Take 17 g by mouth daily as needed for mild constipation.   potassium chloride 20 MEQ packet Commonly known as:  KLOR-CON Take 1 tablet by mouth every Monday, Wednesday and Friday   ranitidine 75 MG tablet Commonly known as:  ZANTAC Take 75 mg by mouth daily.   trolamine salicylate 10 % cream Commonly known as:  ASPERCREME Apply 1 application topically 2 (two) times daily as needed.   vitamin B-12 1000 MCG tablet Commonly known as:  CYANOCOBALAMIN Take 1,000 mcg by mouth daily.       Review of Systems:  Review of Systems  Constitutional: Negative for chills, fever, malaise/fatigue and weight loss.  HENT: Positive for hearing loss.        Hearing aides  Eyes: Negative for blurred vision.       Glasses  Respiratory: Negative for cough, sputum production, shortness of breath and wheezing.   Cardiovascular: Positive for leg swelling. Negative for chest pain and palpitations.  Gastrointestinal: Positive for constipation and diarrhea. Negative for abdominal pain, blood in stool and melena.  Genitourinary: Positive for frequency and urgency. Negative for dysuria.       Some incontinence from diuretic  Musculoskeletal: Negative for falls.  Skin: Negative for itching and rash.  Neurological: Positive for weakness. Negative for dizziness and loss of consciousness.  Endo/Heme/Allergies: Bruises/bleeds easily.  Psychiatric/Behavioral: Positive for memory loss.       MCI, does not remember medications on list fully    Health Maintenance  Topic Date Due  . TETANUS/TDAP  09/05/1988  . INFLUENZA VACCINE  04/05/2017  . PNA vac Low Risk Adult  Completed    Physical Exam: Vitals:   12/14/16 1120  BP: 138/68  Pulse: (!) 52  Temp: 97.7 F (36.5 C)  TempSrc: Oral  SpO2: 98%  Weight: 212 lb (96.2 kg)   Body mass index is 29.16 kg/m. Physical Exam  Constitutional: He is oriented to person, place, and  time. He appears well-developed and well-nourished. No distress.  HENT:  Head: Normocephalic and atraumatic.  Cardiovascular:  Irregularly irregular, on slower end of normal today  Pulmonary/Chest: Effort normal and breath sounds normal. He has no rales.  Abdominal: Bowel sounds are normal.  Musculoskeletal: Normal range of motion.  Came with power scooter and walked from hall into room with portable walker, difficulty getting up out of chair using arms  Neurological: He is alert and oriented to person, place, and time.  Repeating questions some during visit  Skin: Skin is warm and dry. Capillary refill takes less than 2 seconds.  Dry scaly skin  Psychiatric: He has a normal mood and affect.    Labs reviewed: Basic Metabolic Panel:  Recent Labs  03/01/16 0500  10/23/16 1627 11/10/16  NA 142 140 143  K 4.1 3.8 3.6  CL  --  106  --   CO2  --  26  --   GLUCOSE  --  124*  --   BUN 25* 22* 42*  CREATININE 1.3 1.49* 1.4*  CALCIUM  --  8.7*  --   MG  --  2.1  --    Liver Function Tests:  Recent Labs  03/01/16 0500  AST 11*  ALT 10  ALKPHOS 69   No results for input(s): LIPASE, AMYLASE in the last 8760 hours. No results for input(s): AMMONIA in the last 8760 hours. CBC:  Recent Labs  03/01/16 0500 10/23/16 1627  WBC 4.9 5.1  HGB 13.5 12.8*  HCT 44 39.2  MCV  --  95.1  PLT 128* 120*   Lipid Panel:  Recent Labs  03/01/16 0500  CHOL 119  HDL 56  LDLCALC 52  TRIG 54   Lab Results  Component Value Date   HGBA1C 5.3 03/01/2016    Assessment/Plan 1. Chronic congestive heart failure, unspecified congestive heart failure type (HCC) - weight trending up, but this time no increase in his edema or shortness of breath, no rales on exam so suspect he's eating better -if continues to trend up, may need short term daily lasix to adjust, but continuously complaining of urinating so often and having some incontinence -decrease daily weights to three times weekly on  mon/wed/fri - metoprolol tartrate (LOPRESSOR) 25 MG tablet; Take 0.5 tablets (12.5 mg total) by mouth as needed (for palpitations).  Dispense: 30 tablet; Refill: 0 - furosemide (LASIX) 20 MG tablet; Take 1.5 tablets by mouth on Monday, Wednesday and Friday ongoing  Dispense: 45 tablet; Refill: 3 - potassium chloride (KLOR-CON) 20 MEQ packet; Take 1 packet by mouth every Monday, Wednesday and Friday  Dispense: 36 packet; Refill: 3  2. Chronic atrial fibrillation (HCC) -no recent episodes of RVR, has prn metoprolol for this which I had wanted nursing staff to administer, but he insists upon having it for himself to give  3. Chronic kidney disease, stage III (moderate) -will need to reassess bmp before next appt  4. B12 deficiency -not clear if he actually ever had a deficiency or just took shots and vitamins -check level--has been taking otc supplement  5. Joint laxity of right knee -reports not recent problems with this -cont use of rollator walker in room, scooter long distances and portable walker when gets off scooter at [facility]  6. Generalized weakness -ongoing, has had PT, he denies it though it's visible when watching him get out of a chair--says he's fatigued and thinks it's related to b12 -adamantly refuses power chair and says it's not due to cost but b/c he does not need it and is doing fine with rollator--we shall see how he does--family supportive of his decision  Labs/tests ordered:  b12 next draw, bmp before his regular appt  Next appt:  3 months   Tytianna Greenley L. Earla Charlie, D.O. [facility] [address], [place] [address] Cell Phone (Mon-Fri 8am-5pm):  [phone] On Call:  [phone] & follow prompts after 5pm & weekends Office Phone:  [phone] Office Fax:  [phone]

## 2016-12-15 ENCOUNTER — Other Ambulatory Visit: Payer: Self-pay

## 2016-12-15 DIAGNOSIS — I509 Heart failure, unspecified: Secondary | ICD-10-CM

## 2016-12-15 DIAGNOSIS — I482 Chronic atrial fibrillation, unspecified: Secondary | ICD-10-CM

## 2016-12-15 MED ORDER — FUROSEMIDE 20 MG PO TABS: ORAL_TABLET | ORAL | 3 refills | Status: DC

## 2016-12-15 NOTE — Telephone Encounter (Signed)
RX was sent over yesterday for #45, The quantity originally prescribed is less than allowed on patient's prescription plan. Increasing the days supply may allow the patient to take full advantage of their plan benefits.   RX should be for 59 tablets for a 90 day supply   RX resubmitted for 90 day supply

## 2016-12-16 DIAGNOSIS — D518 Other vitamin B12 deficiency anemias: Secondary | ICD-10-CM | POA: Diagnosis not present

## 2016-12-16 DIAGNOSIS — D649 Anemia, unspecified: Secondary | ICD-10-CM | POA: Diagnosis not present

## 2016-12-18 DIAGNOSIS — R296 Repeated falls: Secondary | ICD-10-CM | POA: Diagnosis not present

## 2016-12-18 DIAGNOSIS — M238X1 Other internal derangements of right knee: Secondary | ICD-10-CM | POA: Diagnosis not present

## 2016-12-18 DIAGNOSIS — M62561 Muscle wasting and atrophy, not elsewhere classified, right lower leg: Secondary | ICD-10-CM | POA: Diagnosis not present

## 2016-12-19 DIAGNOSIS — M62561 Muscle wasting and atrophy, not elsewhere classified, right lower leg: Secondary | ICD-10-CM | POA: Diagnosis not present

## 2016-12-19 DIAGNOSIS — M238X1 Other internal derangements of right knee: Secondary | ICD-10-CM | POA: Diagnosis not present

## 2016-12-19 DIAGNOSIS — R296 Repeated falls: Secondary | ICD-10-CM | POA: Diagnosis not present

## 2016-12-20 DIAGNOSIS — R296 Repeated falls: Secondary | ICD-10-CM | POA: Diagnosis not present

## 2016-12-20 DIAGNOSIS — M62561 Muscle wasting and atrophy, not elsewhere classified, right lower leg: Secondary | ICD-10-CM | POA: Diagnosis not present

## 2016-12-20 DIAGNOSIS — M238X1 Other internal derangements of right knee: Secondary | ICD-10-CM | POA: Diagnosis not present

## 2016-12-21 DIAGNOSIS — M62561 Muscle wasting and atrophy, not elsewhere classified, right lower leg: Secondary | ICD-10-CM | POA: Diagnosis not present

## 2016-12-21 DIAGNOSIS — R296 Repeated falls: Secondary | ICD-10-CM | POA: Diagnosis not present

## 2016-12-21 DIAGNOSIS — M238X1 Other internal derangements of right knee: Secondary | ICD-10-CM | POA: Diagnosis not present

## 2016-12-28 DIAGNOSIS — M62561 Muscle wasting and atrophy, not elsewhere classified, right lower leg: Secondary | ICD-10-CM | POA: Diagnosis not present

## 2016-12-28 DIAGNOSIS — R296 Repeated falls: Secondary | ICD-10-CM | POA: Diagnosis not present

## 2016-12-28 DIAGNOSIS — M238X1 Other internal derangements of right knee: Secondary | ICD-10-CM | POA: Diagnosis not present

## 2016-12-29 DIAGNOSIS — M238X1 Other internal derangements of right knee: Secondary | ICD-10-CM | POA: Diagnosis not present

## 2016-12-29 DIAGNOSIS — R296 Repeated falls: Secondary | ICD-10-CM | POA: Diagnosis not present

## 2016-12-29 DIAGNOSIS — M62561 Muscle wasting and atrophy, not elsewhere classified, right lower leg: Secondary | ICD-10-CM | POA: Diagnosis not present

## 2017-01-02 ENCOUNTER — Encounter: Payer: Self-pay | Admitting: Adult Health

## 2017-01-02 ENCOUNTER — Non-Acute Institutional Stay: Payer: Medicare Other | Admitting: Adult Health

## 2017-01-02 DIAGNOSIS — N401 Enlarged prostate with lower urinary tract symptoms: Secondary | ICD-10-CM | POA: Diagnosis not present

## 2017-01-02 DIAGNOSIS — R350 Frequency of micturition: Secondary | ICD-10-CM

## 2017-01-02 DIAGNOSIS — I10 Essential (primary) hypertension: Secondary | ICD-10-CM | POA: Diagnosis not present

## 2017-01-02 DIAGNOSIS — E039 Hypothyroidism, unspecified: Secondary | ICD-10-CM | POA: Diagnosis not present

## 2017-01-02 DIAGNOSIS — E038 Other specified hypothyroidism: Secondary | ICD-10-CM | POA: Diagnosis not present

## 2017-01-02 DIAGNOSIS — I482 Chronic atrial fibrillation, unspecified: Secondary | ICD-10-CM

## 2017-01-02 DIAGNOSIS — I509 Heart failure, unspecified: Secondary | ICD-10-CM

## 2017-01-02 DIAGNOSIS — N183 Chronic kidney disease, stage 3 unspecified: Secondary | ICD-10-CM

## 2017-01-02 DIAGNOSIS — D649 Anemia, unspecified: Secondary | ICD-10-CM | POA: Diagnosis not present

## 2017-01-02 NOTE — Progress Notes (Addendum)
Location:  Occupational psychologist of Service:  ALF (13) Provider:   Cindi Carbon, ANP [place] [phone]   REED, Jonelle Sidle, DO  Patient Care Team: Gayland Curry, DO as PCP - General (Geriatric Medicine) [facility] [place], Lucky as Consulting Physician (Oral Surgery)  Extended Emergency Contact Information Primary Emergency Contact: Nyra Capes [place] Phone: [phone] Relation: Son  Code Status:  DNR Goals of care: Advanced Directive information Advanced Directives 12/14/2016  Does Patient Have a Medical Advance Directive? Yes  Type of Advance Directive Out of facility DNR (pink MOST or yellow form);[place];Living will  Does patient want to make changes to medical advance directive? -  Copy of [place] in Chart? Yes  Pre-existing out of facility DNR order (yellow form or pink MOST form) Yellow form placed in chart (order not valid for inpatient use)     Chief Complaint  Patient presents with  . Acute Visit    SOB,and weight gain    HPI:  Pt is a 83 y.o. male seen today for an acute visit for weight gain and SOB.  He reports for the past two weeks he is short of breath at night.  His weight has increased to 215 lbs, a 7 lb gain in 2 weeks. He has edema in both feet which is unchanged by his account but he does not wear prescribed compression hose regularly as they are hard to get on and off and he doesn't want to bother the staff.   He does not have any chest pain. He reports palpitations at night occasionally where he feels that his heart "quivers".  He was prescribed metoprolol for this but states he rarely uses it.  The symptoms occur rarely as well. His HR has been running low 38-47 on EKG which shows afib slow rate.  He denies any cough or cold symptoms Has LUTS and urinates frequently. For this reason he has been on Lasix 30 mg M W and F. He  was seen in the ER in Feb for fluid overload and prescribed increased lasix dosing. He is off NOACs due to rectal bleeding and hemoptysis. He reports that he has been sleeping much and the day and feels tired all the time.   No recent echo for review.   Past Medical History:  Diagnosis Date  . Abnormality of gait 02/2010  . Anal and rectal polyp 1980  . Atrial fibrillation ([place]) 11/2009  . Chronic kidney disease, stage III (moderate) 08/07/2013  . Deviated nasal septum 11/2009  . Diaphragmatic hernia without mention of obstruction or gangrene 2007  . Diarrhea 02/2010  . Dizziness and giddiness 2009  . Edema 2010  . Essential and other specified forms of tremor 2007  . 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 Hyperglycemia 01/02/2013  . Hypertrophy of prostate without urinary obstruction and other lower urinary tract symptoms (LUTS) 2002  . Insomnia with sleep apnea, unspecified 02/2010  . Irritable bowel syndrome 11/14/2011  . Long term (current) use of anticoagulants 11/2009  . Other abnormal blood chemistry 02/09/2009  . Other B-complex deficiencies 2008  . Other dyspnea and respiratory abnormality 11/2009  . Other malaise and fatigue 2007  . Pain in joint, lower leg 02/2010  . Palpitations 2007  . Reflux esophagitis 2007  . Sebaceous cyst 2008  . Tension headache 2009  . Undiagnosed cardiac murmurs 2002  . Unspecified constipation 07/04/2012  .  Unspecified essential hypertension 2007  . Unspecified hereditary and idiopathic peripheral neuropathy 02/2010  . Unspecified transient cerebral ischemia 2002  . Urinary frequency 10/01/2012   History reviewed. No pertinent surgical history.  Allergies  Allergen Reactions  . Plavix [Clopidogrel Bisulfate]     indigestion    Outpatient Encounter Prescriptions as of 01/02/2017  Medication Sig  . Acetaminophen 500 MG coapsule Take by mouth. Take 2 tablets once daily as needed  . doxazosin (CARDURA) 4 MG tablet Take 2 mg  by mouth.   . furosemide (LASIX) 20 MG tablet Take 1.5 tablets by mouth on Monday, Wednesday and Friday ongoing  . metoprolol succinate (TOPROL-XL) 50 MG 24 hr tablet Take 50 mg by mouth daily. Take with or immediately following a meal.  . metoprolol tartrate (LOPRESSOR) 25 MG tablet Take 0.5 tablets (12.5 mg total) by mouth as needed (for palpitations).  . Multiple Vitamins-Minerals (CENTRUM SILVER PO) Take 1 tablet by mouth daily.   . polyethylene glycol (MIRALAX / GLYCOLAX) packet Take 17 g by mouth daily as needed for mild constipation.   . potassium chloride (KLOR-CON) 20 MEQ packet Take 1 packet by mouth every Monday, Wednesday and Friday  . ranitidine (ZANTAC) 75 MG tablet Take 75 mg by mouth daily.   Marland Kitchen trolamine salicylate (ASPERCREME) 10 % cream Apply 1 application topically 2 (two) times daily as needed.  . vitamin B-12 (CYANOCOBALAMIN) 1000 MCG tablet Take 1,000 mcg by mouth daily.   No facility-administered encounter medications on file as of 01/02/2017.     Review of Systems  Constitutional: Positive for fatigue and unexpected weight change.  HENT: Negative for ear pain.   Respiratory: Positive for shortness of breath. Negative for cough and wheezing.   Cardiovascular: Positive for palpitations and leg swelling. Negative for chest pain.  Gastrointestinal: Positive for abdominal distention (he says due to weight gain, pants tight). Negative for abdominal pain, anal bleeding, blood in stool, constipation, diarrhea, nausea and vomiting.  Genitourinary: Positive for frequency and urgency. Negative for decreased urine volume, difficulty urinating, discharge, dysuria, flank pain, hematuria, penile swelling, scrotal swelling and testicular pain.  Musculoskeletal: Positive for gait problem (uses walker). Negative for arthralgias and back pain.  Neurological: Positive for tremors. Negative for seizures, syncope, speech difficulty, light-headedness and numbness.  Psychiatric/Behavioral:  Negative for agitation and confusion.    Immunization History  Administered Date(s) Administered  . Influenza Inj Mdck Quad Pf 06/23/2016  . Influenza-Unspecified 06/11/2013, 06/23/2014, 06/25/2015  . Pneumococcal Conjugate-13 09/09/2015  . Pneumococcal Polysaccharide-23 09/05/2008  . Td 09/05/1978   Pertinent  Health Maintenance Due  Topic Date Due  . INFLUENZA VACCINE  04/05/2017  . PNA vac Low Risk Adult  Completed   Fall Risk  10/26/2016 07/13/2016 04/20/2016 04/11/2016 03/09/2016  Falls in the past year? No No No No No  Number falls in past yr: - - - - -  Injury with Fall? - - - - -   Functional Status Survey:    Vitals:   01/02/17 1039  BP: (!) 144/72  Pulse: (!) 47  Resp: 20  Temp: 97.6 F (36.4 C)  SpO2: 97%  Weight: 215 lb (97.5 kg)   Body mass index is 29.57 kg/m. Physical Exam  Constitutional: He is oriented to person, place, and time. No distress.  HENT:  Head: Normocephalic and atraumatic.  Neck: Normal range of motion. Neck supple. No JVD present. No tracheal deviation present. No thyromegaly present.  Cardiovascular:  No murmur heard. Slow, irregular.  BLE edema with  erythema +2  Pulmonary/Chest: Effort normal and breath sounds normal. No respiratory distress. He has no wheezes.  Abdominal: Soft. Bowel sounds are normal. He exhibits no distension. There is no tenderness.  Lymphadenopathy:    He has no cervical adenopathy.  Neurological: He is alert and oriented to person, place, and time. No cranial nerve deficit.  Skin: Skin is warm and dry. He is not diaphoretic.  Psychiatric: He has a normal mood and affect.  Nursing note and vitals reviewed.   Labs reviewed:  Recent Labs  03/01/16 0500 10/23/16 1627 11/10/16  NA 142 140 143  K 4.1 3.8 3.6  CL  --  106  --   CO2  --  26  --   GLUCOSE  --  124*  --   BUN 25* 22* 42*  CREATININE 1.3 1.49* 1.4*  CALCIUM  --  8.7*  --   MG  --  2.1  --     Recent Labs  03/01/16 0500  AST 11*  ALT 10    ALKPHOS 69    Recent Labs  03/01/16 0500 10/23/16 1627  WBC 4.9 5.1  HGB 13.5 12.8*  HCT 44 39.2  MCV  --  95.1  PLT 128* 120*   Lab Results  Component Value Date   TSH 5.03 12/23/2014   Lab Results  Component Value Date   HGBA1C 5.3 03/01/2016   Lab Results  Component Value Date   CHOL 119 03/01/2016   HDL 56 03/01/2016   LDLCALC 52 03/01/2016   TRIG 54 03/01/2016    Significant Diagnostic Results in last 30 days:  No results found.  Assessment/Plan  1. Acute on chronic congestive heart failure, unspecified heart failure type ([place]) Noted weight gain and PND Increase lasix to 30 mg qd x 7 days then back to M W F Increase Kdur 20 meq qd x 7 days, then back to M W F Weight M W F Encouraged him to wear compression hose and have the staff help him to get them on Diet education provided If no improvement return to see Dr. Mariea Clonts next week  2. Chronic atrial fibrillation (HCC) Rate slow, not syncopal Check labs Education provided that metoprolol slows the rate and should only be used if the rate is fast. He should have the nurse check his pulse rate before self administering. Continue to monitor Goals of care are comfort based per review of the records  3. Chronic kidney disease, stage III (moderate) Check BMP and avoid nephrotoxic drugs  4. Benign prostatic hyperplasia with urinary frequency Encouraged him to get a urinal and use in the chair for the next week If frequent urination continues to be an issue consider increasing Cardura   Family/ staff Communication: discussed with resident and his son Laurey Arrow  Labs/tests ordered:  CBC BMP EKG TSH

## 2017-01-03 DIAGNOSIS — M238X1 Other internal derangements of right knee: Secondary | ICD-10-CM | POA: Diagnosis not present

## 2017-01-03 DIAGNOSIS — M62561 Muscle wasting and atrophy, not elsewhere classified, right lower leg: Secondary | ICD-10-CM | POA: Diagnosis not present

## 2017-01-03 DIAGNOSIS — R296 Repeated falls: Secondary | ICD-10-CM | POA: Diagnosis not present

## 2017-02-13 DIAGNOSIS — E039 Hypothyroidism, unspecified: Secondary | ICD-10-CM | POA: Diagnosis not present

## 2017-02-14 ENCOUNTER — Other Ambulatory Visit: Payer: Self-pay | Admitting: Internal Medicine

## 2017-02-14 DIAGNOSIS — I509 Heart failure, unspecified: Secondary | ICD-10-CM

## 2017-02-21 ENCOUNTER — Non-Acute Institutional Stay: Payer: Medicare Other

## 2017-02-21 DIAGNOSIS — Z0000 Encounter for general adult medical examination without abnormal findings: Secondary | ICD-10-CM

## 2017-02-21 NOTE — Patient Instructions (Addendum)
Gary [name] , Thank you for taking time to come for your Medicare Wellness Visit. I appreciate your ongoing commitment to your health goals. Please review the following plan we discussed and let me know if I can assist you in the future.   Screening recommendations/referrals: Colonoscopy up to date, pt over age 81 Recommended yearly ophthalmology/optometry visit for glaucoma screening and checkup Recommended yearly dental visit for hygiene and checkup  Vaccinations: Influenza vaccine up to date. Due 06/23/17 Pneumococcal vaccine up to date Tdap vaccine due Shingles vaccine not in records  Advanced directives: In Chart  Conditions/risks identified: None  Next appointment: 03/22/17 @ 11 am  Preventive Care 65 Years and Older, Male Preventive care refers to lifestyle choices and visits with your health care provider that can promote health and wellness. What does preventive care include?  A yearly physical exam. This is also called an annual well check.  Dental exams once or twice a year.  Routine eye exams. Ask your health care provider how often you should have your eyes checked.  Personal lifestyle choices, including:  Daily care of your teeth and gums.  Regular physical activity.  Eating a healthy diet.  Avoiding tobacco and drug use.  Limiting alcohol use.  Practicing safe sex.  Taking low doses of aspirin every day.  Taking vitamin and mineral supplements as recommended by your health care provider. What happens during an annual well check? The services and screenings done by your health care provider during your annual well check will depend on your age, overall health, lifestyle risk factors, and family history of disease. Counseling  Your health care provider may ask you questions about your:  Alcohol use.  Tobacco use.  Drug use.  Emotional well-being.  Home and relationship well-being.  Sexual activity.  Eating habits.  History of  falls.  Memory and ability to understand (cognition).  Work and work Statistician. Screening  You may have the following tests or measurements:  Height, weight, and BMI.  Blood pressure.  Lipid and cholesterol levels. These may be checked every 5 years, or more frequently if you are over 17 years old.  Skin check.  Lung cancer screening. You may have this screening every year starting at age 51 if you have a 30-pack-year history of smoking and currently smoke or have quit within the past 15 years.  Fecal occult blood test (FOBT) of the stool. You may have this test every year starting at age 12.  Flexible sigmoidoscopy or colonoscopy. You may have a sigmoidoscopy every 5 years or a colonoscopy every 10 years starting at age 65.  Prostate cancer screening. Recommendations will vary depending on your family history and other risks.  Hepatitis C blood test.  Hepatitis B blood test.  Sexually transmitted disease (STD) testing.  Diabetes screening. This is done by checking your blood sugar (glucose) after you have not eaten for a while (fasting). You may have this done every 1-3 years.  Abdominal aortic aneurysm (AAA) screening. You may need this if you are a current or former smoker.  Osteoporosis. You may be screened starting at age 58 if you are at high risk. Talk with your health care provider about your test results, treatment options, and if necessary, the need for more tests. Vaccines  Your health care provider may recommend certain vaccines, such as:  Influenza vaccine. This is recommended every year.  Tetanus, diphtheria, and acellular pertussis (Tdap, Td) vaccine. You may need a Td booster every 10 years.  Zoster  vaccine. You may need this after age 23.  Pneumococcal 13-valent conjugate (PCV13) vaccine. One dose is recommended after age 41.  Pneumococcal polysaccharide (PPSV23) vaccine. One dose is recommended after age 68. Talk to your health care provider about  which screenings and vaccines you need and how often you need them. This information is not intended to replace advice given to you by your health care provider. Make sure you discuss any questions you have with your health care provider. Document Released: 09/18/2015 Document Revised: 05/11/2016 Document Reviewed: 06/23/2015 Elsevier Interactive Patient Education  2017 [employer] Prevention in the Home Falls can cause injuries. They can happen to people of all ages. There are many things you can do to make your home safe and to help prevent falls. What can I do on the outside of my home?  Regularly fix the edges of walkways and driveways and fix any cracks.  Remove anything that might make you trip as you walk through a door, such as a raised step or threshold.  Trim any bushes or trees on the path to your home.  Use bright outdoor lighting.  Clear any walking paths of anything that might make someone trip, such as rocks or tools.  Regularly check to see if handrails are loose or broken. Make sure that both sides of any steps have handrails.  Any raised decks and porches should have guardrails on the edges.  Have any leaves, snow, or ice cleared regularly.  Use sand or salt on walking paths during winter.  Clean up any spills in your garage right away. This includes oil or grease spills. What can I do in the bathroom?  Use night lights.  Install grab bars by the toilet and in the tub and shower. Do not use towel bars as grab bars.  Use non-skid mats or decals in the tub or shower.  If you need to sit down in the shower, use a plastic, non-slip stool.  Keep the floor dry. Clean up any water that spills on the floor as soon as it happens.  Remove soap buildup in the tub or shower regularly.  Attach bath mats securely with double-sided non-slip rug tape.  Do not have throw rugs and other things on the floor that can make you trip. What can I do in the  bedroom?  Use night lights.  Make sure that you have a light by your bed that is easy to reach.  Do not use any sheets or blankets that are too big for your bed. They should not hang down onto the floor.  Have a firm chair that has side arms. You can use this for support while you get dressed.  Do not have throw rugs and other things on the floor that can make you trip. What can I do in the kitchen?  Clean up any spills right away.  Avoid walking on wet floors.  Keep items that you use a lot in easy-to-reach places.  If you need to reach something above you, use a strong step stool that has a grab bar.  Keep electrical cords out of the way.  Do not use floor polish or wax that makes floors slippery. If you must use wax, use non-skid floor wax.  Do not have throw rugs and other things on the floor that can make you trip. What can I do with my stairs?  Do not leave any items on the stairs.  Make sure that there are handrails  on both sides of the stairs and use them. Fix handrails that are broken or loose. Make sure that handrails are as long as the stairways.  Check any carpeting to make sure that it is firmly attached to the stairs. Fix any carpet that is loose or worn.  Avoid having throw rugs at the top or bottom of the stairs. If you do have throw rugs, attach them to the floor with carpet tape.  Make sure that you have a light switch at the top of the stairs and the bottom of the stairs. If you do not have them, ask someone to add them for you. What else can I do to help prevent falls?  Wear shoes that:  Do not have high heels.  Have rubber bottoms.  Are comfortable and fit you well.  Are closed at the toe. Do not wear sandals.  If you use a stepladder:  Make sure that it is fully opened. Do not climb a closed stepladder.  Make sure that both sides of the stepladder are locked into place.  Ask someone to hold it for you, if possible.  Clearly mark and make  sure that you can see:  Any grab bars or handrails.  First and last steps.  Where the edge of each step is.  Use tools that help you move around (mobility aids) if they are needed. These include:  Canes.  Walkers.  Scooters.  Crutches.  Turn on the lights when you go into a dark area. Replace any light bulbs as soon as they burn out.  Set up your furniture so you have a clear path. Avoid moving your furniture around.  If any of your floors are uneven, fix them.  If there are any pets around you, be aware of where they are.  Review your medicines with your doctor. Some medicines can make you feel dizzy. This can increase your chance of falling. Ask your doctor what other things that you can do to help prevent falls. This information is not intended to replace advice given to you by your health care provider. Make sure you discuss any questions you have with your health care provider. Document Released: 06/18/2009 Document Revised: 01/28/2016 Document Reviewed: 09/26/2014 Elsevier Interactive Patient Education  2017 [employer].

## 2017-02-21 NOTE — Progress Notes (Signed)
Subjective:   Gary [name] is a 83 y.o. male who presents for an Initial Medicare Annual Wellness Visit at [facility] facility   Objective:    Today's Vitals   02/21/17 1118  BP: 140/60  Pulse: (!) 52  Temp: 98.1 F (36.7 C)  TempSrc: Oral  SpO2: 96%  Weight: 215 lb (97.5 kg)  Height: 6' (1.829 m)   Body mass index is 29.16 kg/m.  Current Medications (verified) Outpatient Encounter Prescriptions as of 02/21/2017  Medication Sig  . Acetaminophen 500 MG coapsule Take by mouth. Take 2 tablets once daily as needed  . doxazosin (CARDURA) 4 MG tablet Take 2 mg by mouth.   . furosemide (LASIX) 20 MG tablet Take 1.5 tablets by mouth on Monday, Wednesday and Friday ongoing  . metoprolol succinate (TOPROL-XL) 50 MG 24 hr tablet Take 50 mg by mouth daily. Take with or immediately following a meal.  . metoprolol tartrate (LOPRESSOR) 25 MG tablet TAKE ONE-HALF (1/2) TABLET AS NEEDED FOR PALPITATIONS  . Multiple Vitamins-Minerals (CENTRUM SILVER PO) Take 1 tablet by mouth daily.   . polyethylene glycol (MIRALAX / GLYCOLAX) packet Take 17 g by mouth daily as needed for mild constipation.   . potassium chloride (KLOR-CON) 20 MEQ packet Take 1 packet by mouth every Monday, Wednesday and Friday  . ranitidine (ZANTAC) 75 MG tablet Take 75 mg by mouth daily.   Marland Kitchen trolamine salicylate (ASPERCREME) 10 % cream Apply 1 application topically 2 (two) times daily as needed.  . vitamin B-12 (CYANOCOBALAMIN) 1000 MCG tablet Take 1,000 mcg by mouth daily.   No facility-administered encounter medications on file as of 02/21/2017.     Allergies (verified) Plavix [clopidogrel bisulfate]   History: Past Medical History:  Diagnosis Date  . Abnormality of gait 02/2010  . Anal and rectal polyp 1980  . Atrial fibrillation ([place]) 11/2009  . Chronic kidney disease, stage III (moderate) 08/07/2013  . Deviated nasal septum 11/2009  . Diaphragmatic hernia without mention of obstruction or  gangrene 2007  . Diarrhea 02/2010  . Dizziness and giddiness 2009  . Edema 2010  . Essential and other specified forms of tremor 2007  . 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 Hyperglycemia 01/02/2013  . Hypertrophy of prostate without urinary obstruction and other lower urinary tract symptoms (LUTS) 2002  . Insomnia with sleep apnea, unspecified 02/2010  . Irritable bowel syndrome 11/14/2011  . Long term (current) use of anticoagulants 11/2009  . Other abnormal blood chemistry 02/09/2009  . Other B-complex deficiencies 2008  . Other dyspnea and respiratory abnormality 11/2009  . Other malaise and fatigue 2007  . Pain in joint, lower leg 02/2010  . Palpitations 2007  . Reflux esophagitis 2007  . Sebaceous cyst 2008  . Tension headache 2009  . Undiagnosed cardiac murmurs 2002  . Unspecified constipation 07/04/2012  . Unspecified essential hypertension 2007  . Unspecified hereditary and idiopathic peripheral neuropathy 02/2010  . Unspecified transient cerebral ischemia 2002  . Urinary frequency 10/01/2012   History reviewed. No pertinent surgical history. Family History  Problem Relation Age of Onset  . Congestive Heart Failure Mother   . Diabetes Father   . Heart disease Father   . Cancer Sister        lung   . Kidney disease Sister    Social History   Occupational History  . retired     Social History Main Topics  . Smoking status: Never Smoker  . Smokeless tobacco: Never  Used  . Alcohol use Yes     Comment: 2oz a week  . Drug use: No  . Sexual activity: No   Tobacco Counseling Counseling given: Not Answered   Activities of Daily Living In your present state of health, do you have any difficulty performing the following activities: 02/21/2017  Hearing? Y  Vision? Y  Difficulty concentrating or making decisions? Y  Walking or climbing stairs? Y  Dressing or bathing? Y  Doing errands, shopping? Y  Preparing Food and eating ? Y  Using the Toilet?  Y  In the past six months, have you accidently leaked urine? Y  Do you have problems with loss of bowel control? Y  Managing your Medications? Y  Managing your Finances? Y  Housekeeping or managing your Housekeeping? Y  Some recent data might be hidden    Immunizations and Health Maintenance Immunization History  Administered Date(s) Administered  . Influenza Inj Mdck Quad Pf 06/23/2016  . Influenza-Unspecified 06/11/2013, 06/23/2014, 06/25/2015  . Pneumococcal Conjugate-13 09/09/2015  . Pneumococcal Polysaccharide-23 09/05/2008  . Td 09/05/1978   Health Maintenance Due  Topic Date Due  . Samul Dada  09/05/1986    Patient Care Team: Gayland Curry, DO as PCP - General (Geriatric Medicine) [facility] Memo, Nicki Reaper, DDS as Consulting Physician (Oral Surgery)  Indicate any recent Medical Services you may have received from other than [facility] providers in the past year (date may be approximate).    Assessment:   This is a routine wellness examination for [place].   Hearing/Vision screen No exam data present  Dietary issues and exercise activities discussed: Current Exercise Habits: The patient does not participate in regular exercise at present, Exercise limited by: None identified  Goals    . Maintain Lifestyle          Pt will maintain lifestyle.       Depression Screen PHQ 2/9 Scores 02/21/2017 10/26/2016 07/13/2016 04/20/2016  PHQ - 2 Score 0 0 0 0    Fall Risk Fall Risk  02/21/2017 10/26/2016 07/13/2016 04/20/2016 04/11/2016  Falls in the past year? Yes No No No No  Number falls in past yr: 2 or more - - - -  Injury with Fall? No - - - -    Cognitive Function: MMSE - Mini Mental State Exam 11/24/2016 05/21/2014  Orientation to time 5 5  Orientation to Place 5 5  Registration 3 3  Attention/ Calculation 5 5  Recall 3 3  Language- name 2 objects 2 2  Language- repeat 1 1  Language- follow 3 step command 3 3  Language- read & follow  direction 1 1  Write a sentence 1 1  Copy design 1 1  Total score 30 30        Screening Tests Health Maintenance  Topic Date Due  . TETANUS/TDAP  09/05/1988  . INFLUENZA VACCINE  04/05/2017  . PNA vac Low Risk Adult  Completed        Plan:    I have personally reviewed and addressed the Medicare Annual Wellness questionnaire and have noted the following in the patient's chart:  A. Medical and social history B. Use of alcohol, tobacco or illicit drugs  C. Current medications and supplements D. Functional ability and status E.  Nutritional status F.  Physical activity G. Advance directives H. List of other physicians I.  Hospitalizations, surgeries, and ER visits in previous 12 months J.  [place] to include hearing, vision, cognitive, depression L.  Referrals and appointments - none  In addition, I have reviewed and discussed with patient certain preventive protocols, quality metrics, and best practice recommendations. A written personalized care plan for preventive services as well as general preventive health recommendations were provided to patient.  See attached scanned questionnaire for additional information.   Signed,   Rich Reining, RN Nurse Health Advisor   Quick Notes   Health Maintenance: TDAP due     Abnormal Screen: MMSE 30/30. Passed clock drawing on 11/24/16     Patient Concerns: Pt said [facility] needs his Dr to call and go over medications since they have been changing so much?     Nurse Concerns: None

## 2017-03-20 ENCOUNTER — Encounter: Payer: Self-pay | Admitting: Internal Medicine

## 2017-03-20 DIAGNOSIS — I10 Essential (primary) hypertension: Secondary | ICD-10-CM | POA: Diagnosis not present

## 2017-03-20 DIAGNOSIS — E039 Hypothyroidism, unspecified: Secondary | ICD-10-CM | POA: Diagnosis not present

## 2017-03-20 DIAGNOSIS — I509 Heart failure, unspecified: Secondary | ICD-10-CM | POA: Diagnosis not present

## 2017-03-20 DIAGNOSIS — D510 Vitamin B12 deficiency anemia due to intrinsic factor deficiency: Secondary | ICD-10-CM | POA: Diagnosis not present

## 2017-03-20 DIAGNOSIS — D649 Anemia, unspecified: Secondary | ICD-10-CM | POA: Diagnosis not present

## 2017-03-20 LAB — BASIC METABOLIC PANEL
BUN: 28 — AB (ref 4–21)
Creatinine: 1.3 (ref 0.6–1.3)
Glucose: 137
Potassium: 4.9 (ref 3.4–5.3)
Sodium: 143 (ref 137–147)

## 2017-03-20 LAB — VITAMIN B12: Vitamin B-12: 1095

## 2017-03-20 LAB — TSH: TSH: 5.05 (ref 0.41–5.90)

## 2017-03-21 ENCOUNTER — Encounter: Payer: Self-pay | Admitting: Internal Medicine

## 2017-03-22 ENCOUNTER — Encounter: Payer: Self-pay | Admitting: Internal Medicine

## 2017-03-22 ENCOUNTER — Non-Acute Institutional Stay: Payer: Medicare Other | Admitting: Internal Medicine

## 2017-03-22 VITALS — BP 120/70 | HR 63 | Temp 98.5°F | Wt 207.0 lb

## 2017-03-22 DIAGNOSIS — N183 Chronic kidney disease, stage 3 unspecified: Secondary | ICD-10-CM

## 2017-03-22 DIAGNOSIS — R413 Other amnesia: Secondary | ICD-10-CM

## 2017-03-22 DIAGNOSIS — M238X1 Other internal derangements of right knee: Secondary | ICD-10-CM

## 2017-03-22 DIAGNOSIS — R350 Frequency of micturition: Secondary | ICD-10-CM

## 2017-03-22 DIAGNOSIS — E039 Hypothyroidism, unspecified: Secondary | ICD-10-CM

## 2017-03-22 DIAGNOSIS — I482 Chronic atrial fibrillation, unspecified: Secondary | ICD-10-CM

## 2017-03-22 DIAGNOSIS — N401 Enlarged prostate with lower urinary tract symptoms: Secondary | ICD-10-CM

## 2017-03-22 DIAGNOSIS — K5901 Slow transit constipation: Secondary | ICD-10-CM | POA: Diagnosis not present

## 2017-03-22 MED ORDER — LEVOTHYROXINE SODIUM 25 MCG PO TABS: 25.0000 ug | ORAL_TABLET | Freq: Every day | ORAL | 3 refills | Status: DC

## 2017-03-22 MED ORDER — DOXAZOSIN MESYLATE 2 MG PO TABS: 2.0000 mg | ORAL_TABLET | ORAL | 3 refills | Status: DC

## 2017-03-22 NOTE — Progress Notes (Signed)
Location:  Occupational psychologist of Service:  Clinic (12)  Provider: Tripton Ned L. Mariea Clonts, D.O., C.M.D.  Code Status: DNR Goals of Care:  Advanced Directives 03/22/2017  Does Patient Have a Medical Advance Directive? Yes  Type of Advance Directive Out of facility DNR (pink MOST or yellow form);[place];Living will  Does patient want to make changes to medical advance directive? -  Copy of [place] in Chart? Yes  Pre-existing out of facility DNR order (yellow form or pink MOST form) Yellow form placed in chart (order not valid for inpatient use)   Chief Complaint  Patient presents with  . Medical Management of Chronic Issues    73mth follow-up    HPI: Patient is a 83 y.o. male seen today for medical management of chronic diseases.    Says he's doing pretty well.   Sometimes not sleeping well.  Takes naps to catch up.  Knees hurt him.  At night almost invariably.  Not during the day.    Right knee continues to buckle at times.  Says he holds onto something.  Turning seems to worsen the buckling risk.  For example, when he washes his face and goes to turn to grab the towel.  Has grab bars and the sink to grab.  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  He asks about it each visit though.  Apparently, he is not faithfully using the walker and the importance of this was again reviewed.    Swelling of legs.  He denies having any.  He notices he swells more if he does not dress early on (socks and shoes).  They are not bothering him now.   Taking the "damn little pill" that keeps him up urinating.  He was up 10 times at night.  He is taking 1.5 tabs on Monday, Wednesday and Friday.  He does not accept that this is necessary to keep him from going into chf and pulmonary edema.  He refuses to wear his compression hose b/c of dignity issues with getting help putting them on in the morning and off at hs.  We've  reviewed this each visit for at least a year.  Hypothyroidism:  tsh elevated.  Turns out he is not taking levothyroxine at all.  He does not have any recollection of ever being on it.  Upon chart review, it was started in rehab and then he went home and then to [place].  When [place] moved [place], his meds were to be administered by staff which was one of the big reasons for the move, but he refused this and insisted upon managing his own meds.  Apparently, the levothyroxine was not ordered through his pharmacy and pt never noticed.  He has not been taking any for at least 3 mos.    Continues to have loose stools after a few days of constipation.  Reports the loose stools are unpredictable.  There is some question about whether they happen after he uses the miralax to treat his constipation, but he says it's random though I'm not certain he really knows.  We discussed wearing depends after he takes miralax just in case, but he says they do not hold in the loose stools if they occur.  Apparently he and aids are throwing out his clothing instead of washing it when he has accidents.  Past Medical History:  Diagnosis Date  . Abnormality of  gait 02/2010  . Anal and rectal polyp 1980  . Atrial fibrillation ([place]) 11/2009  . Chronic kidney disease, stage III (moderate) 08/07/2013  . Deviated nasal septum 11/2009  . Diaphragmatic hernia without mention of obstruction or gangrene 2007  . Diarrhea 02/2010  . Dizziness and giddiness 2009  . Edema 2010  . Essential and other specified forms of tremor 2007  . 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 Hyperglycemia 01/02/2013  . Hypertrophy of prostate without urinary obstruction and other lower urinary tract symptoms (LUTS) 2002  . Insomnia with sleep apnea, unspecified 02/2010  . Irritable bowel syndrome 11/14/2011  . Long term (current) use of anticoagulants 11/2009  . Other abnormal blood chemistry 02/09/2009  . Other B-complex deficiencies 2008  .  Other dyspnea and respiratory abnormality 11/2009  . Other malaise and fatigue 2007  . Pain in joint, lower leg 02/2010  . Palpitations 2007  . Reflux esophagitis 2007  . Sebaceous cyst 2008  . Tension headache 2009  . Undiagnosed cardiac murmurs 2002  . Unspecified constipation 07/04/2012  . Unspecified essential hypertension 2007  . Unspecified hereditary and idiopathic peripheral neuropathy 02/2010  . Unspecified transient cerebral ischemia 2002  . Urinary frequency 10/01/2012    No past surgical history on file.  Allergies  Allergen Reactions  . Plavix [Clopidogrel Bisulfate]     indigestion    Allergies as of 03/22/2017      Reactions   Plavix [clopidogrel Bisulfate]    indigestion      Medication List       Accurate as of 03/22/17 11:22 AM. Always use your most recent med list.          Acetaminophen 500 MG coapsule Take by mouth. Take 2 tablets once daily as needed   CENTRUM SILVER PO Take 1 tablet by mouth daily.   doxazosin 2 MG tablet Commonly known as:  CARDURA Take 1 tablet (2 mg total) by mouth every morning.   furosemide 20 MG tablet Commonly known as:  LASIX Take 1.5 tablets by mouth on Monday, Wednesday and Friday ongoing   levothyroxine 25 MCG tablet Commonly known as:  SYNTHROID, LEVOTHROID Take 37.5 mcg by mouth daily before breakfast.   metoprolol succinate 25 MG 24 hr tablet Commonly known as:  TOPROL-XL Take 25 mg by mouth daily.   metoprolol tartrate 25 MG tablet Commonly known as:  LOPRESSOR TAKE ONE-HALF (1/2) TABLET AS NEEDED FOR PALPITATIONS   polyethylene glycol packet Commonly known as:  MIRALAX / GLYCOLAX Take 17 g by mouth daily as needed for mild constipation.   potassium chloride 20 MEQ packet Commonly known as:  KLOR-CON Take 1 packet by mouth every Monday, Wednesday and Friday   ranitidine 75 MG tablet Commonly known as:  ZANTAC Take 75 mg by mouth daily.   trolamine salicylate 10 % cream Commonly known as:   ASPERCREME Apply 1 application topically 2 (two) times daily as needed.   vitamin B-12 1000 MCG tablet Commonly known as:  CYANOCOBALAMIN Take 1,000 mcg by mouth daily.       Review of Systems:  Review of Systems  Constitutional: Positive for malaise/fatigue. Negative for chills and fever.  HENT: Positive for hearing loss.        Hearing poor even with hearing aids  Eyes: Negative for blurred vision.  Respiratory: Negative for cough and shortness of breath.   Cardiovascular: Positive for leg swelling. Negative for chest pain and palpitations.  Gastrointestinal: Positive for constipation and diarrhea.  Negative for abdominal pain, blood in stool and melena.  Genitourinary: Positive for frequency and urgency. Negative for dysuria, flank pain and hematuria.  Musculoskeletal: Positive for falls and joint pain.  Skin: Negative for itching and rash.  Neurological: Positive for weakness. Negative for dizziness and loss of consciousness.  Endo/Heme/Allergies: Bruises/bleeds easily.  Psychiatric/Behavioral: Positive for memory loss. Negative for depression. The patient is not nervous/anxious and does not have insomnia.     Health Maintenance  Topic Date Due  . TETANUS/TDAP  09/05/1988  . INFLUENZA VACCINE  04/05/2017  . PNA vac Low Risk Adult  Completed    Physical Exam: Vitals:   03/22/17 1055  BP: 120/70  Pulse: 63  Temp: 98.5 F (36.9 C)  TempSrc: Oral  SpO2: 97%  Weight: 207 lb (93.9 kg)   Body mass index is 28.07 kg/m. Physical Exam  Constitutional: He is oriented to person, place, and time. He appears well-developed and well-nourished. No distress.  Cardiovascular: Intact distal pulses.   irreg irreg; chronic 1+ edema of bilateral lower legs and feet (pt no longer notices)  Pulmonary/Chest: Effort normal and breath sounds normal. No respiratory distress.  Abdominal: Soft. Bowel sounds are normal.  Musculoskeletal: Normal range of motion.  Some effusion of right knee;  walks with rollator or uses power scooter  Neurological: He is alert and oriented to person, place, and time.  Skin: Skin is warm and dry. Capillary refill takes less than 2 seconds.  Psychiatric: He has a normal mood and affect.    Labs reviewed: Basic Metabolic Panel:  Recent Labs  10/23/16 1627 11/10/16 03/20/17 0700  NA 140 143 143  K 3.8 3.6 4.9  CL 106  --   --   CO2 26  --   --   GLUCOSE 124*  --   --   BUN 22* 42* 28*  CREATININE 1.49* 1.4* 1.3  CALCIUM 8.7*  --   --   MG 2.1  --   --   TSH  --   --  5.05   Liver Function Tests: No results for input(s): AST, ALT, ALKPHOS, BILITOT, PROT, ALBUMIN in the last 8760 hours. No results for input(s): LIPASE, AMYLASE in the last 8760 hours. No results for input(s): AMMONIA in the last 8760 hours. CBC:  Recent Labs  10/23/16 1627  WBC 5.1  HGB 12.8*  HCT 39.2  MCV 95.1  PLT 120*   Lipid Panel: No results for input(s): CHOL, HDL, LDLCALC, TRIG, CHOLHDL, LDLDIRECT in the last 8760 hours. Lab Results  Component Value Date   HGBA1C 5.3 03/01/2016   Assessment/Plan 1. Acquired hypothyroidism - I had intended to increase his levothyroxine based on his TSH; however, pt did not even have any levothyroxine to take so will simply put him back on the dose he was supposed to be taking - levothyroxine (SYNTHROID, LEVOTHROID) 25 MCG tablet; Take 1 tablet (25 mcg total) by mouth daily before breakfast.  Dispense: 90 tablet; Refill: 3  2. Chronic atrial fibrillation (HCC) -ongoing, did not report any episodes of RVR -cont metoprolol succinate 25mg  po daily and metoprolol tartrate 12.5mg  po daily prn palpitations which he likely never uses b/c he cannot remember when to take it and won't report his symptoms to the staff  3. Chronic kidney disease, stage III (moderate) -Avoid nephrotoxic agents like nsaids, dose adjust renally excreted meds, hydrate.  4. Benign prostatic hyperplasia with urinary frequency -continues with urinary  frequency worse on his lasix days, but this is necessary  for his CHF and especially since he won't wear the compression hose as prescribed  5. Joint laxity of right knee -ongoing, must use walker at all times when out of scooter  6. Slow transit constipation -needs med mgt so someone can actually track the bowel situation also -suspect he's using miralax and then having loose stools with fecal incontinence, but he seems to think the loose stools are random and he's frustrated and throwing out clothes  7.  Memory loss -pt denies any problem here, but medication issue with the levothyroxine clearly indicates a problem with him managing his own medications -seems to be worsening and not helped by his very poor hearing -also, there is continuously confusion about the metoprolol dosing due to his confusion though it's listed quite clearly on his epic med list what dose he should be on, he disagrees with nursing staff and then I get messages about it  Labs/tests ordered:  F/u tsh in 4-6 wks Next appt:  07/05/2017   Donnabelle Blanchard L. Minela [place], D.O. [facility] [address], [place] [address] Cell Phone (Mon-Fri 8am-5pm):  [phone] On Call:  [phone] & follow prompts after 5pm & weekends Office Phone:  [phone] Office Fax:  [phone]

## 2017-04-12 DIAGNOSIS — L570 Actinic keratosis: Secondary | ICD-10-CM | POA: Diagnosis not present

## 2017-04-12 DIAGNOSIS — L821 Other seborrheic keratosis: Secondary | ICD-10-CM | POA: Diagnosis not present

## 2017-04-12 DIAGNOSIS — Z85828 Personal history of other malignant neoplasm of skin: Secondary | ICD-10-CM | POA: Diagnosis not present

## 2017-04-12 DIAGNOSIS — L820 Inflamed seborrheic keratosis: Secondary | ICD-10-CM | POA: Diagnosis not present

## 2017-04-12 DIAGNOSIS — D1801 Hemangioma of skin and subcutaneous tissue: Secondary | ICD-10-CM | POA: Diagnosis not present

## 2017-06-25 LAB — CBC AND DIFFERENTIAL
HCT: 39 — AB (ref 41–53)
HEMOGLOBIN: 12.9 — AB (ref 13.5–17.5)
PLATELETS: 138 — AB (ref 150–399)
WBC: 12.9

## 2017-06-25 LAB — BASIC METABOLIC PANEL
BUN: 32 — AB (ref 4–21)
Creatinine: 1.6 — AB (ref 0.6–1.3)
Glucose: 140
POTASSIUM: 3.8 (ref 3.4–5.3)
Sodium: 137 (ref 137–147)

## 2017-06-26 ENCOUNTER — Non-Acute Institutional Stay (SKILLED_NURSING_FACILITY): Payer: Medicare Other | Admitting: Adult Health

## 2017-06-26 ENCOUNTER — Encounter: Payer: Self-pay | Admitting: Adult Health

## 2017-06-26 DIAGNOSIS — N183 Chronic kidney disease, stage 3 unspecified: Secondary | ICD-10-CM

## 2017-06-26 DIAGNOSIS — I10 Essential (primary) hypertension: Secondary | ICD-10-CM | POA: Diagnosis not present

## 2017-06-26 DIAGNOSIS — I482 Chronic atrial fibrillation, unspecified: Secondary | ICD-10-CM

## 2017-06-26 DIAGNOSIS — K219 Gastro-esophageal reflux disease without esophagitis: Secondary | ICD-10-CM

## 2017-06-26 DIAGNOSIS — R531 Weakness: Secondary | ICD-10-CM

## 2017-06-26 DIAGNOSIS — R338 Other retention of urine: Secondary | ICD-10-CM | POA: Diagnosis not present

## 2017-06-26 DIAGNOSIS — L304 Erythema intertrigo: Secondary | ICD-10-CM

## 2017-06-26 DIAGNOSIS — R3915 Urgency of urination: Secondary | ICD-10-CM | POA: Diagnosis not present

## 2017-06-26 DIAGNOSIS — Z79899 Other long term (current) drug therapy: Secondary | ICD-10-CM | POA: Diagnosis not present

## 2017-06-26 DIAGNOSIS — D649 Anemia, unspecified: Secondary | ICD-10-CM | POA: Diagnosis not present

## 2017-06-26 DIAGNOSIS — R1310 Dysphagia, unspecified: Secondary | ICD-10-CM | POA: Diagnosis not present

## 2017-06-26 DIAGNOSIS — N401 Enlarged prostate with lower urinary tract symptoms: Secondary | ICD-10-CM

## 2017-06-26 DIAGNOSIS — B370 Candidal stomatitis: Secondary | ICD-10-CM | POA: Diagnosis not present

## 2017-06-26 DIAGNOSIS — R319 Hematuria, unspecified: Secondary | ICD-10-CM | POA: Diagnosis not present

## 2017-06-26 DIAGNOSIS — E1165 Type 2 diabetes mellitus with hyperglycemia: Secondary | ICD-10-CM | POA: Diagnosis not present

## 2017-06-26 DIAGNOSIS — D72825 Bandemia: Secondary | ICD-10-CM | POA: Diagnosis not present

## 2017-06-26 DIAGNOSIS — D72829 Elevated white blood cell count, unspecified: Secondary | ICD-10-CM | POA: Diagnosis not present

## 2017-06-26 DIAGNOSIS — N390 Urinary tract infection, site not specified: Secondary | ICD-10-CM | POA: Diagnosis not present

## 2017-06-26 NOTE — Progress Notes (Signed)
Location:  Occupational psychologist of Service:  SNF (31) Provider:   Cindi Carbon, ANP [place] [phone]   Gayland Curry, DO  Patient Care Team: Gayland Curry, DO as PCP - General (Geriatric Medicine) [name], DDS as Consulting Physician (Oral Surgery)  Extended Emergency Contact Information Primary Emergency Contact: Gary [name] [place] Phone: [phone] Relation: Son  Code Status:  DNR Goals of care: Advanced Directive information Advanced Directives 06/26/2017  Does Patient Have a Medical Advance Directive? Yes  Type of Paramedic of [place];Living will  Does patient want to make changes to medical advance directive? No - Patient declined  Copy of [place] in Chart? Yes  Pre-existing out of facility DNR order (yellow form or pink MOST form) -     Chief Complaint  Patient presents with  . Acute Visit    WEAKNESS, DYSURIA    HPI:  Pt is a 83 y.o. male seen for reports of increasing weakness, increased assistance, dysuria/frequency (worse from baseline), and difficulty swallowing. He reports these symptoms have been present for the past week. He also reports poor appetite for the past month.   He lives on [place] and uses a walker for ambulation. He was weaker and needed two people to assist him up and so he was moved to rehab on 10/21.  Due to his urinary symptoms a UA C and S was ordered.  They were trying to get a clean catch but he reported that he could not void and only was having small amts of leakage. A  Specimen was collected with a residual of 300 and therefore the foley was left in place. He had a low grade temp of 100.7 but this reduced to 98 later.  He feels cold but states he always feels this way. He reports that he has been having trouble swallowing and has had periods of choking. He had a swallow study done in  2016 which was normal.   Slight worsening in CKD with BUN 32/Cr 1.6.  Oral fluids were encouraged by staff. WBC 12.9 06/25/17. CXR ordered and pending.  Gary [name] also reports incontinence which has worsened over time. He uses briefs and must manage this on his own since he lives in [place]. He gets up almost every hour to urinate and does not sleep well. This has worsened over time.  He usually takes his own medication and apparently has made some errors so we asked him to let the staff administer his meds in rehab. He also reports an itchy rash to his groin area.   He also reports a burning sensation after he eats meals in the abd area. Currently  He is on zantac. He reports poor appetite for one month. He reported some nausea over the weekend but states it is improved as of this am.  Past Medical History:  Diagnosis Date  . Abnormality of gait 02/2010  . Anal and rectal polyp 1980  . Atrial fibrillation ([place]) 11/2009  . Chronic kidney disease, stage III (moderate) ([place]) 08/07/2013  . Deviated nasal septum 11/2009  . Diaphragmatic hernia without mention of obstruction or gangrene 2007  . Diarrhea 02/2010  . Dizziness and giddiness 2009  . Edema 2010  . Essential and other specified forms of tremor 2007  . 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 Hyperglycemia 01/02/2013  . Hypertrophy of prostate without urinary obstruction and  other lower urinary tract symptoms (LUTS) 2002  . Insomnia with sleep apnea, unspecified 02/2010  . Irritable bowel syndrome 11/14/2011  . Long term (current) use of anticoagulants 11/2009  . Other abnormal blood chemistry 02/09/2009  . Other B-complex deficiencies 2008  . Other dyspnea and respiratory abnormality 11/2009  . Other malaise and fatigue 2007  . Pain in joint, lower leg 02/2010  . Palpitations 2007  . Reflux esophagitis 2007  . Sebaceous cyst 2008  . Tension headache 2009  . Undiagnosed cardiac murmurs 2002  . Unspecified constipation  07/04/2012  . Unspecified essential hypertension 2007  . Unspecified hereditary and idiopathic peripheral neuropathy 02/2010  . Unspecified transient cerebral ischemia 2002  . Urinary frequency 10/01/2012   History reviewed. No pertinent surgical history.  Allergies  Allergen Reactions  . Plavix [Clopidogrel Bisulfate]     indigestion    Outpatient Encounter Prescriptions as of 06/26/2017  Medication Sig  . Acetaminophen 500 MG coapsule Take by mouth. Take 2 tablets once daily as needed  . doxazosin (CARDURA) 2 MG tablet Take 1 tablet (2 mg total) by mouth every morning.  . furosemide (LASIX) 20 MG tablet Take 1.5 tablets by mouth on Monday, Wednesday and Friday ongoing  . levothyroxine (SYNTHROID, LEVOTHROID) 25 MCG tablet Take 1 tablet (25 mcg total) by mouth daily before breakfast.  . metoprolol succinate (TOPROL-XL) 25 MG 24 hr tablet Take 25 mg by mouth daily.   . metoprolol tartrate (LOPRESSOR) 25 MG tablet TAKE ONE-HALF (1/2) TABLET AS NEEDED FOR PALPITATIONS  . Multiple Vitamins-Minerals (CENTRUM SILVER PO) Take 1 tablet by mouth daily.   . polyethylene glycol (MIRALAX / GLYCOLAX) packet Take 17 g by mouth daily as needed for mild constipation.   . potassium chloride (KLOR-CON) 20 MEQ packet Take 1 packet by mouth every Monday, Wednesday and Friday  . ranitidine (ZANTAC) 75 MG tablet Take 75 mg by mouth daily.   Marland Kitchen trolamine salicylate (ASPERCREME) 10 % cream Apply 1 application topically 2 (two) times daily as needed.  . vitamin B-12 (CYANOCOBALAMIN) 1000 MCG tablet Take 1,000 mcg by mouth daily.   No facility-administered encounter medications on file as of 06/26/2017.     Review of Systems  Constitutional: Positive for activity change, appetite change and fatigue. Negative for chills, diaphoresis, fever and unexpected weight change.  HENT: Positive for trouble swallowing. Negative for congestion, postnasal drip, rhinorrhea, sinus pain, sinus pressure and sore throat.     Eyes: Negative for photophobia, pain, discharge, redness, itching and visual disturbance.  Respiratory: Positive for cough (with meals). Negative for shortness of breath and wheezing.   Gastrointestinal: Positive for abdominal pain (burning sensaiton in the upper abd area). Negative for abdominal distention, constipation, diarrhea and nausea.       Reports indigestion  Endocrine: Negative for polyuria.  Genitourinary: Positive for decreased urine volume, difficulty urinating, dysuria, enuresis, frequency and urgency. Negative for discharge, flank pain, genital sores, hematuria, penile pain, penile swelling, scrotal swelling and testicular pain.  Musculoskeletal: Positive for arthralgias and gait problem.  Skin: Positive for rash. Negative for wound.  Neurological: Positive for tremors and weakness. Negative for dizziness, seizures, syncope, facial asymmetry, speech difficulty, light-headedness, numbness and headaches.  Psychiatric/Behavioral: Positive for sleep disturbance. Negative for agitation, behavioral problems, confusion, decreased concentration, dysphoric mood, hallucinations, self-injury and suicidal ideas. The patient is not nervous/anxious and is not hyperactive.     Immunization History  Administered Date(s) Administered  . Influenza Inj Mdck Quad Pf 06/23/2016  . Influenza-Unspecified 06/11/2013, 06/23/2014,  06/25/2015  . Pneumococcal Conjugate-13 09/09/2015  . Pneumococcal Polysaccharide-23 09/05/2008  . Td 09/05/1978  . Tdap 02/23/2017   Pertinent  Health Maintenance Due  Topic Date Due  . INFLUENZA VACCINE  04/05/2017  . PNA vac Low Risk Adult  Completed   Fall Risk  02/21/2017 10/26/2016 07/13/2016 04/20/2016 04/11/2016  Falls in the past year? Yes No No No No  Number falls in past yr: 2 or more - - - -  Injury with Fall? No - - - -   Functional Status Survey:    Vitals:   06/26/17 0945  BP: (!) 148/62  Pulse: 70  Resp: 20  Temp: (!) 100.7 F (38.2 C)  SpO2: 94%    There is no height or weight on file to calculate BMI. Physical Exam  Constitutional: He is oriented to person, place, and time. No distress.  HENT:  Head: Normocephalic and atraumatic.  Nose: Nose normal.  Mouth/Throat: Oropharynx is clear and moist. No oropharyngeal exudate.  White coating to the tongue and small white spots to the back of the throat  Eyes: Pupils are equal, round, and reactive to light. Conjunctivae and EOM are normal. Right eye exhibits no discharge. Left eye exhibits no discharge.  Neck: No JVD present. No thyromegaly present.  Cardiovascular: Normal rate.   No murmur heard. Irregular. BLE edema +1  Pulmonary/Chest: Effort normal and breath sounds normal. No respiratory distress. He has no wheezes.  Abdominal: Soft. Bowel sounds are normal. He exhibits distension (slightly). He exhibits no mass. There is no tenderness. There is no rebound and no guarding.  Genitourinary: Penis normal. No penile tenderness.  Musculoskeletal: He exhibits no edema or tenderness.  Lymphadenopathy:    He has cervical adenopathy.  Neurological: He is alert and oriented to person, place, and time. No cranial nerve deficit.  Skin: Skin is warm and dry. Rash (macular rash to the groin area) noted. He is not diaphoretic.  Psychiatric: He has a normal mood and affect.    Labs reviewed:  Recent Labs  10/23/16 1627 11/10/16 03/20/17 0700 06/25/17  NA 140 143 143 137  K 3.8 3.6 4.9 3.8  CL 106  --   --   --   CO2 26  --   --   --   GLUCOSE 124*  --   --   --   BUN 22* 42* 28* 32*  CREATININE 1.49* 1.4* 1.3 1.6*  CALCIUM 8.7*  --   --   --   MG 2.1  --   --   --    No results for input(s): AST, ALT, ALKPHOS, BILITOT, PROT, ALBUMIN in the last 8760 hours.  Recent Labs  10/23/16 1627 06/25/17  WBC 5.1 12.9  HGB 12.8* 12.9*  HCT 39.2 39*  MCV 95.1  --   PLT 120* 138*   Lab Results  Component Value Date   TSH 5.05 03/20/2017   Lab Results  Component Value Date   HGBA1C  5.3 03/01/2016   Lab Results  Component Value Date   CHOL 119 03/01/2016   HDL 56 03/01/2016   LDLCALC 52 03/01/2016   TRIG 54 03/01/2016    Significant Diagnostic Results in last 30 days:  Dg Chest 2 View  Result Date: 10/23/2016 CLINICAL DATA:  Shortness of breath. EXAM: CHEST  2 VIEW COMPARISON:  04/11/2016 FINDINGS: AP and lateral views of the chest. The cardio pericardial silhouette is enlarged. Interstitial markings are diffusely coarsened with chronic features. Basilar atelectasis bilaterally with  tiny left pleural effusion. Bones are diffusely demineralized. IMPRESSION: Cardiomegaly with underlying chronic interstitial changes. Basilar atelectasis with tiny left pleural effusion. Electronically Signed   By: Misty Stanley M.D.   On: 10/23/2016 17:02    Assessment/Plan  1. Urinary retention due to benign prostatic hyperplasia He was not able to void today, a foley was kept in place. He will most likely need a urology referral when he is able to leave the facility.  He should continue the Cardura for now. He may have an infection which has caused this situation to worsen. His UA is pending at this time.   2. Bandemia UA and CXR pending  3. Gastroesophageal reflux disease without esophagitis D/C Zantac Prilosec 20 mg BID AC BF and dinner  Hopefully this will help with the burning sensation he has in his after meals and nausea  4. Dysphagia, unspecified type Has symptoms of GERD and has oral thrush contributing to this issue. ST eval and tx  5. Oral thrush Nystatin swish and swallow QID for 10 days  6. Intertrigo Nystatin cream to genital area BID x 10 days  7. Weakness He appears weaker on exam and needs two people to help him. I am not sure if he has the appropriate walker and he certainly will need an assessment by PT to see if he can return to [facility] safely. I explained all this to him and he declined PT stating "I don't believe in that stuff".  I educated him on the  importance of having the appropriate walker and appropriate activity program/level of care for safety. We will need to approach him again regarding this issue later this week.  He may be acutely weaker due to urinary retention and possible infection.  8. Chronic atrial fibrillation (HCC) Rate controlled with Toprol 25 mg qd Off anticoagulation due to bleeding  9. Essential hypertension, benign Controlled  10. Chronic kidney disease, stage III (moderate) (HCC) Encourage fluids while on rehab  Continue to periodically monitor BMP and avoid nephrotoxic agent    Family/ staff Communication: discussed with resident/staff  Labs/tests ordered:  UA and CXR pending. Labs complete

## 2017-06-27 ENCOUNTER — Non-Acute Institutional Stay (SKILLED_NURSING_FACILITY): Payer: Medicare Other | Admitting: Internal Medicine

## 2017-06-27 ENCOUNTER — Encounter: Payer: Self-pay | Admitting: Internal Medicine

## 2017-06-27 DIAGNOSIS — I10 Essential (primary) hypertension: Secondary | ICD-10-CM

## 2017-06-27 DIAGNOSIS — N401 Enlarged prostate with lower urinary tract symptoms: Secondary | ICD-10-CM | POA: Diagnosis not present

## 2017-06-27 DIAGNOSIS — R350 Frequency of micturition: Secondary | ICD-10-CM

## 2017-06-27 DIAGNOSIS — K5901 Slow transit constipation: Secondary | ICD-10-CM

## 2017-06-27 DIAGNOSIS — I509 Heart failure, unspecified: Secondary | ICD-10-CM

## 2017-06-27 DIAGNOSIS — F05 Delirium due to known physiological condition: Secondary | ICD-10-CM | POA: Diagnosis not present

## 2017-06-27 DIAGNOSIS — I482 Chronic atrial fibrillation, unspecified: Secondary | ICD-10-CM

## 2017-06-27 DIAGNOSIS — N3000 Acute cystitis without hematuria: Secondary | ICD-10-CM

## 2017-06-27 NOTE — Progress Notes (Signed)
Patient ID: Gary [name], male   DOB: 09-09-1915, 83 y.o.   MRN: [id number]  Provider:  Rexene Edison. Mariea Clonts, D.O., C.M.D. Location:  [facility] Room Number: 756 rehab Place of Service:  SNF (31)  PCP: Gayland Curry, DO Patient Care Team: Gayland Curry, DO as PCP - General (Geriatric Medicine) [facility] [name], DDS as Consulting Physician (Oral Surgery)  Extended Emergency Contact Information Primary Emergency Contact: Lyman Bishop of [place] Phone: [phone] Relation: Son  Code Status: DNR Goals of Care: Advanced Directive information Advanced Directives 06/27/2017  Does Patient Have a Medical Advance Directive? Yes  Type of Advance Directive Out of facility DNR (pink MOST or yellow form);[place];Living will  Does patient want to make changes to medical advance directive? -  Copy of [place] in Chart? Yes  Pre-existing out of facility DNR order (yellow form or pink MOST form) -      Chief Complaint  Patient presents with  . New Admit To SNF    Rehab admission    HPI: Patient is a 83 y.o. male seen today for admission to Rehab. He has had an increase in weakness and swallowing difficulties, also significantly more confused than baseline. Was using a walker but, is now requiring a two person assist. He was previously in [place] and doing well, he was allowed to manage his medication administration himself (though Dr had advised against, pt insisted upon doing this and passed screen) and had meals in the [facility] dining room.There has been some question if he was managing his medications very well due to missing his thyroid medication altogether for at least one month and not using his metoprolol prn for palpitations b/c of forgetting why he had it.   MMSE 30/30 in March 2018, but currently delirious.  He was admitted to Rehab on 10/21.    Constipation-States he has not had a bowel movement in 4 days but is passing gas. Endorses rectal pain.   Dysuria and frequency- U/A collected 10/22 was positive for bacteria, pending culture.   Currently on Cardura 4 mg daily. Foley was placed for Urinary retention.   Poor appetite and fluid intake- Feels like he doesn't want to eat because he is bloated.  Has not been eating or drinking well for a few days in [facility] though.  Swallowing difficulties- Has had some choking spells recently, he is OK with speech therapy evaluating. Says it burns when he swallows at times, Zantac was changed to prilosec BID and nystatin mouthwash ordered QID X 10 days due to thrush when he arrived here in rehab.  Hypothyroid- Previous TSH 5.05 in July, has a lab appointment 10/29 for repeat draw--requested it be done here. On Synthroid 16mcg daily. Should now be taking since he got it from mail order pharmacy.  Atrial Fibrillation- Rate control on metoprolol XL 25 mg daily. Denies any palpitations at this time. Has an order for metoprolol prn if he has palpitations.  Congestive heart failure- Denies shortness of breath or edema. Furosemide 30mg  MWF.  Edema is amazingly gone currently due to dry status.    Hypertension- BP 130/77 within goal on current medication.  Past Medical History:  Diagnosis Date  . Abnormality of gait 02/2010  . Anal and rectal polyp 1980  . Atrial fibrillation ([place]) 11/2009  . Chronic kidney disease, stage III (moderate) ([place]) 08/07/2013  . Deviated nasal septum 11/2009  . Diaphragmatic hernia  without mention of obstruction or gangrene 2007  . Diarrhea 02/2010  . Dizziness and giddiness 2009  . Edema 2010  . Essential and other specified forms of tremor 2007  . 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 Hyperglycemia 01/02/2013  . Hypertrophy of prostate without urinary obstruction and other lower urinary tract symptoms (LUTS) 2002  . Insomnia with sleep apnea,  unspecified 02/2010  . Irritable bowel syndrome 11/14/2011  . Long term (current) use of anticoagulants 11/2009  . Other abnormal blood chemistry 02/09/2009  . Other B-complex deficiencies 2008  . Other dyspnea and respiratory abnormality 11/2009  . Other malaise and fatigue 2007  . Pain in joint, lower leg 02/2010  . Palpitations 2007  . Reflux esophagitis 2007  . Sebaceous cyst 2008  . Tension headache 2009  . Undiagnosed cardiac murmurs 2002  . Unspecified constipation 07/04/2012  . Unspecified essential hypertension 2007  . Unspecified hereditary and idiopathic peripheral neuropathy 02/2010  . Unspecified transient cerebral ischemia 2002  . Urinary frequency 10/01/2012   No past surgical history on file.  reports that he has never smoked. He has never used smokeless tobacco. He reports that he drinks alcohol. He reports that he does not use drugs. Social History   Social History  . Marital status: Married    Spouse name: N/A  . Number of children: N/A  . Years of education: N/A   Occupational History  . retired     Social History Main Topics  . Smoking status: Never Smoker  . Smokeless tobacco: Never Used  . Alcohol use Yes     Comment: 2oz a week  . Drug use: No  . Sexual activity: No   Other Topics Concern  . Not on file   Social History Narrative   Lives at [facility] since 02/1992   Widowed   Living Will, DNR   Retired Futures trader with walking, power wheelchair   Exercise: none    Functional Status Survey:    Family History  Problem Relation Age of Onset  . Congestive Heart Failure Mother   . Diabetes Father   . Heart disease Father   . Cancer Sister        lung   . Kidney disease Sister     Health Maintenance  Topic Date Due  . INFLUENZA VACCINE  04/05/2017  . TETANUS/TDAP  02/24/2027  . PNA vac Low Risk Adult  Completed    Allergies  Allergen Reactions  . Plavix [Clopidogrel Bisulfate]     indigestion    Outpatient Encounter  Prescriptions as of 06/27/2017  Medication Sig  . Acetaminophen 500 MG coapsule Take by mouth. Take 2 tablets once daily as needed  . doxazosin (CARDURA) 2 MG tablet Take 1 tablet (2 mg total) by mouth every morning.  . furosemide (LASIX) 20 MG tablet Take 1.5 tablets by mouth on Monday, Wednesday and Friday ongoing  . levothyroxine (SYNTHROID, LEVOTHROID) 25 MCG tablet Take 1 tablet (25 mcg total) by mouth daily before breakfast.  . metoprolol succinate (TOPROL-XL) 25 MG 24 hr tablet Take 25 mg by mouth daily.   . metoprolol tartrate (LOPRESSOR) 25 MG tablet TAKE ONE-HALF (1/2) TABLET AS NEEDED FOR PALPITATIONS  . Multiple Vitamins-Minerals (CENTRUM SILVER PO) Take 1 tablet by mouth daily.   Marland Kitchen nystatin (MYCOSTATIN) 100000 UNIT/ML suspension Take 5 mLs by mouth 4 (four) times daily.  Marland Kitchen nystatin cream (MYCOSTATIN) Apply a thin layer to gential area 2 times daily x  10 days.  Marland Kitchen omeprazole (PRILOSEC OTC) 20 MG tablet Take 20 mg by mouth daily.  . ondansetron (ZOFRAN) 4 MG tablet Take 4 mg by mouth every 6 (six) hours as needed for nausea or vomiting.  . potassium chloride (KLOR-CON) 20 MEQ packet Take 1 packet by mouth every Monday, Wednesday and Friday  . trolamine salicylate (ASPERCREME) 10 % cream Apply 1 application topically 2 (two) times daily as needed.  . vitamin B-12 (CYANOCOBALAMIN) 1000 MCG tablet Take 1,000 mcg by mouth daily.  . [DISCONTINUED] polyethylene glycol (MIRALAX / GLYCOLAX) packet Take 17 g by mouth daily as needed for mild constipation.   . [DISCONTINUED] ranitidine (ZANTAC) 75 MG tablet Take 75 mg by mouth daily.    No facility-administered encounter medications on file as of 06/27/2017.     Review of Systems  Constitutional: Positive for activity change and fever.  HENT: Positive for hearing loss and trouble swallowing.        Hearing aids  Eyes: Negative for visual disturbance.  Respiratory: Positive for choking. Negative for apnea, shortness of breath, wheezing and  stridor.   Cardiovascular: Negative for chest pain and palpitations.  Gastrointestinal: Positive for abdominal distention and constipation.  Endocrine: Negative for heat intolerance.  Genitourinary: Positive for dysuria and frequency.       Urinary retention, now with foley in place  Musculoskeletal: Positive for arthralgias and gait problem.  Skin: Negative for color change.  Neurological: Positive for weakness. Negative for dizziness, syncope and speech difficulty.  Psychiatric/Behavioral: Positive for confusion and sleep disturbance. Negative for agitation and behavioral problems.    Vitals:   06/27/17 1013  BP: 130/77  Pulse: 83  Resp: 19  Temp: 98.1 F (36.7 C)  TempSrc: Oral  SpO2: 94%  Weight: 202 lb (91.6 kg)   Body mass index is 27.4 kg/m. Physical Exam  Constitutional: He is oriented to person, place, and time. He appears well-developed.  HENT:  Head: Normocephalic.  Right Ear: Decreased hearing is noted.  Left Ear: Decreased hearing is noted.  Eyes: Pupils are equal, round, and reactive to light. EOM are normal.  Neck: Neck supple. No JVD present.  Cardiovascular: Normal heart sounds and normal pulses.  An irregularly irregular Gary present.  No edema at present; skin tenting and decreased cap refill notable  Pulmonary/Chest: Effort normal and breath sounds normal.  Abdominal: Bowel sounds are normal. He exhibits distension. There is no tenderness.  Musculoskeletal: Normal range of motion.  Currently too weak to ambulate, requiring two people for transfers; previously using walker reluctantly and power chair for mobility  Lymphadenopathy:    He has no cervical adenopathy.  Neurological: He is alert and oriented to person, place, and time.  But poor historian at times  Skin: Skin is warm and dry.  Psychiatric: He has a normal mood and affect. His speech is normal and behavior is normal. Judgment and thought content normal. He exhibits normal recent memory.     Labs reviewed: Basic Metabolic Panel:  Recent Labs  10/23/16 1627 11/10/16 03/20/17 0700 06/25/17  NA 140 143 143 137  K 3.8 3.6 4.9 3.8  CL 106  --   --   --   CO2 26  --   --   --   GLUCOSE 124*  --   --   --   BUN 22* 42* 28* 32*  CREATININE 1.49* 1.4* 1.3 1.6*  CALCIUM 8.7*  --   --   --   MG 2.1  --   --   --  Liver Function Tests: No results for input(s): AST, ALT, ALKPHOS, BILITOT, PROT, ALBUMIN in the last 8760 hours. No results for input(s): LIPASE, AMYLASE in the last 8760 hours. No results for input(s): AMMONIA in the last 8760 hours. CBC:  Recent Labs  10/23/16 1627 06/25/17  WBC 5.1 12.9  HGB 12.8* 12.9*  HCT 39.2 39*  MCV 95.1  --   PLT 120* 138*   Cardiac Enzymes: No results for input(s): CKTOTAL, CKMB, CKMBINDEX, TROPONINI in the last 8760 hours. BNP: Invalid input(s): POCBNP Lab Results  Component Value Date   HGBA1C 5.3 03/01/2016   Lab Results  Component Value Date   TSH 5.05 03/20/2017   Lab Results  Component Value Date   VITAMINB12 1,095 03/20/2017   No results found for: FOLATE No results found for: IRON, TIBC, FERRITIN  Imaging and Procedures obtained prior to SNF admission: Dg Chest 2 View  Result Date: 10/23/2016 CLINICAL DATA:  Shortness of breath. EXAM: CHEST  2 VIEW COMPARISON:  04/11/2016 FINDINGS: AP and lateral views of the chest. The cardio pericardial silhouette is enlarged. Interstitial markings are diffusely coarsened with chronic features. Basilar atelectasis bilaterally with tiny left pleural effusion. Bones are diffusely demineralized. IMPRESSION: Cardiomegaly with underlying chronic interstitial changes. Basilar atelectasis with tiny left pleural effusion. Electronically Signed   By: Misty Stanley M.D.   On: 10/23/2016 17:02    Assessment/Plan 1. Chronic atrial fibrillation (HCC) Not having palpitations at this time,HR 83 today, continue  rate control with Toprol XL.   2. Essential hypertension,  benign Continue Toprol XL, at goal for his age and function  3. Chronic congestive heart failure, unspecified heart failure type (HCC) Continue Lasix & Toprol XL, if hydration does not improve, hold lasix as he's quite dry at this point  4. Benign prostatic hyperplasia with urinary frequency Continue Cardura, Foley placed for rentention, Continue to evaluate need for Foley, Urology consult when able and willing (tends to refuse most things)  5. Slow transit constipation Staff to utilize standing orders for suppository. Gentle hydration recommended.    6. UTI Sensitivities pending, has >100K gr neg rods, will prescribe antibiotics when returns as afebrile, stable vitals and no abd pain.  Clearly does have UTI. Likely due tin part to episodes of loose stool after miralax use  7. Dysphagia Swallow evaluation by ST pending  8.  Acute hypoactive delirium -due to UTI, poor fluid intake and diuretic use plus has baseline cognitive impairment -should improve with hydration and tx of infection; maintain proper sleep wake cycle, intake, frequent reorientation, early mobility  9.  MCI -baseline memory loss, but mild, just enough to interfere with medication mgt, but was otherwise functioning quite well before this insult  Family/ staff Communication: discussed with rehab nursing, also called patient's son and updated him on situation  Labs/tests ordered:  F/u bmp, d/c foley after 7 days and reassess ability to urinate on his own, recommend he have medication administration by nursing if/when he returns to [facility] level of care, also recommend PT to help him get moving, but he's currently refusing  Treniece Holsclaw L. Sonal Dorwart, D.O. [facility] [address], [place] [address] Cell Phone (Mon-Fri 8am-5pm):  [phone] On Call:  [phone] & follow prompts after 5pm & weekends Office Phone:  [phone] Office Fax:  [phone]

## 2017-06-28 DIAGNOSIS — R1312 Dysphagia, oropharyngeal phase: Secondary | ICD-10-CM | POA: Diagnosis not present

## 2017-06-29 DIAGNOSIS — R1312 Dysphagia, oropharyngeal phase: Secondary | ICD-10-CM | POA: Diagnosis not present

## 2017-06-29 DIAGNOSIS — Z23 Encounter for immunization: Secondary | ICD-10-CM | POA: Diagnosis not present

## 2017-06-30 DIAGNOSIS — R1312 Dysphagia, oropharyngeal phase: Secondary | ICD-10-CM | POA: Diagnosis not present

## 2017-07-03 DIAGNOSIS — R2681 Unsteadiness on feet: Secondary | ICD-10-CM | POA: Diagnosis not present

## 2017-07-03 DIAGNOSIS — M6259 Muscle wasting and atrophy, not elsewhere classified, multiple sites: Secondary | ICD-10-CM | POA: Diagnosis not present

## 2017-07-03 DIAGNOSIS — E039 Hypothyroidism, unspecified: Secondary | ICD-10-CM | POA: Diagnosis not present

## 2017-07-03 DIAGNOSIS — R278 Other lack of coordination: Secondary | ICD-10-CM | POA: Diagnosis not present

## 2017-07-03 DIAGNOSIS — N390 Urinary tract infection, site not specified: Secondary | ICD-10-CM | POA: Diagnosis not present

## 2017-07-03 DIAGNOSIS — R2689 Other abnormalities of gait and mobility: Secondary | ICD-10-CM | POA: Diagnosis not present

## 2017-07-03 DIAGNOSIS — R531 Weakness: Secondary | ICD-10-CM | POA: Diagnosis not present

## 2017-07-03 DIAGNOSIS — E210 Primary hyperparathyroidism: Secondary | ICD-10-CM | POA: Diagnosis not present

## 2017-07-03 DIAGNOSIS — N398 Other specified disorders of urinary system: Secondary | ICD-10-CM | POA: Diagnosis not present

## 2017-07-03 DIAGNOSIS — R1312 Dysphagia, oropharyngeal phase: Secondary | ICD-10-CM | POA: Diagnosis not present

## 2017-07-03 DIAGNOSIS — N189 Chronic kidney disease, unspecified: Secondary | ICD-10-CM | POA: Diagnosis not present

## 2017-07-03 DIAGNOSIS — N3946 Mixed incontinence: Secondary | ICD-10-CM | POA: Diagnosis not present

## 2017-07-03 DIAGNOSIS — D649 Anemia, unspecified: Secondary | ICD-10-CM | POA: Diagnosis not present

## 2017-07-03 LAB — BASIC METABOLIC PANEL
BUN: 39 — AB (ref 4–21)
CREATININE: 1.6 — AB (ref 0.6–1.3)
GLUCOSE: 114
POTASSIUM: 3.8 (ref 3.4–5.3)
Sodium: 139 (ref 137–147)

## 2017-07-03 LAB — TSH: TSH: 2.59 (ref <=5.90)

## 2017-07-04 DIAGNOSIS — M6259 Muscle wasting and atrophy, not elsewhere classified, multiple sites: Secondary | ICD-10-CM | POA: Diagnosis not present

## 2017-07-04 DIAGNOSIS — R2689 Other abnormalities of gait and mobility: Secondary | ICD-10-CM | POA: Diagnosis not present

## 2017-07-04 DIAGNOSIS — R1312 Dysphagia, oropharyngeal phase: Secondary | ICD-10-CM | POA: Diagnosis not present

## 2017-07-04 DIAGNOSIS — N390 Urinary tract infection, site not specified: Secondary | ICD-10-CM | POA: Diagnosis not present

## 2017-07-04 DIAGNOSIS — N3946 Mixed incontinence: Secondary | ICD-10-CM | POA: Diagnosis not present

## 2017-07-04 DIAGNOSIS — R278 Other lack of coordination: Secondary | ICD-10-CM | POA: Diagnosis not present

## 2017-07-05 ENCOUNTER — Encounter: Payer: Medicare Other | Admitting: Internal Medicine

## 2017-07-05 DIAGNOSIS — R2689 Other abnormalities of gait and mobility: Secondary | ICD-10-CM | POA: Diagnosis not present

## 2017-07-05 DIAGNOSIS — R1312 Dysphagia, oropharyngeal phase: Secondary | ICD-10-CM | POA: Diagnosis not present

## 2017-07-05 DIAGNOSIS — N3946 Mixed incontinence: Secondary | ICD-10-CM | POA: Diagnosis not present

## 2017-07-05 DIAGNOSIS — R278 Other lack of coordination: Secondary | ICD-10-CM | POA: Diagnosis not present

## 2017-07-05 DIAGNOSIS — N390 Urinary tract infection, site not specified: Secondary | ICD-10-CM | POA: Diagnosis not present

## 2017-07-05 DIAGNOSIS — M6259 Muscle wasting and atrophy, not elsewhere classified, multiple sites: Secondary | ICD-10-CM | POA: Diagnosis not present

## 2017-07-06 DIAGNOSIS — R278 Other lack of coordination: Secondary | ICD-10-CM | POA: Diagnosis not present

## 2017-07-06 DIAGNOSIS — R531 Weakness: Secondary | ICD-10-CM | POA: Diagnosis not present

## 2017-07-06 DIAGNOSIS — N390 Urinary tract infection, site not specified: Secondary | ICD-10-CM | POA: Diagnosis not present

## 2017-07-06 DIAGNOSIS — N398 Other specified disorders of urinary system: Secondary | ICD-10-CM | POA: Diagnosis not present

## 2017-07-06 DIAGNOSIS — R2681 Unsteadiness on feet: Secondary | ICD-10-CM | POA: Diagnosis not present

## 2017-07-06 DIAGNOSIS — N3946 Mixed incontinence: Secondary | ICD-10-CM | POA: Diagnosis not present

## 2017-07-06 DIAGNOSIS — M6259 Muscle wasting and atrophy, not elsewhere classified, multiple sites: Secondary | ICD-10-CM | POA: Diagnosis not present

## 2017-07-06 DIAGNOSIS — R2689 Other abnormalities of gait and mobility: Secondary | ICD-10-CM | POA: Diagnosis not present

## 2017-07-06 DIAGNOSIS — R1312 Dysphagia, oropharyngeal phase: Secondary | ICD-10-CM | POA: Diagnosis not present

## 2017-07-07 DIAGNOSIS — M6259 Muscle wasting and atrophy, not elsewhere classified, multiple sites: Secondary | ICD-10-CM | POA: Diagnosis not present

## 2017-07-07 DIAGNOSIS — N3946 Mixed incontinence: Secondary | ICD-10-CM | POA: Diagnosis not present

## 2017-07-07 DIAGNOSIS — R278 Other lack of coordination: Secondary | ICD-10-CM | POA: Diagnosis not present

## 2017-07-07 DIAGNOSIS — N390 Urinary tract infection, site not specified: Secondary | ICD-10-CM | POA: Diagnosis not present

## 2017-07-07 DIAGNOSIS — R2689 Other abnormalities of gait and mobility: Secondary | ICD-10-CM | POA: Diagnosis not present

## 2017-07-07 DIAGNOSIS — R1312 Dysphagia, oropharyngeal phase: Secondary | ICD-10-CM | POA: Diagnosis not present

## 2017-07-10 DIAGNOSIS — R2689 Other abnormalities of gait and mobility: Secondary | ICD-10-CM | POA: Diagnosis not present

## 2017-07-10 DIAGNOSIS — M6259 Muscle wasting and atrophy, not elsewhere classified, multiple sites: Secondary | ICD-10-CM | POA: Diagnosis not present

## 2017-07-10 DIAGNOSIS — R1312 Dysphagia, oropharyngeal phase: Secondary | ICD-10-CM | POA: Diagnosis not present

## 2017-07-10 DIAGNOSIS — N3946 Mixed incontinence: Secondary | ICD-10-CM | POA: Diagnosis not present

## 2017-07-10 DIAGNOSIS — N390 Urinary tract infection, site not specified: Secondary | ICD-10-CM | POA: Diagnosis not present

## 2017-07-10 DIAGNOSIS — R278 Other lack of coordination: Secondary | ICD-10-CM | POA: Diagnosis not present

## 2017-07-11 DIAGNOSIS — M6259 Muscle wasting and atrophy, not elsewhere classified, multiple sites: Secondary | ICD-10-CM | POA: Diagnosis not present

## 2017-07-11 DIAGNOSIS — R278 Other lack of coordination: Secondary | ICD-10-CM | POA: Diagnosis not present

## 2017-07-11 DIAGNOSIS — N390 Urinary tract infection, site not specified: Secondary | ICD-10-CM | POA: Diagnosis not present

## 2017-07-11 DIAGNOSIS — R2689 Other abnormalities of gait and mobility: Secondary | ICD-10-CM | POA: Diagnosis not present

## 2017-07-11 DIAGNOSIS — R1312 Dysphagia, oropharyngeal phase: Secondary | ICD-10-CM | POA: Diagnosis not present

## 2017-07-11 DIAGNOSIS — N3946 Mixed incontinence: Secondary | ICD-10-CM | POA: Diagnosis not present

## 2017-07-12 DIAGNOSIS — N3946 Mixed incontinence: Secondary | ICD-10-CM | POA: Diagnosis not present

## 2017-07-12 DIAGNOSIS — R2689 Other abnormalities of gait and mobility: Secondary | ICD-10-CM | POA: Diagnosis not present

## 2017-07-12 DIAGNOSIS — N390 Urinary tract infection, site not specified: Secondary | ICD-10-CM | POA: Diagnosis not present

## 2017-07-12 DIAGNOSIS — M6259 Muscle wasting and atrophy, not elsewhere classified, multiple sites: Secondary | ICD-10-CM | POA: Diagnosis not present

## 2017-07-12 DIAGNOSIS — R1312 Dysphagia, oropharyngeal phase: Secondary | ICD-10-CM | POA: Diagnosis not present

## 2017-07-12 DIAGNOSIS — R278 Other lack of coordination: Secondary | ICD-10-CM | POA: Diagnosis not present

## 2017-07-13 DIAGNOSIS — R2689 Other abnormalities of gait and mobility: Secondary | ICD-10-CM | POA: Diagnosis not present

## 2017-07-13 DIAGNOSIS — M6259 Muscle wasting and atrophy, not elsewhere classified, multiple sites: Secondary | ICD-10-CM | POA: Diagnosis not present

## 2017-07-13 DIAGNOSIS — R1312 Dysphagia, oropharyngeal phase: Secondary | ICD-10-CM | POA: Diagnosis not present

## 2017-07-13 DIAGNOSIS — N3946 Mixed incontinence: Secondary | ICD-10-CM | POA: Diagnosis not present

## 2017-07-13 DIAGNOSIS — R278 Other lack of coordination: Secondary | ICD-10-CM | POA: Diagnosis not present

## 2017-07-13 DIAGNOSIS — N390 Urinary tract infection, site not specified: Secondary | ICD-10-CM | POA: Diagnosis not present

## 2017-07-14 DIAGNOSIS — R278 Other lack of coordination: Secondary | ICD-10-CM | POA: Diagnosis not present

## 2017-07-14 DIAGNOSIS — R2689 Other abnormalities of gait and mobility: Secondary | ICD-10-CM | POA: Diagnosis not present

## 2017-07-14 DIAGNOSIS — N390 Urinary tract infection, site not specified: Secondary | ICD-10-CM | POA: Diagnosis not present

## 2017-07-14 DIAGNOSIS — R1312 Dysphagia, oropharyngeal phase: Secondary | ICD-10-CM | POA: Diagnosis not present

## 2017-07-14 DIAGNOSIS — M6259 Muscle wasting and atrophy, not elsewhere classified, multiple sites: Secondary | ICD-10-CM | POA: Diagnosis not present

## 2017-07-14 DIAGNOSIS — N3946 Mixed incontinence: Secondary | ICD-10-CM | POA: Diagnosis not present

## 2017-07-17 ENCOUNTER — Encounter: Payer: Self-pay | Admitting: Adult Health

## 2017-07-17 ENCOUNTER — Non-Acute Institutional Stay (SKILLED_NURSING_FACILITY): Payer: Medicare Other | Admitting: Adult Health

## 2017-07-17 DIAGNOSIS — L989 Disorder of the skin and subcutaneous tissue, unspecified: Secondary | ICD-10-CM

## 2017-07-17 DIAGNOSIS — R634 Abnormal weight loss: Secondary | ICD-10-CM

## 2017-07-17 DIAGNOSIS — K5901 Slow transit constipation: Secondary | ICD-10-CM

## 2017-07-17 DIAGNOSIS — E039 Hypothyroidism, unspecified: Secondary | ICD-10-CM | POA: Diagnosis not present

## 2017-07-17 DIAGNOSIS — I482 Chronic atrial fibrillation, unspecified: Secondary | ICD-10-CM

## 2017-07-17 DIAGNOSIS — I509 Heart failure, unspecified: Secondary | ICD-10-CM

## 2017-07-17 DIAGNOSIS — R1310 Dysphagia, unspecified: Secondary | ICD-10-CM | POA: Diagnosis not present

## 2017-07-17 DIAGNOSIS — R350 Frequency of micturition: Secondary | ICD-10-CM | POA: Diagnosis not present

## 2017-07-17 DIAGNOSIS — R531 Weakness: Secondary | ICD-10-CM | POA: Diagnosis not present

## 2017-07-17 DIAGNOSIS — I10 Essential (primary) hypertension: Secondary | ICD-10-CM | POA: Diagnosis not present

## 2017-07-17 DIAGNOSIS — R278 Other lack of coordination: Secondary | ICD-10-CM | POA: Diagnosis not present

## 2017-07-17 DIAGNOSIS — N390 Urinary tract infection, site not specified: Secondary | ICD-10-CM | POA: Diagnosis not present

## 2017-07-17 DIAGNOSIS — N3945 Continuous leakage: Secondary | ICD-10-CM

## 2017-07-17 DIAGNOSIS — N401 Enlarged prostate with lower urinary tract symptoms: Secondary | ICD-10-CM | POA: Diagnosis not present

## 2017-07-17 DIAGNOSIS — N3946 Mixed incontinence: Secondary | ICD-10-CM | POA: Diagnosis not present

## 2017-07-17 DIAGNOSIS — M6259 Muscle wasting and atrophy, not elsewhere classified, multiple sites: Secondary | ICD-10-CM | POA: Diagnosis not present

## 2017-07-17 DIAGNOSIS — R2689 Other abnormalities of gait and mobility: Secondary | ICD-10-CM | POA: Diagnosis not present

## 2017-07-17 DIAGNOSIS — R1312 Dysphagia, oropharyngeal phase: Secondary | ICD-10-CM | POA: Diagnosis not present

## 2017-07-17 NOTE — Progress Notes (Signed)
Location:  [facility] of Service:  SNF (31)  Provider:  Cindi Carbon, ANP [place] [phone]   PCP: Gayland Curry, DO Patient Care Team: Gayland Curry, DO as PCP - General (Geriatric Medicine) [facility] [name], DDS as Consulting Physician (Oral Surgery)  Extended Emergency Contact Information Primary Emergency Contact: Lyman Bishop of [place] Phone: [phone] Relation: Son  Code Status: DNR Goals of care:  Advanced Directive information Advanced Directives 06/27/2017  Does Patient Have a Medical Advance Directive? Yes  Type of Advance Directive Out of facility DNR (pink MOST or yellow form);[facility];Living will  Does patient want to make changes to medical advance directive? -  Copy of [place] in Chart? Yes  Pre-existing out of facility DNR order (yellow form or pink MOST form) -     Allergies  Allergen Reactions  . Plavix [Clopidogrel Bisulfate]     indigestion    Chief Complaint  Patient presents with  . Acute Visit    skin lesion, f/u indigestion and weight loss  . Discharge Note    HPI:  83 y.o. male  Currently residing in rehab due to weakness and UTI. He previously lived in [place] and required additional assistance which facilitated the move on 06/26/17.  He was treated for a UTI with Cipro for 7 days. He required a foley for one week.  He reports that the urinary retention has resolved and he is voiding on his own. He continues with periods of incontinence of urine and stool which is worse over time per the staff.   He is walking with therapy (with a walker) and gaining strength.   Dysphagia: doing well on D3 diet with NTL. He denies issues with choking and coughing which was previously a problem He does not want to continue on thickened liquid due to quality of life issues  Uses flutter valve and breather which  has helped with chronic congestion   Weight loss: reports poor appetite and fills up easily with glucerna Wt Readings from Last 3 Encounters:  07/17/17 183 lb 12.8 oz (83.4 kg)  06/27/17 202 lb (91.6 kg)  03/22/17 207 lb (93.9 kg)   GERD: he reports his symptoms of burning in the abd have resolved with the use of prilosec  CHF: no sob, no edema weight trending down Continues on lasix 3 x week  He also has a skin lesion to his left buttock that is irritating and burning when he sits on it. It does not itch.     Past Medical History:  Diagnosis Date  . Abnormality of gait 02/2010  . Anal and rectal polyp 1980  . Atrial fibrillation ([place]) 11/2009  . Chronic kidney disease, stage III (moderate) ([place]) 08/07/2013  . Deviated nasal septum 11/2009  . Diaphragmatic hernia without mention of obstruction or gangrene 2007  . Diarrhea 02/2010  . Dizziness and giddiness 2009  . Edema 2010  . Essential and other specified forms of tremor 2007  . 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 Hyperglycemia 01/02/2013  . Hypertrophy of prostate without urinary obstruction and other lower urinary tract symptoms (LUTS) 2002  . Insomnia with sleep apnea, unspecified 02/2010  . Irritable bowel syndrome 11/14/2011  . Long term (current) use of anticoagulants 11/2009  . Other abnormal blood chemistry 02/09/2009  . Other B-complex deficiencies 2008  . Other dyspnea and respiratory abnormality 11/2009  . Other  malaise and fatigue 2007  . Pain in joint, lower leg 02/2010  . Palpitations 2007  . Reflux esophagitis 2007  . Sebaceous cyst 2008  . Tension headache 2009  . Undiagnosed cardiac murmurs 2002  . Unspecified constipation 07/04/2012  . Unspecified essential hypertension 2007  . Unspecified hereditary and idiopathic peripheral neuropathy 02/2010  . Unspecified transient cerebral ischemia 2002  . Urinary frequency 10/01/2012    History reviewed. No pertinent surgical history.     reports that  has never smoked. he has never used smokeless tobacco. He reports that he drinks alcohol. He reports that he does not use drugs. Social History   Socioeconomic History  . Marital status: Married    Spouse name: Not on file  . Number of children: Not on file  . Years of education: Not on file  . Highest education level: Not on file  Social Needs  . Financial resource strain: Not on file  . Food insecurity - worry: Not on file  . Food insecurity - inability: Not on file  . Transportation needs - medical: Not on file  . Transportation needs - non-medical: Not on file  Occupational History  . Occupation: retired   Tobacco Use  . Smoking status: Never Smoker  . Smokeless tobacco: Never Used  Substance and Sexual Activity  . Alcohol use: Yes    Comment: 2oz a week  . Drug use: No  . Sexual activity: No  Other Topics Concern  . Not on file  Social History Narrative   Lives at [facility] since 02/1992   Widowed   Living Will, DNR   Retired Futures trader with walking, power wheelchair   Exercise: none   Functional Status Survey:    Allergies  Allergen Reactions  . Plavix [Clopidogrel Bisulfate]     indigestion    Pertinent  Health Maintenance Due  Topic Date Due  . INFLUENZA VACCINE  04/05/2017  . PNA vac Low Risk Adult  Completed    Medications: Outpatient Encounter Medications as of 07/17/2017  Medication Sig  . Acetaminophen 500 MG coapsule Take by mouth. Take 2 tablets once daily as needed  . antiseptic oral rinse (BIOTENE) LIQD 15 mLs 4 (four) times daily by Mouth Rinse route.  . doxazosin (CARDURA) 2 MG tablet Take 1 tablet (2 mg total) by mouth every morning.  . furosemide (LASIX) 20 MG tablet Take 1.5 tablets by mouth on Monday, Wednesday and Friday ongoing  . GLUCERNA (GLUCERNA) LIQD Take 237 mLs 2 (two) times daily between meals by mouth.  . levothyroxine (SYNTHROID, LEVOTHROID) 25 MCG tablet Take 1 tablet (25 mcg total) by mouth daily before  breakfast.  . metoprolol succinate (TOPROL-XL) 25 MG 24 hr tablet Take 25 mg by mouth daily.   . metoprolol tartrate (LOPRESSOR) 25 MG tablet TAKE ONE-HALF (1/2) TABLET AS NEEDED FOR PALPITATIONS  . omeprazole (PRILOSEC OTC) 20 MG tablet Take 20 mg 2 (two) times daily by mouth.   . ondansetron (ZOFRAN) 4 MG tablet Take 4 mg by mouth every 6 (six) hours as needed for nausea or vomiting.  . potassium chloride (KLOR-CON) 20 MEQ packet Take 1 packet by mouth every Monday, Wednesday and Friday  . trolamine salicylate (ASPERCREME) 10 % cream Apply 1 application topically 2 (two) times daily as needed.  . vitamin B-12 (CYANOCOBALAMIN) 1000 MCG tablet Take 1,000 mcg by mouth daily.  . [DISCONTINUED] Multiple Vitamins-Minerals (CENTRUM SILVER PO) Take 1 tablet by mouth daily.    No  facility-administered encounter medications on file as of 07/17/2017.     Review of Systems  Constitutional: Positive for appetite change. Negative for activity change, chills, diaphoresis, fatigue, fever and unexpected weight change.  HENT: Positive for trouble swallowing. Negative for congestion.   Respiratory: Positive for cough (improving). Negative for shortness of breath, wheezing and stridor.   Cardiovascular: Negative for chest pain, palpitations and leg swelling.  Gastrointestinal: Negative for abdominal distention, abdominal pain, constipation and diarrhea.  Genitourinary: Negative for difficulty urinating and dysuria.       Incontinence  Musculoskeletal: Positive for gait problem. Negative for arthralgias, back pain, joint swelling and myalgias.  Neurological: Negative for dizziness, seizures, syncope, facial asymmetry, speech difficulty, weakness and headaches.  Hematological: Negative for adenopathy. Does not bruise/bleed easily.  Psychiatric/Behavioral: Negative for agitation, behavioral problems and confusion.    Vitals:   07/17/17 1414  BP: 115/66  Pulse: 71  Resp: 20  Temp: (!) 96.6 F (35.9 C)    SpO2: 95%  Weight: 183 lb 12.8 oz (83.4 kg)   Body mass index is 24.93 kg/m. Physical Exam  Constitutional: He is oriented to person, place, and time. No distress.  HENT:  Head: Normocephalic and atraumatic.  Mouth/Throat: No oropharyngeal exudate.  Neck: Normal range of motion. Neck supple. No JVD present. No tracheal deviation present. No thyromegaly present.  Cardiovascular: Normal rate and regular rhythm.  No murmur heard. Pulmonary/Chest: Effort normal and breath sounds normal. No respiratory distress. He has no wheezes.  Abdominal: Soft. Bowel sounds are normal. He exhibits no distension. There is no tenderness.  Lymphadenopathy:    He has no cervical adenopathy.  Neurological: He is alert and oriented to person, place, and time. No cranial nerve deficit.  Skin: Skin is warm and dry. He is not diaphoretic.  Left buttock with hyperkeratotic skin lesion with mild erythema. Not friable. No drainage or tenderness. Left auricle of the ear with pearly gray skin lesion  Psychiatric: He has a normal mood and affect.  Vitals reviewed.   Labs reviewed: Basic Metabolic Panel: Recent Labs    10/23/16 1627  03/20/17 0700 06/25/17 07/03/17  NA 140   < > 143 137 139  K 3.8   < > 4.9 3.8 3.8  CL 106  --   --   --   --   CO2 26  --   --   --   --   GLUCOSE 124*  --   --   --   --   BUN 22*   < > 28* 32* 39*  CREATININE 1.49*   < > 1.3 1.6* 1.6*  CALCIUM 8.7*  --   --   --   --   MG 2.1  --   --   --   --    < > = values in this interval not displayed.   Liver Function Tests: No results for input(s): AST, ALT, ALKPHOS, BILITOT, PROT, ALBUMIN in the last 8760 hours. No results for input(s): LIPASE, AMYLASE in the last 8760 hours. No results for input(s): AMMONIA in the last 8760 hours. CBC: Recent Labs    10/23/16 1627 06/25/17  WBC 5.1 12.9  HGB 12.8* 12.9*  HCT 39.2 39*  MCV 95.1  --   PLT 120* 138*   Cardiac Enzymes: No results for input(s): CKTOTAL, CKMB, CKMBINDEX,  TROPONINI in the last 8760 hours. BNP: Invalid input(s): POCBNP CBG: No results for input(s): GLUCAP in the last 8760 hours.  Procedures and Imaging Studies  During Stay: No results found.  Assessment/Plan:    1. Generalized weakness He has progressed well with PT and OT. He is walking without assistance with a walker.  His weakness was most likely due to the UTI, dysphagia, advancing age, and weight loss.  2. Continuous leakage of urine Will need assistance with incontinence care on enhanced AL.  Also has periodic fecal incontinence.  3. Weight loss Due to poor appetite and dysphagia  Continue Glucerna BID We discussed Remeron but I do not feel he is depressed so I am not sure he will see benefit from this medication.  His weight may be leveling off after a drop. He will need to f/u with Dr. Mariea Clonts regarding this issue.   4. Dysphagia, unspecified type He has not had pneumonia and reports improvement with his modified diet. He does not want to continue on thickened liquids long term due to QOL issues.   5. Benign prostatic hyperplasia with urinary frequency Denies symptoms Continue Cardura 2 mg qhs  6. Chronic congestive heart failure, unspecified heart failure type (HCC) Compensated Continue lasix 30  Mg three times weekly Monitor weight and edema May need dose reduction if he continues to lose weight  7. Essential hypertension, benign Controlled  Goal BP <150/90  8. Chronic atrial fibrillation (HCC) Rate controlled with lopressor Not on anticoagulation due to previous bleeding  9. Slow transit constipation Has soft regular stools without medication per staff  10. Hypothyroidism, unspecified type Continue synthroid 25 mcg qd  11. Skin lesion Left ear lesion will be evaluated by derm next week The lesion to his left buttock appears hyperkeratotic and may become irritated due to incontinence and rubbing on his chair. I have asked the staff to apply a mepilex to this  area. This can be evaluated by dermatology as well.   May d/c to [facility] when a room is available. He has made great progress but will long term need slightly more assistance than AL can provide. He will need to make an apt with Dr. Mariea Clonts in 2 weeks from his discharge.  He will continue to work with PT and OT. He already has a walker and motorized scooter. He will need to the nursing staff to manage his medication when he discharges.   Future labs/tests needed:  NA

## 2017-07-18 DIAGNOSIS — N390 Urinary tract infection, site not specified: Secondary | ICD-10-CM | POA: Diagnosis not present

## 2017-07-18 DIAGNOSIS — R1312 Dysphagia, oropharyngeal phase: Secondary | ICD-10-CM | POA: Diagnosis not present

## 2017-07-18 DIAGNOSIS — N3946 Mixed incontinence: Secondary | ICD-10-CM | POA: Diagnosis not present

## 2017-07-18 DIAGNOSIS — R2689 Other abnormalities of gait and mobility: Secondary | ICD-10-CM | POA: Diagnosis not present

## 2017-07-18 DIAGNOSIS — M6259 Muscle wasting and atrophy, not elsewhere classified, multiple sites: Secondary | ICD-10-CM | POA: Diagnosis not present

## 2017-07-18 DIAGNOSIS — R278 Other lack of coordination: Secondary | ICD-10-CM | POA: Diagnosis not present

## 2017-07-19 DIAGNOSIS — N390 Urinary tract infection, site not specified: Secondary | ICD-10-CM | POA: Diagnosis not present

## 2017-07-19 DIAGNOSIS — R278 Other lack of coordination: Secondary | ICD-10-CM | POA: Diagnosis not present

## 2017-07-19 DIAGNOSIS — R1312 Dysphagia, oropharyngeal phase: Secondary | ICD-10-CM | POA: Diagnosis not present

## 2017-07-19 DIAGNOSIS — M6259 Muscle wasting and atrophy, not elsewhere classified, multiple sites: Secondary | ICD-10-CM | POA: Diagnosis not present

## 2017-07-19 DIAGNOSIS — R2689 Other abnormalities of gait and mobility: Secondary | ICD-10-CM | POA: Diagnosis not present

## 2017-07-19 DIAGNOSIS — N3946 Mixed incontinence: Secondary | ICD-10-CM | POA: Diagnosis not present

## 2017-07-20 DIAGNOSIS — R2689 Other abnormalities of gait and mobility: Secondary | ICD-10-CM | POA: Diagnosis not present

## 2017-07-20 DIAGNOSIS — N390 Urinary tract infection, site not specified: Secondary | ICD-10-CM | POA: Diagnosis not present

## 2017-07-20 DIAGNOSIS — R1312 Dysphagia, oropharyngeal phase: Secondary | ICD-10-CM | POA: Diagnosis not present

## 2017-07-20 DIAGNOSIS — M6259 Muscle wasting and atrophy, not elsewhere classified, multiple sites: Secondary | ICD-10-CM | POA: Diagnosis not present

## 2017-07-20 DIAGNOSIS — N3946 Mixed incontinence: Secondary | ICD-10-CM | POA: Diagnosis not present

## 2017-07-20 DIAGNOSIS — R278 Other lack of coordination: Secondary | ICD-10-CM | POA: Diagnosis not present

## 2017-07-21 DIAGNOSIS — R278 Other lack of coordination: Secondary | ICD-10-CM | POA: Diagnosis not present

## 2017-07-21 DIAGNOSIS — R1312 Dysphagia, oropharyngeal phase: Secondary | ICD-10-CM | POA: Diagnosis not present

## 2017-07-21 DIAGNOSIS — N390 Urinary tract infection, site not specified: Secondary | ICD-10-CM | POA: Diagnosis not present

## 2017-07-21 DIAGNOSIS — R2689 Other abnormalities of gait and mobility: Secondary | ICD-10-CM | POA: Diagnosis not present

## 2017-07-21 DIAGNOSIS — N3946 Mixed incontinence: Secondary | ICD-10-CM | POA: Diagnosis not present

## 2017-07-21 DIAGNOSIS — M6259 Muscle wasting and atrophy, not elsewhere classified, multiple sites: Secondary | ICD-10-CM | POA: Diagnosis not present

## 2017-07-23 DIAGNOSIS — M6259 Muscle wasting and atrophy, not elsewhere classified, multiple sites: Secondary | ICD-10-CM | POA: Diagnosis not present

## 2017-07-23 DIAGNOSIS — N390 Urinary tract infection, site not specified: Secondary | ICD-10-CM | POA: Diagnosis not present

## 2017-07-23 DIAGNOSIS — N3946 Mixed incontinence: Secondary | ICD-10-CM | POA: Diagnosis not present

## 2017-07-23 DIAGNOSIS — R1312 Dysphagia, oropharyngeal phase: Secondary | ICD-10-CM | POA: Diagnosis not present

## 2017-07-23 DIAGNOSIS — R278 Other lack of coordination: Secondary | ICD-10-CM | POA: Diagnosis not present

## 2017-07-23 DIAGNOSIS — R2689 Other abnormalities of gait and mobility: Secondary | ICD-10-CM | POA: Diagnosis not present

## 2017-07-24 DIAGNOSIS — N3946 Mixed incontinence: Secondary | ICD-10-CM | POA: Diagnosis not present

## 2017-07-24 DIAGNOSIS — R2689 Other abnormalities of gait and mobility: Secondary | ICD-10-CM | POA: Diagnosis not present

## 2017-07-24 DIAGNOSIS — L858 Other specified epidermal thickening: Secondary | ICD-10-CM | POA: Diagnosis not present

## 2017-07-24 DIAGNOSIS — M6259 Muscle wasting and atrophy, not elsewhere classified, multiple sites: Secondary | ICD-10-CM | POA: Diagnosis not present

## 2017-07-24 DIAGNOSIS — R278 Other lack of coordination: Secondary | ICD-10-CM | POA: Diagnosis not present

## 2017-07-24 DIAGNOSIS — R1312 Dysphagia, oropharyngeal phase: Secondary | ICD-10-CM | POA: Diagnosis not present

## 2017-07-24 DIAGNOSIS — L821 Other seborrheic keratosis: Secondary | ICD-10-CM | POA: Diagnosis not present

## 2017-07-24 DIAGNOSIS — D485 Neoplasm of uncertain behavior of skin: Secondary | ICD-10-CM | POA: Diagnosis not present

## 2017-07-24 DIAGNOSIS — N390 Urinary tract infection, site not specified: Secondary | ICD-10-CM | POA: Diagnosis not present

## 2017-07-25 DIAGNOSIS — R2689 Other abnormalities of gait and mobility: Secondary | ICD-10-CM | POA: Diagnosis not present

## 2017-07-25 DIAGNOSIS — R278 Other lack of coordination: Secondary | ICD-10-CM | POA: Diagnosis not present

## 2017-07-25 DIAGNOSIS — R1312 Dysphagia, oropharyngeal phase: Secondary | ICD-10-CM | POA: Diagnosis not present

## 2017-07-25 DIAGNOSIS — M6259 Muscle wasting and atrophy, not elsewhere classified, multiple sites: Secondary | ICD-10-CM | POA: Diagnosis not present

## 2017-07-25 DIAGNOSIS — N390 Urinary tract infection, site not specified: Secondary | ICD-10-CM | POA: Diagnosis not present

## 2017-07-25 DIAGNOSIS — N3946 Mixed incontinence: Secondary | ICD-10-CM | POA: Diagnosis not present

## 2017-07-26 DIAGNOSIS — N398 Other specified disorders of urinary system: Secondary | ICD-10-CM | POA: Diagnosis not present

## 2017-07-26 DIAGNOSIS — M6259 Muscle wasting and atrophy, not elsewhere classified, multiple sites: Secondary | ICD-10-CM | POA: Diagnosis not present

## 2017-07-26 DIAGNOSIS — R2689 Other abnormalities of gait and mobility: Secondary | ICD-10-CM | POA: Diagnosis not present

## 2017-07-26 DIAGNOSIS — N3946 Mixed incontinence: Secondary | ICD-10-CM | POA: Diagnosis not present

## 2017-07-26 DIAGNOSIS — R278 Other lack of coordination: Secondary | ICD-10-CM | POA: Diagnosis not present

## 2017-07-31 DIAGNOSIS — N3946 Mixed incontinence: Secondary | ICD-10-CM | POA: Diagnosis not present

## 2017-07-31 DIAGNOSIS — R278 Other lack of coordination: Secondary | ICD-10-CM | POA: Diagnosis not present

## 2017-07-31 DIAGNOSIS — N398 Other specified disorders of urinary system: Secondary | ICD-10-CM | POA: Diagnosis not present

## 2017-07-31 DIAGNOSIS — R2689 Other abnormalities of gait and mobility: Secondary | ICD-10-CM | POA: Diagnosis not present

## 2017-07-31 DIAGNOSIS — M6259 Muscle wasting and atrophy, not elsewhere classified, multiple sites: Secondary | ICD-10-CM | POA: Diagnosis not present

## 2017-08-01 DIAGNOSIS — N3946 Mixed incontinence: Secondary | ICD-10-CM | POA: Diagnosis not present

## 2017-08-01 DIAGNOSIS — N398 Other specified disorders of urinary system: Secondary | ICD-10-CM | POA: Diagnosis not present

## 2017-08-01 DIAGNOSIS — M6259 Muscle wasting and atrophy, not elsewhere classified, multiple sites: Secondary | ICD-10-CM | POA: Diagnosis not present

## 2017-08-01 DIAGNOSIS — R2689 Other abnormalities of gait and mobility: Secondary | ICD-10-CM | POA: Diagnosis not present

## 2017-08-01 DIAGNOSIS — R278 Other lack of coordination: Secondary | ICD-10-CM | POA: Diagnosis not present

## 2017-08-02 DIAGNOSIS — R278 Other lack of coordination: Secondary | ICD-10-CM | POA: Diagnosis not present

## 2017-08-02 DIAGNOSIS — R2689 Other abnormalities of gait and mobility: Secondary | ICD-10-CM | POA: Diagnosis not present

## 2017-08-02 DIAGNOSIS — N398 Other specified disorders of urinary system: Secondary | ICD-10-CM | POA: Diagnosis not present

## 2017-08-02 DIAGNOSIS — M6259 Muscle wasting and atrophy, not elsewhere classified, multiple sites: Secondary | ICD-10-CM | POA: Diagnosis not present

## 2017-08-02 DIAGNOSIS — N3946 Mixed incontinence: Secondary | ICD-10-CM | POA: Diagnosis not present

## 2017-08-03 DIAGNOSIS — N3946 Mixed incontinence: Secondary | ICD-10-CM | POA: Diagnosis not present

## 2017-08-03 DIAGNOSIS — N398 Other specified disorders of urinary system: Secondary | ICD-10-CM | POA: Diagnosis not present

## 2017-08-03 DIAGNOSIS — M6259 Muscle wasting and atrophy, not elsewhere classified, multiple sites: Secondary | ICD-10-CM | POA: Diagnosis not present

## 2017-08-03 DIAGNOSIS — R278 Other lack of coordination: Secondary | ICD-10-CM | POA: Diagnosis not present

## 2017-08-03 DIAGNOSIS — R2689 Other abnormalities of gait and mobility: Secondary | ICD-10-CM | POA: Diagnosis not present

## 2017-08-04 DIAGNOSIS — R278 Other lack of coordination: Secondary | ICD-10-CM | POA: Diagnosis not present

## 2017-08-04 DIAGNOSIS — N398 Other specified disorders of urinary system: Secondary | ICD-10-CM | POA: Diagnosis not present

## 2017-08-04 DIAGNOSIS — R2689 Other abnormalities of gait and mobility: Secondary | ICD-10-CM | POA: Diagnosis not present

## 2017-08-04 DIAGNOSIS — M6259 Muscle wasting and atrophy, not elsewhere classified, multiple sites: Secondary | ICD-10-CM | POA: Diagnosis not present

## 2017-08-04 DIAGNOSIS — N3946 Mixed incontinence: Secondary | ICD-10-CM | POA: Diagnosis not present

## 2017-08-07 DIAGNOSIS — R278 Other lack of coordination: Secondary | ICD-10-CM | POA: Diagnosis not present

## 2017-08-07 DIAGNOSIS — N398 Other specified disorders of urinary system: Secondary | ICD-10-CM | POA: Diagnosis not present

## 2017-08-07 DIAGNOSIS — N3946 Mixed incontinence: Secondary | ICD-10-CM | POA: Diagnosis not present

## 2017-08-07 DIAGNOSIS — M6259 Muscle wasting and atrophy, not elsewhere classified, multiple sites: Secondary | ICD-10-CM | POA: Diagnosis not present

## 2017-08-07 DIAGNOSIS — R2689 Other abnormalities of gait and mobility: Secondary | ICD-10-CM | POA: Diagnosis not present

## 2017-08-08 DIAGNOSIS — N398 Other specified disorders of urinary system: Secondary | ICD-10-CM | POA: Diagnosis not present

## 2017-08-08 DIAGNOSIS — R2689 Other abnormalities of gait and mobility: Secondary | ICD-10-CM | POA: Diagnosis not present

## 2017-08-08 DIAGNOSIS — R278 Other lack of coordination: Secondary | ICD-10-CM | POA: Diagnosis not present

## 2017-08-08 DIAGNOSIS — N3946 Mixed incontinence: Secondary | ICD-10-CM | POA: Diagnosis not present

## 2017-08-08 DIAGNOSIS — M6259 Muscle wasting and atrophy, not elsewhere classified, multiple sites: Secondary | ICD-10-CM | POA: Diagnosis not present

## 2017-08-09 DIAGNOSIS — N398 Other specified disorders of urinary system: Secondary | ICD-10-CM | POA: Diagnosis not present

## 2017-08-09 DIAGNOSIS — R2689 Other abnormalities of gait and mobility: Secondary | ICD-10-CM | POA: Diagnosis not present

## 2017-08-09 DIAGNOSIS — R278 Other lack of coordination: Secondary | ICD-10-CM | POA: Diagnosis not present

## 2017-08-09 DIAGNOSIS — N3946 Mixed incontinence: Secondary | ICD-10-CM | POA: Diagnosis not present

## 2017-08-09 DIAGNOSIS — M6259 Muscle wasting and atrophy, not elsewhere classified, multiple sites: Secondary | ICD-10-CM | POA: Diagnosis not present

## 2017-08-16 ENCOUNTER — Encounter: Payer: Self-pay | Admitting: Internal Medicine

## 2017-08-16 ENCOUNTER — Non-Acute Institutional Stay: Payer: Medicare Other | Admitting: Internal Medicine

## 2017-08-16 VITALS — BP 128/68 | HR 58 | Temp 97.6°F | Wt 190.0 lb

## 2017-08-16 DIAGNOSIS — I509 Heart failure, unspecified: Secondary | ICD-10-CM

## 2017-08-16 DIAGNOSIS — M7732 Calcaneal spur, left foot: Secondary | ICD-10-CM

## 2017-08-16 DIAGNOSIS — H52203 Unspecified astigmatism, bilateral: Secondary | ICD-10-CM | POA: Diagnosis not present

## 2017-08-16 DIAGNOSIS — H0100A Unspecified blepharitis right eye, upper and lower eyelids: Secondary | ICD-10-CM | POA: Diagnosis not present

## 2017-08-16 DIAGNOSIS — H353131 Nonexudative age-related macular degeneration, bilateral, early dry stage: Secondary | ICD-10-CM | POA: Diagnosis not present

## 2017-08-16 DIAGNOSIS — M25562 Pain in left knee: Secondary | ICD-10-CM | POA: Diagnosis not present

## 2017-08-16 DIAGNOSIS — K5901 Slow transit constipation: Secondary | ICD-10-CM

## 2017-08-16 DIAGNOSIS — H0100B Unspecified blepharitis left eye, upper and lower eyelids: Secondary | ICD-10-CM | POA: Diagnosis not present

## 2017-08-16 DIAGNOSIS — E039 Hypothyroidism, unspecified: Secondary | ICD-10-CM

## 2017-08-16 NOTE — Progress Notes (Signed)
Location:  [facility] of Service:  Clinic (12)  Provider: Robin Petrakis L. Mariea Clonts, D.O., C.M.D.  Code Status: DNR Goals of Care:  Advanced Directives 08/16/2017  Does Patient Have a Medical Advance Directive? Yes  Type of Advance Directive Out of facility DNR (pink MOST or yellow form);[place];Living will  Does patient want to make changes to medical advance directive? No - Patient declined  Copy of [place] in Chart? Yes  Pre-existing out of facility DNR order (yellow form or pink MOST form) Yellow form placed in chart (order not valid for inpatient use)     Chief Complaint  Patient presents with  . Acute Visit    discuss knee injection, pain not so bad now, left heal pain x80mths ago    HPI: Patient is a 83 y.o. male seen today for an acute visit for possible knee injection, left heel pain.    Every morning when he's woken up, they ask if he has pain.  He says his left knee hurts medially.  He does not think his problem in the knee is serious.  It only hurts at night.  He is using a pillow under his knees that has substantially reduced his pain at night.  If it bothers him, he gets up and walks it off, then it does not hurt.  Sometimes asked for tylenol to reduce pain and seems to put him to sleep, as well.  Right leg is the weak one that he tends to fall on.    Left heel pain is 3x as bad as the knee.  Very strong pain only at night.  About a 6-6.5/10.  Will wake him up, tries to reposition, walks around, then goes away.  Had a shot in his heel 30 years ago (before a move to [place]).  Very rarely bothersome when walking.    He says he's not as comfortable in AL.  He says he gets great service from enhanced AL and rehab.    He had an eye appt this am.  Only needs a small change in prescription.  Having difficulty seeing people at a distance.    His weight is down to 190 lbs.  He weighed 215 lbs.   He had a period of low appetite  where his weight went down to 184 lbs.  He admits he had indigestion for a while, but not any longer and would like the omeprazole stopped.    C/o taking the lasix every other day. Has to go to the restroom frequently.  CHF is now well controlled.  Still has 2+ edema but refuses to take more diuretics, denies sob but does have some chronic wet cough.  Also reports, he often has a bm twice a day.  Wants to go to once a day with the glucerna shake.     Past Medical History:  Diagnosis Date  . Abnormality of gait 02/2010  . Anal and rectal polyp 1980  . Atrial fibrillation ([place]) 11/2009  . Chronic kidney disease, stage III (moderate) ([place]) 08/07/2013  . Deviated nasal septum 11/2009  . Diaphragmatic hernia without mention of obstruction or gangrene 2007  . Diarrhea 02/2010  . Dizziness and giddiness 2009  . Edema 2010  . Essential and other specified forms of tremor 2007  . 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 Hyperglycemia 01/02/2013  . Hypertrophy of prostate without urinary obstruction and other lower urinary tract symptoms (LUTS) 2002  .  Insomnia with sleep apnea, unspecified 02/2010  . Irritable bowel syndrome 11/14/2011  . Long term (current) use of anticoagulants 11/2009  . Other abnormal blood chemistry 02/09/2009  . Other B-complex deficiencies 2008  . Other dyspnea and respiratory abnormality 11/2009  . Other malaise and fatigue 2007  . Pain in joint, lower leg 02/2010  . Palpitations 2007  . Reflux esophagitis 2007  . Sebaceous cyst 2008  . Tension headache 2009  . Undiagnosed cardiac murmurs 2002  . Unspecified constipation 07/04/2012  . Unspecified essential hypertension 2007  . Unspecified hereditary and idiopathic peripheral neuropathy 02/2010  . Unspecified transient cerebral ischemia 2002  . Urinary frequency 10/01/2012    No past surgical history on file.  Allergies  Allergen Reactions  . Plavix [Clopidogrel Bisulfate]     indigestion     Outpatient Encounter Medications as of 08/16/2017  Medication Sig  . Acetaminophen 500 MG coapsule Take by mouth. Take 2 tablets once daily as needed  . antiseptic oral rinse (BIOTENE) LIQD 15 mLs 4 (four) times daily by Mouth Rinse route.  . doxazosin (CARDURA) 2 MG tablet Take 1 tablet (2 mg total) by mouth every morning.  . furosemide (LASIX) 20 MG tablet Take 1.5 tablets by mouth on Monday, Wednesday and Friday ongoing  . GLUCERNA (GLUCERNA) LIQD Take 237 mLs 2 (two) times daily between meals by mouth.  . levothyroxine (SYNTHROID, LEVOTHROID) 25 MCG tablet Take 1 tablet (25 mcg total) by mouth daily before breakfast.  . metoprolol succinate (TOPROL-XL) 25 MG 24 hr tablet Take 25 mg by mouth daily.   . metoprolol tartrate (LOPRESSOR) 25 MG tablet TAKE ONE-HALF (1/2) TABLET AS NEEDED FOR PALPITATIONS  . omeprazole (PRILOSEC OTC) 20 MG tablet Take 20 mg 2 (two) times daily by mouth.   . ondansetron (ZOFRAN) 4 MG tablet Take 4 mg by mouth every 6 (six) hours as needed for nausea or vomiting.  . potassium chloride (KLOR-CON) 20 MEQ packet Take 1 packet by mouth every Monday, Wednesday and Friday  . trolamine salicylate (ASPERCREME) 10 % cream Apply 1 application topically 2 (two) times daily as needed.  . vitamin B-12 (CYANOCOBALAMIN) 1000 MCG tablet Take 1,000 mcg by mouth daily.   No facility-administered encounter medications on file as of 08/16/2017.     Review of Systems:  Review of Systems  Constitutional: Negative for chills, fever and malaise/fatigue.  HENT: Positive for hearing loss.   Eyes: Negative for blurred vision.  Respiratory: Positive for cough. Negative for sputum production and shortness of breath.   Cardiovascular: Positive for leg swelling. Negative for chest pain and palpitations.  Gastrointestinal: Negative for abdominal pain, blood in stool, constipation and melena.       Bid BMs now  Genitourinary: Negative for dysuria.  Musculoskeletal: Positive for joint  pain. Negative for falls.       Tenderness of central left heel  Skin: Negative for itching and rash.  Neurological: Negative for dizziness, loss of consciousness and weakness.  Endo/Heme/Allergies: Does not bruise/bleed easily.  Psychiatric/Behavioral: Positive for memory loss.       Mild cognitive impairment    Health Maintenance  Topic Date Due  . TETANUS/TDAP  02/24/2027  . INFLUENZA VACCINE  Completed  . PNA vac Low Risk Adult  Completed    Physical Exam: Vitals:   08/16/17 1505  BP: 128/68  Pulse: (!) 58  Temp: 97.6 F (36.4 C)  TempSrc: Oral  SpO2: 98%  Weight: 190 lb (86.2 kg)   Body mass index  is 25.77 kg/m. Physical Exam  Constitutional: He is oriented to person, place, and time. He appears well-developed and well-nourished. No distress.  Cardiovascular: Normal rate, regular rhythm, normal heart sounds and intact distal pulses.  Pulmonary/Chest: Effort normal and breath sounds normal. No respiratory distress. He has no rales.  Abdominal: Soft. Bowel sounds are normal. He exhibits no distension. There is no tenderness.  Musculoskeletal: Normal range of motion. He exhibits tenderness.  Of left mid-heel  Neurological: He is alert and oriented to person, place, and time.  Skin: Skin is warm and dry.  Psychiatric: He has a normal mood and affect.    Labs reviewed: Basic Metabolic Panel: Recent Labs    10/23/16 1627  03/20/17 03/20/17 0700 06/25/17 07/03/17  NA 140   < >  --  143 137 139  K 3.8   < >  --  4.9 3.8 3.8  CL 106  --   --   --   --   --   CO2 26  --   --   --   --   --   GLUCOSE 124*  --   --   --   --   --   BUN 22*   < >  --  28* 32* 39*  CREATININE 1.49*   < >  --  1.3 1.6* 1.6*  CALCIUM 8.7*  --   --   --   --   --   MG 2.1  --   --   --   --   --   TSH  --   --  5.05  --   --  2.59   < > = values in this interval not displayed.   Liver Function Tests: No results for input(s): AST, ALT, ALKPHOS, BILITOT, PROT, ALBUMIN in the last 8760  hours. No results for input(s): LIPASE, AMYLASE in the last 8760 hours. No results for input(s): AMMONIA in the last 8760 hours. CBC: Recent Labs    10/23/16 1627 06/25/17  WBC 5.1 12.9  HGB 12.8* 12.9*  HCT 39.2 39*  MCV 95.1  --   PLT 120* 138*   Lipid Panel: No results for input(s): CHOL, HDL, LDLCALC, TRIG, CHOLHDL, LDLDIRECT in the last 8760 hours. Lab Results  Component Value Date   HGBA1C 5.3 03/01/2016   Assessment/Plan 1. Heel spur, left -referred to Dr. Buelah Manis for possible heel injection or other more conservative intervention -odd to me that it hurts him only at night and not so much with walking -cont tylenol at hs for now  2. Left medial knee pain -cont pillows between knees -refused injection today which was why nursing made appt -he also refused to use a nightly patch like salonpas with lidocaine in favor of his continue two tylenol at bedtime b/c he does not actually get the pain every night and it resolves with walking it off (but then complains nursing must come to room for him to get up)--fortunately goes right back to sleep  3. Chronic congestive heart failure, unspecified heart failure type ([place]) -educated about this, has never had echo at his request so not clear cause -cont qod lasix b/c he won't take more than this, also cont potassium -still seems a bit wet to me, but he won't agree to more lasix and was trying to get less!!!  Has wet cough and edema still there--also not faithful with compression hose  4. Hypothyroidism, unspecified type -stable now on levothyroxine very low dose, weight  stabilized too Lab Results  Component Value Date   TSH 2.59 07/03/2017   5. Slow transit constipation -not on a regimen now and having bid bms which bother him so will reduce glucerna to just once a day b/c his daughter and I both think this may be the cause  Labs/tests ordered:  No orders of the defined types were placed in this encounter.  Next appt:   12/27/2017  Teaghan Formica L. Janay Canan, D.O. [facility] [address], [place] [address] Cell Phone (Mon-Fri 8am-5pm):  [phone] On Call:  [phone] & follow prompts after 5pm & weekends Office Phone:  [phone] Office Fax:  [phone]

## 2017-12-25 DIAGNOSIS — D649 Anemia, unspecified: Secondary | ICD-10-CM | POA: Diagnosis not present

## 2017-12-25 DIAGNOSIS — I5020 Unspecified systolic (congestive) heart failure: Secondary | ICD-10-CM | POA: Diagnosis not present

## 2017-12-25 DIAGNOSIS — R7989 Other specified abnormal findings of blood chemistry: Secondary | ICD-10-CM | POA: Diagnosis not present

## 2017-12-25 LAB — BASIC METABOLIC PANEL
BUN: 31 — AB (ref 4–21)
Creatinine: 1.4 — AB (ref 0.6–1.3)
Glucose: 97
Potassium: 4.5 (ref 3.4–5.3)
Sodium: 144 (ref 137–147)

## 2017-12-25 LAB — CBC AND DIFFERENTIAL
HCT: 39 — AB (ref 41–53)
Hemoglobin: 12.6 — AB (ref 13.5–17.5)
Platelets: 139 — AB (ref 150–399)
WBC: 4.4

## 2017-12-26 ENCOUNTER — Encounter: Payer: Self-pay | Admitting: Internal Medicine

## 2017-12-27 ENCOUNTER — Encounter: Payer: Self-pay | Admitting: Internal Medicine

## 2017-12-27 ENCOUNTER — Non-Acute Institutional Stay: Payer: Medicare Other | Admitting: Internal Medicine

## 2017-12-27 VITALS — BP 130/60 | HR 59 | Temp 97.8°F | Ht 72.0 in | Wt 195.0 lb

## 2017-12-27 DIAGNOSIS — I509 Heart failure, unspecified: Secondary | ICD-10-CM

## 2017-12-27 DIAGNOSIS — E039 Hypothyroidism, unspecified: Secondary | ICD-10-CM

## 2017-12-27 DIAGNOSIS — M7732 Calcaneal spur, left foot: Secondary | ICD-10-CM | POA: Diagnosis not present

## 2017-12-27 DIAGNOSIS — M25562 Pain in left knee: Secondary | ICD-10-CM | POA: Diagnosis not present

## 2017-12-27 DIAGNOSIS — K591 Functional diarrhea: Secondary | ICD-10-CM | POA: Diagnosis not present

## 2017-12-27 DIAGNOSIS — N3945 Continuous leakage: Secondary | ICD-10-CM

## 2017-12-27 NOTE — Progress Notes (Signed)
Location:  Occupational psychologist of Service:  Clinic (12)  Provider: Zafirah Vanzee L. Mariea Clonts, D.O., C.M.D.  Code Status: DNR Goals of Care:  Advanced Directives 12/27/2017  Does Patient Have a Medical Advance Directive? Yes  Type of Paramedic of [place];Out of facility DNR (pink MOST or yellow form)  Does patient want to make changes to medical advance directive? No - Patient declined  Copy of [place] in Chart? Yes  Pre-existing out of facility DNR order (yellow form or pink MOST form) Yellow form placed in chart (order not valid for inpatient use)   Chief Complaint  Patient presents with  . Medical Management of Chronic Issues    65mth follow-up    HPI: Patient is a 84 y.o. male seen today for medical management of chronic diseases.  He's accompanied by his daughter.  2 nights per week, heel hurts him.  He says he will see podiatry now.  He refused when the time came last time I ordered this b/c he does not have the pain every night. .    He get up and it relieves his knee pain--will change to his recliner chair.  Still does not want a knee injection.    Has lots of gas and he burps and belches more often that he used to.  Happens almost invariably after meals.  Not every meal.  Thinks he might be eating too quickly.    He is forgetting the last item...  He is having more frequent bms now.  He insists we are giving him a laxative, but he's not on any.  Easter Sunday he had 3 regular sized bms.  He wants to reduce his laxative.  He always goes after breakfast--has to hurry ot get there in time then.  No shortness of breath, but ongoing edema in his legs.  As usual, he requests his diuretics to be reduced which cannot be done safely.  Urinary frequency remains annoying to him.  Past Medical History:  Diagnosis Date  . Abnormality of gait 02/2010  . Anal and rectal polyp 1980  . Atrial fibrillation ([place]) 11/2009  . Chronic  kidney disease, stage III (moderate) ([place]) 08/07/2013  . Deviated nasal septum 11/2009  . Diaphragmatic hernia without mention of obstruction or gangrene 2007  . Diarrhea 02/2010  . Dizziness and giddiness 2009  . Edema 2010  . Essential and other specified forms of tremor 2007  . 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 Hyperglycemia 01/02/2013  . Hypertrophy of prostate without urinary obstruction and other lower urinary tract symptoms (LUTS) 2002  . Insomnia with sleep apnea, unspecified 02/2010  . Irritable bowel syndrome 11/14/2011  . Long term (current) use of anticoagulants 11/2009  . Other abnormal blood chemistry 02/09/2009  . Other B-complex deficiencies 2008  . Other dyspnea and respiratory abnormality 11/2009  . Other malaise and fatigue 2007  . Pain in joint, lower leg 02/2010  . Palpitations 2007  . Reflux esophagitis 2007  . Sebaceous cyst 2008  . Tension headache 2009  . Undiagnosed cardiac murmurs 2002  . Unspecified constipation 07/04/2012  . Unspecified essential hypertension 2007  . Unspecified hereditary and idiopathic peripheral neuropathy 02/2010  . Unspecified transient cerebral ischemia 2002  . Urinary frequency 10/01/2012    No past surgical history on file.  Allergies  Allergen Reactions  . Plavix [Clopidogrel Bisulfate]     indigestion    Outpatient Encounter Medications as of 12/27/2017  Medication Sig  . Acetaminophen 500 MG coapsule Take by mouth. Take 2 tablets once daily as needed  . antiseptic oral rinse (BIOTENE) LIQD 15 mLs 4 (four) times daily by Mouth Rinse route.  . doxazosin (CARDURA) 2 MG tablet Take 1 tablet (2 mg total) by mouth every morning.  . Eyelid Cleansers (OCUSOFT EYELID CLEANSING) PADS Apply topically as needed.  . furosemide (LASIX) 20 MG tablet Take 1.5 tablets by mouth on Monday, Wednesday and Friday ongoing  . levothyroxine (SYNTHROID, LEVOTHROID) 25 MCG tablet Take 1 tablet (25 mcg total) by mouth daily  before breakfast.  . metoprolol succinate (TOPROL-XL) 25 MG 24 hr tablet Take 25 mg by mouth daily.   . metoprolol tartrate (LOPRESSOR) 25 MG tablet TAKE ONE-HALF (1/2) TABLET AS NEEDED FOR PALPITATIONS  . omeprazole (PRILOSEC OTC) 20 MG tablet Take 20 mg 2 (two) times daily by mouth.   . ondansetron (ZOFRAN) 4 MG tablet Take 4 mg by mouth every 6 (six) hours as needed for nausea or vomiting.  . potassium chloride 20 MEQ/15ML (10%) SOLN   . trolamine salicylate (ASPERCREME) 10 % cream Apply 1 application topically 2 (two) times daily as needed.  . vitamin B-12 (CYANOCOBALAMIN) 1000 MCG tablet Take 1,000 mcg by mouth daily.  . [DISCONTINUED] GLUCERNA (GLUCERNA) LIQD Take 237 mLs by mouth daily.  . [DISCONTINUED] potassium chloride (KLOR-CON) 20 MEQ packet Take 1 packet by mouth every Monday, Wednesday and Friday   No facility-administered encounter medications on file as of 12/27/2017.     Review of Systems:  Review of Systems  Constitutional: Positive for malaise/fatigue. Negative for chills, fever and weight loss.  HENT: Positive for hearing loss. Negative for congestion.   Eyes:       Glasses  Respiratory: Negative for cough, shortness of breath and wheezing.   Cardiovascular: Positive for leg swelling. Negative for chest pain and palpitations.  Gastrointestinal: Positive for diarrhea. Negative for abdominal pain, blood in stool, constipation and melena.  Genitourinary: Positive for frequency and urgency. Negative for dysuria, flank pain and hematuria.  Musculoskeletal: Positive for joint pain. Negative for falls.  Skin: Negative for itching and rash.  Neurological: Negative for dizziness and loss of consciousness.  Endo/Heme/Allergies: Bruises/bleeds easily.  Psychiatric/Behavioral: Positive for memory loss. Negative for depression. The patient is not nervous/anxious and does not have insomnia.        Mild cognitive impairment    Health Maintenance  Topic Date Due  . INFLUENZA  VACCINE  04/05/2018  . TETANUS/TDAP  02/24/2027  . PNA vac Low Risk Adult  Completed    Physical Exam: Vitals:   12/27/17 1331  BP: 130/60  Pulse: (!) 59  Temp: 97.8 F (36.6 C)  TempSrc: Oral  SpO2: 96%  Weight: 195 lb (88.5 kg)  Height: 6' (1.829 m)   Body mass index is 26.45 kg/m. Physical Exam  Constitutional: He is oriented to person, place, and time. He appears well-developed and well-nourished. No distress.  HENT:  Head: Normocephalic and atraumatic.  Hearing aids  Eyes:  glasses  Cardiovascular: Normal rate, regular rhythm and intact distal pulses.  Murmur heard. 2+ edema bilaterally, not wearing compression hose  Pulmonary/Chest: Effort normal and breath sounds normal. He has no rales.  Abdominal: Bowel sounds are normal. He exhibits no distension. There is no tenderness.  Musculoskeletal: Normal range of motion.  Comes in power scooter, ambulates short distances with walker  Neurological: He is alert and oriented to person, place, and time.  But some errors in  historical details  Skin: Skin is warm and dry. Capillary refill takes less than 2 seconds.  Psychiatric: He has a normal mood and affect.    Labs reviewed: Basic Metabolic Panel: Recent Labs    03/20/17  06/25/17 07/03/17 12/25/17 0800  NA  --    < > 137 139 144  K  --    < > 3.8 3.8 4.5  BUN  --    < > 32* 39* 31*  CREATININE  --    < > 1.6* 1.6* 1.4*  TSH 5.05  --   --  2.59  --    < > = values in this interval not displayed.   Liver Function Tests: No results for input(s): AST, ALT, ALKPHOS, BILITOT, PROT, ALBUMIN in the last 8760 hours. No results for input(s): LIPASE, AMYLASE in the last 8760 hours. No results for input(s): AMMONIA in the last 8760 hours. CBC: Recent Labs    06/25/17 12/25/17 0800  WBC 12.9 4.4  HGB 12.9* 12.6*  HCT 39* 39*  PLT 138* 139*   Lipid Panel: No results for input(s): CHOL, HDL, LDLCALC, TRIG, CHOLHDL, LDLDIRECT in the last 8760 hours. Lab Results    Component Value Date   HGBA1C 5.3 03/01/2016    Assessment/Plan 1. Heel spur, left -agrees again to podiatry referral so placed   2. Left medial knee pain -ongoing some nights, but refuses any further intervention  3. Chronic congestive heart failure, unspecified heart failure type (HCC) -cont current diuretic regimen, should be wearing his compression hose, elevating his feet at rest, but he is not adherent -refuses more lasix b/c he already has to urinate too much and has difficulty with leakage  4. Functional diarrhea -ongoing, off bowel regimen and still having more frequent bms than once did; eats well especially for a 84 yo  5. Continuous leakage of urine -ongoing, due to frailty and diuretics that are necessary for his CHF mgt (really needs more but won't accept them)  6. Hypothyroidism, unspecified type Lab Results  Component Value Date   TSH 2.59 07/03/2017  will check annually -cont same levothyroxine as above  Labs/tests ordered:  No new Next appt:  05/16/2018 med mgt  Jaden Abreu L. Keaundre Thelin, D.O. [facility] [address], [place] [address] Cell Phone (Mon-Fri 8am-5pm):  [phone] On Call:  [phone] & follow prompts after 5pm & weekends Office Phone:  [phone] Office Fax:  [phone]

## 2018-02-14 DIAGNOSIS — H0100B Unspecified blepharitis left eye, upper and lower eyelids: Secondary | ICD-10-CM | POA: Diagnosis not present

## 2018-02-14 DIAGNOSIS — H35352 Cystoid macular degeneration, left eye: Secondary | ICD-10-CM | POA: Diagnosis not present

## 2018-02-14 DIAGNOSIS — H353131 Nonexudative age-related macular degeneration, bilateral, early dry stage: Secondary | ICD-10-CM | POA: Diagnosis not present

## 2018-02-14 DIAGNOSIS — H0100A Unspecified blepharitis right eye, upper and lower eyelids: Secondary | ICD-10-CM | POA: Diagnosis not present

## 2018-02-27 ENCOUNTER — Non-Acute Institutional Stay: Payer: Medicare Other

## 2018-02-27 DIAGNOSIS — Z0000 Encounter for general adult medical examination without abnormal findings: Secondary | ICD-10-CM

## 2018-02-27 NOTE — Patient Instructions (Signed)
Gary [name] , Thank you for taking time to come for your Medicare Wellness Visit. I appreciate your ongoing commitment to your health goals. Please review the following plan we discussed and let me know if I can assist you in the future.   Screening recommendations/referrals: Colonoscopy excluded, over age 82 Recommended yearly ophthalmology/optometry visit for glaucoma screening and checkup Recommended yearly dental visit for hygiene and checkup  Vaccinations: Influenza vaccine up to date, due 2019 fall season Pneumococcal vaccine up to date, completed Tdap vaccine up to date, due 02/24/2027 Shingles vaccine up to date, waiting for second Shingrix shot    Advanced directives: in chart  Conditions/risks identified: none  Next appointment: Dr. Mariea Clonts 05/16/2018 @ 11am  Preventive Care 22 Years and Older, Male Preventive care refers to lifestyle choices and visits with your health care provider that can promote health and wellness. What does preventive care include?  A yearly physical exam. This is also called an annual well check.  Dental exams once or twice a year.  Routine eye exams. Ask your health care provider how often you should have your eyes checked.  Personal lifestyle choices, including:  Daily care of your teeth and gums.  Regular physical activity.  Eating a healthy diet.  Avoiding tobacco and drug use.  Limiting alcohol use.  Practicing safe sex.  Taking low doses of aspirin every day.  Taking vitamin and mineral supplements as recommended by your health care provider. What happens during an annual well check? The services and screenings done by your health care provider during your annual well check will depend on your age, overall health, lifestyle risk factors, and family history of disease. Counseling  Your health care provider may ask you questions about your:  Alcohol use.  Tobacco use.  Drug use.  Emotional well-being.  Home and relationship  well-being.  Sexual activity.  Eating habits.  History of falls.  Memory and ability to understand (cognition).  Work and work Statistician. Screening  You may have the following tests or measurements:  Height, weight, and BMI.  Blood pressure.  Lipid and cholesterol levels. These may be checked every 5 years, or more frequently if you are over 80 years old.  Skin check.  Lung cancer screening. You may have this screening every year starting at age 61 if you have a 30-pack-year history of smoking and currently smoke or have quit within the past 15 years.  Fecal occult blood test (FOBT) of the stool. You may have this test every year starting at age 49.  Flexible sigmoidoscopy or colonoscopy. You may have a sigmoidoscopy every 5 years or a colonoscopy every 10 years starting at age 74.  Prostate cancer screening. Recommendations will vary depending on your family history and other risks.  Hepatitis C blood test.  Hepatitis B blood test.  Sexually transmitted disease (STD) testing.  Diabetes screening. This is done by checking your blood sugar (glucose) after you have not eaten for a while (fasting). You may have this done every 1-3 years.  Abdominal aortic aneurysm (AAA) screening. You may need this if you are a current or former smoker.  Osteoporosis. You may be screened starting at age 40 if you are at high risk. Talk with your health care provider about your test results, treatment options, and if necessary, the need for more tests. Vaccines  Your health care provider may recommend certain vaccines, such as:  Influenza vaccine. This is recommended every year.  Tetanus, diphtheria, and acellular pertussis (Tdap, Td)  vaccine. You may need a Td booster every 10 years.  Zoster vaccine. You may need this after age 39.  Pneumococcal 13-valent conjugate (PCV13) vaccine. One dose is recommended after age 24.  Pneumococcal polysaccharide (PPSV23) vaccine. One dose is  recommended after age 72. Talk to your health care provider about which screenings and vaccines you need and how often you need them. This information is not intended to replace advice given to you by your health care provider. Make sure you discuss any questions you have with your health care provider. Document Released: 09/18/2015 Document Revised: 05/11/2016 Document Reviewed: 06/23/2015 Elsevier Interactive Patient Education  2017 [place] Prevention in the Home Falls can cause injuries. They can happen to people of all ages. There are many things you can do to make your home safe and to help prevent falls. What can I do on the outside of my home?  Regularly fix the edges of walkways and driveways and fix any cracks.  Remove anything that might make you trip as you walk through a door, such as a raised step or threshold.  Trim any bushes or trees on the path to your home.  Use bright outdoor lighting.  Clear any walking paths of anything that might make someone trip, such as rocks or tools.  Regularly check to see if handrails are loose or broken. Make sure that both sides of any steps have handrails.  Any raised decks and porches should have guardrails on the edges.  Have any leaves, snow, or ice cleared regularly.  Use sand or salt on walking paths during winter.  Clean up any spills in your garage right away. This includes oil or grease spills. What can I do in the bathroom?  Use night lights.  Install grab bars by the toilet and in the tub and shower. Do not use towel bars as grab bars.  Use non-skid mats or decals in the tub or shower.  If you need to sit down in the shower, use a plastic, non-slip stool.  Keep the floor dry. Clean up any water that spills on the floor as soon as it happens.  Remove soap buildup in the tub or shower regularly.  Attach bath mats securely with double-sided non-slip rug tape.  Do not have throw rugs and other things on  the floor that can make you trip. What can I do in the bedroom?  Use night lights.  Make sure that you have a light by your bed that is easy to reach.  Do not use any sheets or blankets that are too big for your bed. They should not hang down onto the floor.  Have a firm chair that has side arms. You can use this for support while you get dressed.  Do not have throw rugs and other things on the floor that can make you trip. What can I do in the kitchen?  Clean up any spills right away.  Avoid walking on wet floors.  Keep items that you use a lot in easy-to-reach places.  If you need to reach something above you, use a strong step stool that has a grab bar.  Keep electrical cords out of the way.  Do not use floor polish or wax that makes floors slippery. If you must use wax, use non-skid floor wax.  Do not have throw rugs and other things on the floor that can make you trip. What can I do with my stairs?  Do not leave  any items on the stairs.  Make sure that there are handrails on both sides of the stairs and use them. Fix handrails that are broken or loose. Make sure that handrails are as long as the stairways.  Check any carpeting to make sure that it is firmly attached to the stairs. Fix any carpet that is loose or worn.  Avoid having throw rugs at the top or bottom of the stairs. If you do have throw rugs, attach them to the floor with carpet tape.  Make sure that you have a light switch at the top of the stairs and the bottom of the stairs. If you do not have them, ask someone to add them for you. What else can I do to help prevent falls?  Wear shoes that:  Do not have high heels.  Have rubber bottoms.  Are comfortable and fit you well.  Are closed at the toe. Do not wear sandals.  If you use a stepladder:  Make sure that it is fully opened. Do not climb a closed stepladder.  Make sure that both sides of the stepladder are locked into place.  Ask someone to  hold it for you, if possible.  Clearly mark and make sure that you can see:  Any grab bars or handrails.  First and last steps.  Where the edge of each step is.  Use tools that help you move around (mobility aids) if they are needed. These include:  Canes.  Walkers.  Scooters.  Crutches.  Turn on the lights when you go into a dark area. Replace any light bulbs as soon as they burn out.  Set up your furniture so you have a clear path. Avoid moving your furniture around.  If any of your floors are uneven, fix them.  If there are any pets around you, be aware of where they are.  Review your medicines with your doctor. Some medicines can make you feel dizzy. This can increase your chance of falling. Ask your doctor what other things that you can do to help prevent falls. This information is not intended to replace advice given to you by your health care provider. Make sure you discuss any questions you have with your health care provider. Document Released: 06/18/2009 Document Revised: 01/28/2016 Document Reviewed: 09/26/2014 Elsevier Interactive Patient Education  2017 [employer].

## 2018-02-27 NOTE — Progress Notes (Addendum)
Subjective:   Gary [name] is a 84 y.o. male who presents for Medicare Annual/Subsequent preventive examination at [facility]-02/21/2017    Objective:    Vitals: BP 128/60 (BP Location: Right Arm, Patient Position: Sitting)   Pulse 60   Temp 98 F (36.7 C) (Oral)   Ht 6' (1.829 m)   Wt 195 lb (88.5 kg)   BMI 26.45 kg/m   Body mass index is 26.45 kg/m.  Advanced Directives 02/27/2018 12/27/2017 08/16/2017 06/27/2017 06/26/2017 03/22/2017 02/21/2017  Does Patient Have a Medical Advance Directive? Yes Yes Yes Yes Yes Yes Yes  Type of Paramedic of [place];Out of facility DNR (pink MOST or yellow form) [place];Out of facility DNR (pink MOST or yellow form) Out of facility DNR (pink MOST or yellow form);[place];Living will Out of facility DNR (pink MOST or yellow form);[place];Living will [place];Living will Out of facility DNR (pink MOST or yellow form);[place];Living will [place];Living will;Out of facility DNR (pink MOST or yellow form)  Does patient want to make changes to medical advance directive? No - Patient declined No - Patient declined No - Patient declined - No - Patient declined - No - Patient declined  Copy of [place] in Chart? Yes Yes Yes Yes Yes Yes Yes  Pre-existing out of facility DNR order (yellow form or pink MOST form) Yellow form placed in chart (order not valid for inpatient use) Yellow form placed in chart (order not valid for inpatient use) Yellow form placed in chart (order not valid for inpatient use) - - Yellow form placed in chart (order not valid for inpatient use) Yellow form placed in chart (order not valid for inpatient use)    Tobacco Social History   Tobacco Use  Smoking Status Never Smoker  Smokeless Tobacco Never Used     Counseling given: Not  Answered   Clinical Intake:  Pre-visit preparation completed: No  Pain : No/denies pain     Nutritional Risks: None Diabetes: No  How often do you need to have someone help you when you read instructions, pamphlets, or other written materials from your doctor or pharmacy?: 1 - Never What is the last grade level you completed in school?: College  Interpreter Needed?: No  Information entered by :: Tyson Dense, RN  Past Medical History:  Diagnosis Date  . Abnormality of gait 02/2010  . Anal and rectal polyp 1980  . Atrial fibrillation ([place]) 11/2009  . Chronic kidney disease, stage III (moderate) ([place]) 08/07/2013  . Deviated nasal septum 11/2009  . Diaphragmatic hernia without mention of obstruction or gangrene 2007  . Diarrhea 02/2010  . Dizziness and giddiness 2009  . Edema 2010  . Essential and other specified forms of tremor 2007  . 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 Hyperglycemia 01/02/2013  . Hypertrophy of prostate without urinary obstruction and other lower urinary tract symptoms (LUTS) 2002  . Insomnia with sleep apnea, unspecified 02/2010  . Irritable bowel syndrome 11/14/2011  . Long term (current) use of anticoagulants 11/2009  . Other abnormal blood chemistry 02/09/2009  . Other B-complex deficiencies 2008  . Other dyspnea and respiratory abnormality 11/2009  . Other malaise and fatigue 2007  . Pain in joint, lower leg 02/2010  . Palpitations 2007  . Reflux esophagitis 2007  . Sebaceous cyst 2008  . Tension headache 2009  .  Undiagnosed cardiac murmurs 2002  . Unspecified constipation 07/04/2012  . Unspecified essential hypertension 2007  . Unspecified hereditary and idiopathic peripheral neuropathy 02/2010  . Unspecified transient cerebral ischemia 2002  . Urinary frequency 10/01/2012   No past surgical history on file. Family History  Problem Relation Age of Onset  . Congestive Heart Failure Mother   . Diabetes Father   . Heart  disease Father   . Cancer Sister        lung   . Kidney disease Sister    Social History   Socioeconomic History  . Marital status: Widowed    Spouse name: Not on file  . Number of children: Not on file  . Years of education: Not on file  . Highest education level: Not on file  Occupational History  . Occupation: retired   Scientific laboratory technician  . Financial resource strain: Not hard at all  . Food insecurity:    Worry: Never true    Inability: Never true  . Transportation needs:    Medical: No    Non-medical: No  Tobacco Use  . Smoking status: Never Smoker  . Smokeless tobacco: Never Used  Substance and Sexual Activity  . Alcohol use: Yes    Comment: 2oz a week  . Drug use: No  . Sexual activity: Never  Lifestyle  . Physical activity:    Days per week: 0 days    Minutes per session: 0 min  . Stress: Not at all  Relationships  . Social connections:    Talks on phone: More than three times a week    Gets together: More than three times a week    Attends religious service: Never    Active member of club or organization: No    Attends meetings of clubs or organizations: Never    Relationship status: Widowed  Other Topics Concern  . Not on file  Social History Narrative   Lives at [facility] since 02/1992   Widowed   Living Will, DNR   Retired Futures trader with walking, power wheelchair   Exercise: none    Outpatient Encounter Medications as of 02/27/2018  Medication Sig  . Acetaminophen 500 MG coapsule Take by mouth. Take 2 tablets once daily as needed  . antiseptic oral rinse (BIOTENE) LIQD 15 mLs 4 (four) times daily by Mouth Rinse route.  . doxazosin (CARDURA) 2 MG tablet Take 1 tablet (2 mg total) by mouth every morning.  . Eyelid Cleansers (OCUSOFT EYELID CLEANSING) PADS Apply topically as needed.  . furosemide (LASIX) 20 MG tablet Take 1.5 tablets by mouth on Monday, Wednesday and Friday ongoing  . levothyroxine (SYNTHROID, LEVOTHROID) 25 MCG tablet Take 1  tablet (25 mcg total) by mouth daily before breakfast.  . metoprolol succinate (TOPROL-XL) 25 MG 24 hr tablet Take 25 mg by mouth daily.   . metoprolol tartrate (LOPRESSOR) 25 MG tablet TAKE ONE-HALF (1/2) TABLET AS NEEDED FOR PALPITATIONS  . omeprazole (PRILOSEC OTC) 20 MG tablet Take 20 mg 2 (two) times daily by mouth.   . ondansetron (ZOFRAN) 4 MG tablet Take 4 mg by mouth every 6 (six) hours as needed for nausea or vomiting.  . potassium chloride 20 MEQ/15ML (10%) SOLN   . trolamine salicylate (ASPERCREME) 10 % cream Apply 1 application topically 2 (two) times daily as needed.  . vitamin B-12 (CYANOCOBALAMIN) 1000 MCG tablet Take 1,000 mcg by mouth daily.   No facility-administered encounter medications on file as of 02/27/2018.  Activities of Daily Living In your present state of health, do you have any difficulty performing the following activities: 02/27/2018  Hearing? N  Vision? N  Difficulty concentrating or making decisions? N  Walking or climbing stairs? Y  Dressing or bathing? N  Doing errands, shopping? N  Preparing Food and eating ? N  Using the Toilet? N  In the past six months, have you accidently leaked urine? Y  Comment occational incontinence  Do you have problems with loss of bowel control? Y  Comment occassional incontinence  Managing your Medications? Y  Managing your Finances? Y  Housekeeping or managing your Housekeeping? Y  Some recent data might be hidden    Patient Care Team: Gayland Curry, DO as PCP - General (Geriatric Medicine) [facility] Derrill Memo, Nicki Reaper, DDS as Consulting Physician (Oral Surgery)   Assessment:   This is a routine wellness examination for Gary [name].  Exercise Activities and Dietary recommendations Current Exercise Habits: The patient does not participate in regular exercise at present, Exercise limited by: None identified  Goals    None      Fall Risk Fall Risk  02/27/2018 12/27/2017 02/21/2017 10/26/2016  07/13/2016  Falls in the past year? No No Yes No No  Number falls in past yr: - - 2 or more - -  Injury with Fall? - - No - -   Is the patient's home free of loose throw rugs in walkways, pet beds, electrical cords, etc?   yes      Grab bars in the bathroom? yes      Handrails on the stairs?   yes      Adequate lighting?   yes  Depression Screen PHQ 2/9 Scores 02/27/2018 12/27/2017 02/21/2017 10/26/2016  PHQ - 2 Score 0 0 0 0    Cognitive Function MMSE - Mini Mental State Exam 01/04/2018 11/24/2016 05/21/2014  Orientation to time 5 5 5   Orientation to Place 5 5 5   Registration 3 3 3   Attention/ Calculation 4 5 5   Recall 3 3 3   Language- name 2 objects 2 2 2   Language- repeat 1 1 1   Language- follow 3 step command 3 3 3   Language- read & follow direction 1 1 1   Write a sentence 1 1 1   Copy design 1 1 1   Total score 29 30 30         Immunization History  Administered Date(s) Administered  . Influenza Inj Mdck Quad Pf 06/23/2016  . Influenza-Unspecified 06/11/2013, 06/23/2014, 06/25/2015, 06/29/2017  . Pneumococcal Conjugate-13 09/09/2015  . Pneumococcal Polysaccharide-23 09/05/2008  . Td 09/05/1978  . Tdap 02/23/2017  . Zoster Recombinat (Shingrix) 10/02/2017    Qualifies for Shingles Vaccine? Yes, waiting for second shot  Screening Tests Health Maintenance  Topic Date Due  . INFLUENZA VACCINE  04/05/2018  . TETANUS/TDAP  02/24/2027  . PNA vac Low Risk Adult  Completed   Cancer Screenings: Lung: Low Dose CT Chest recommended if Age 67-80 years, 30 pack-year currently smoking OR have quit w/in 15years. Patient does not qualify. Colorectal: up to date  Additional Screenings:  Hepatitis C Screening:declined      Plan:    I have personally reviewed and addressed the Medicare Annual Wellness questionnaire and have noted the following in the patient's chart:  A. Medical and social history B. Use of alcohol, tobacco or illicit drugs  C. Current medications and  supplements D. Functional ability and status E.  Nutritional status F.  Physical activity G. Advance  directives H. List of other physicians I.  Hospitalizations, surgeries, and ER visits in previous 12 months J.  [place] to include hearing, vision, cognitive, depression L. Referrals and appointments - none  In addition, I have reviewed and discussed with patient certain preventive protocols, quality metrics, and best practice recommendations. A written personalized care plan for preventive services as well as general preventive health recommendations were provided to patient.  See attached scanned questionnaire for additional information.   Signed,   Tyson Dense, RN Nurse Health Advisor  Patient Concerns: None

## 2018-05-15 DIAGNOSIS — R0602 Shortness of breath: Secondary | ICD-10-CM | POA: Diagnosis not present

## 2018-05-15 DIAGNOSIS — F05 Delirium due to known physiological condition: Secondary | ICD-10-CM | POA: Diagnosis not present

## 2018-05-15 DIAGNOSIS — D649 Anemia, unspecified: Secondary | ICD-10-CM | POA: Diagnosis not present

## 2018-05-15 DIAGNOSIS — B370 Candidal stomatitis: Secondary | ICD-10-CM | POA: Diagnosis not present

## 2018-05-15 LAB — CBC AND DIFFERENTIAL
HCT: 39 — AB (ref 41–53)
Hemoglobin: 12.7 — AB (ref 13.5–17.5)
Platelets: 139 — AB (ref 150–399)
WBC: 4.5

## 2018-05-15 LAB — BASIC METABOLIC PANEL
BUN: 35 — AB (ref 4–21)
Creatinine: 1.9 — AB (ref 0.6–1.3)
Glucose: 137
Potassium: 4.1 (ref 3.4–5.3)
Sodium: 141 (ref 137–147)

## 2018-05-16 ENCOUNTER — Encounter: Payer: Self-pay | Admitting: Internal Medicine

## 2018-05-16 ENCOUNTER — Non-Acute Institutional Stay: Payer: Medicare Other | Admitting: Internal Medicine

## 2018-05-16 VITALS — BP 140/70 | HR 56 | Temp 98.5°F | Ht 72.0 in | Wt 197.0 lb

## 2018-05-16 DIAGNOSIS — K591 Functional diarrhea: Secondary | ICD-10-CM

## 2018-05-16 DIAGNOSIS — E039 Hypothyroidism, unspecified: Secondary | ICD-10-CM | POA: Diagnosis not present

## 2018-05-16 DIAGNOSIS — G459 Transient cerebral ischemic attack, unspecified: Secondary | ICD-10-CM | POA: Diagnosis not present

## 2018-05-16 DIAGNOSIS — R531 Weakness: Secondary | ICD-10-CM | POA: Diagnosis not present

## 2018-05-16 DIAGNOSIS — I509 Heart failure, unspecified: Secondary | ICD-10-CM

## 2018-05-16 DIAGNOSIS — I482 Chronic atrial fibrillation, unspecified: Secondary | ICD-10-CM

## 2018-05-16 DIAGNOSIS — N3945 Continuous leakage: Secondary | ICD-10-CM

## 2018-05-16 DIAGNOSIS — N401 Enlarged prostate with lower urinary tract symptoms: Secondary | ICD-10-CM

## 2018-05-16 DIAGNOSIS — R350 Frequency of micturition: Secondary | ICD-10-CM | POA: Diagnosis not present

## 2018-05-16 MED ORDER — APIXABAN 2.5 MG PO TABS: 2.5000 mg | ORAL_TABLET | Freq: Two times a day (BID) | ORAL | 5 refills | Status: DC

## 2018-05-16 MED ORDER — FUROSEMIDE 40 MG PO TABS: 40.0000 mg | ORAL_TABLET | Freq: Every day | ORAL | 5 refills | Status: DC
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
## 2018-05-16 NOTE — Addendum Note (Signed)
Addended by: Despina Hidden on: 05/16/2018 12:57 PM   Modules accepted: Orders

## 2018-05-21 ENCOUNTER — Other Ambulatory Visit: Payer: Self-pay | Admitting: Internal Medicine

## 2018-05-21 ENCOUNTER — Other Ambulatory Visit (HOSPITAL_COMMUNITY): Payer: Self-pay | Admitting: Internal Medicine

## 2018-05-21 ENCOUNTER — Ambulatory Visit (HOSPITAL_COMMUNITY): Payer: Medicare Other | Attending: Cardiology

## 2018-05-21 DIAGNOSIS — R609 Edema, unspecified: Secondary | ICD-10-CM

## 2018-05-21 DIAGNOSIS — I4891 Unspecified atrial fibrillation: Secondary | ICD-10-CM | POA: Insufficient documentation

## 2018-05-21 DIAGNOSIS — I509 Heart failure, unspecified: Secondary | ICD-10-CM | POA: Diagnosis not present

## 2018-05-21 DIAGNOSIS — I071 Rheumatic tricuspid insufficiency: Secondary | ICD-10-CM | POA: Insufficient documentation

## 2018-05-21 DIAGNOSIS — R600 Localized edema: Secondary | ICD-10-CM | POA: Insufficient documentation

## 2018-05-23 DIAGNOSIS — I509 Heart failure, unspecified: Secondary | ICD-10-CM | POA: Diagnosis not present

## 2018-05-23 DIAGNOSIS — E039 Hypothyroidism, unspecified: Secondary | ICD-10-CM | POA: Diagnosis not present

## 2018-05-23 DIAGNOSIS — D649 Anemia, unspecified: Secondary | ICD-10-CM | POA: Diagnosis not present

## 2018-05-23 LAB — TSH: TSH: 4.63 (ref <=5.90)

## 2018-05-25 DIAGNOSIS — Z79899 Other long term (current) drug therapy: Secondary | ICD-10-CM | POA: Diagnosis not present

## 2018-05-29 ENCOUNTER — Other Ambulatory Visit: Payer: Self-pay | Admitting: Internal Medicine

## 2018-05-29 ENCOUNTER — Encounter: Payer: Self-pay | Admitting: Internal Medicine

## 2018-05-29 DIAGNOSIS — E039 Hypothyroidism, unspecified: Secondary | ICD-10-CM

## 2018-05-29 NOTE — Progress Notes (Signed)
TSH returned high.  Will adjust levothyroxine appropriately.

## 2018-05-30 MED ORDER — LEVOTHYROXINE SODIUM 50 MCG PO CAPS: 1.0000 | ORAL_CAPSULE | Freq: Every day | ORAL | 3 refills | Status: AC

## 2018-06-07 ENCOUNTER — Other Ambulatory Visit: Payer: Self-pay | Admitting: Cardiology

## 2018-06-07 DIAGNOSIS — I359 Nonrheumatic aortic valve disorder, unspecified: Secondary | ICD-10-CM

## 2018-06-21 ENCOUNTER — Ambulatory Visit (HOSPITAL_COMMUNITY): Payer: Medicare Other

## 2018-06-22 ENCOUNTER — Encounter: Payer: Self-pay | Admitting: *Deleted

## 2018-06-26 DIAGNOSIS — G6289 Other specified polyneuropathies: Secondary | ICD-10-CM | POA: Diagnosis not present

## 2018-06-26 DIAGNOSIS — I482 Chronic atrial fibrillation, unspecified: Secondary | ICD-10-CM | POA: Diagnosis not present

## 2018-06-26 DIAGNOSIS — I9589 Other hypotension: Secondary | ICD-10-CM | POA: Diagnosis not present

## 2018-06-26 DIAGNOSIS — M6389 Disorders of muscle in diseases classified elsewhere, multiple sites: Secondary | ICD-10-CM | POA: Diagnosis not present

## 2018-06-26 DIAGNOSIS — K598 Other specified functional intestinal disorders: Secondary | ICD-10-CM | POA: Diagnosis not present

## 2018-06-26 DIAGNOSIS — R296 Repeated falls: Secondary | ICD-10-CM | POA: Diagnosis not present

## 2018-06-27 DIAGNOSIS — G6289 Other specified polyneuropathies: Secondary | ICD-10-CM | POA: Diagnosis not present

## 2018-06-27 DIAGNOSIS — M6389 Disorders of muscle in diseases classified elsewhere, multiple sites: Secondary | ICD-10-CM | POA: Diagnosis not present

## 2018-06-27 DIAGNOSIS — I9589 Other hypotension: Secondary | ICD-10-CM | POA: Diagnosis not present

## 2018-06-27 DIAGNOSIS — I482 Chronic atrial fibrillation, unspecified: Secondary | ICD-10-CM | POA: Diagnosis not present

## 2018-06-27 DIAGNOSIS — K598 Other specified functional intestinal disorders: Secondary | ICD-10-CM | POA: Diagnosis not present

## 2018-06-27 DIAGNOSIS — R296 Repeated falls: Secondary | ICD-10-CM | POA: Diagnosis not present

## 2018-06-28 DIAGNOSIS — K598 Other specified functional intestinal disorders: Secondary | ICD-10-CM | POA: Diagnosis not present

## 2018-06-28 DIAGNOSIS — I482 Chronic atrial fibrillation, unspecified: Secondary | ICD-10-CM | POA: Diagnosis not present

## 2018-06-28 DIAGNOSIS — G6289 Other specified polyneuropathies: Secondary | ICD-10-CM | POA: Diagnosis not present

## 2018-06-28 DIAGNOSIS — I9589 Other hypotension: Secondary | ICD-10-CM | POA: Diagnosis not present

## 2018-06-28 DIAGNOSIS — M6389 Disorders of muscle in diseases classified elsewhere, multiple sites: Secondary | ICD-10-CM | POA: Diagnosis not present

## 2018-06-28 DIAGNOSIS — R296 Repeated falls: Secondary | ICD-10-CM | POA: Diagnosis not present

## 2018-07-02 DIAGNOSIS — I482 Chronic atrial fibrillation, unspecified: Secondary | ICD-10-CM | POA: Diagnosis not present

## 2018-07-02 DIAGNOSIS — M6389 Disorders of muscle in diseases classified elsewhere, multiple sites: Secondary | ICD-10-CM | POA: Diagnosis not present

## 2018-07-02 DIAGNOSIS — K598 Other specified functional intestinal disorders: Secondary | ICD-10-CM | POA: Diagnosis not present

## 2018-07-02 DIAGNOSIS — R296 Repeated falls: Secondary | ICD-10-CM | POA: Diagnosis not present

## 2018-07-02 DIAGNOSIS — I9589 Other hypotension: Secondary | ICD-10-CM | POA: Diagnosis not present

## 2018-07-02 DIAGNOSIS — G6289 Other specified polyneuropathies: Secondary | ICD-10-CM | POA: Diagnosis not present

## 2018-07-03 DIAGNOSIS — G6289 Other specified polyneuropathies: Secondary | ICD-10-CM | POA: Diagnosis not present

## 2018-07-03 DIAGNOSIS — M6389 Disorders of muscle in diseases classified elsewhere, multiple sites: Secondary | ICD-10-CM | POA: Diagnosis not present

## 2018-07-03 DIAGNOSIS — K598 Other specified functional intestinal disorders: Secondary | ICD-10-CM | POA: Diagnosis not present

## 2018-07-03 DIAGNOSIS — I9589 Other hypotension: Secondary | ICD-10-CM | POA: Diagnosis not present

## 2018-07-03 DIAGNOSIS — R296 Repeated falls: Secondary | ICD-10-CM | POA: Diagnosis not present

## 2018-07-03 DIAGNOSIS — I482 Chronic atrial fibrillation, unspecified: Secondary | ICD-10-CM | POA: Diagnosis not present

## 2018-07-05 DIAGNOSIS — K598 Other specified functional intestinal disorders: Secondary | ICD-10-CM | POA: Diagnosis not present

## 2018-07-05 DIAGNOSIS — G6289 Other specified polyneuropathies: Secondary | ICD-10-CM | POA: Diagnosis not present

## 2018-07-05 DIAGNOSIS — I9589 Other hypotension: Secondary | ICD-10-CM | POA: Diagnosis not present

## 2018-07-05 DIAGNOSIS — I482 Chronic atrial fibrillation, unspecified: Secondary | ICD-10-CM | POA: Diagnosis not present

## 2018-07-05 DIAGNOSIS — R296 Repeated falls: Secondary | ICD-10-CM | POA: Diagnosis not present

## 2018-07-05 DIAGNOSIS — Z23 Encounter for immunization: Secondary | ICD-10-CM | POA: Diagnosis not present

## 2018-07-05 DIAGNOSIS — M6389 Disorders of muscle in diseases classified elsewhere, multiple sites: Secondary | ICD-10-CM | POA: Diagnosis not present

## 2018-07-09 DIAGNOSIS — R296 Repeated falls: Secondary | ICD-10-CM | POA: Diagnosis not present

## 2018-07-09 DIAGNOSIS — M6389 Disorders of muscle in diseases classified elsewhere, multiple sites: Secondary | ICD-10-CM | POA: Diagnosis not present

## 2018-07-09 DIAGNOSIS — I9589 Other hypotension: Secondary | ICD-10-CM | POA: Diagnosis not present

## 2018-07-09 DIAGNOSIS — K598 Other specified functional intestinal disorders: Secondary | ICD-10-CM | POA: Diagnosis not present

## 2018-07-09 DIAGNOSIS — G6289 Other specified polyneuropathies: Secondary | ICD-10-CM | POA: Diagnosis not present

## 2018-07-09 DIAGNOSIS — I482 Chronic atrial fibrillation, unspecified: Secondary | ICD-10-CM | POA: Diagnosis not present

## 2018-07-10 DIAGNOSIS — K598 Other specified functional intestinal disorders: Secondary | ICD-10-CM | POA: Diagnosis not present

## 2018-07-10 DIAGNOSIS — M6389 Disorders of muscle in diseases classified elsewhere, multiple sites: Secondary | ICD-10-CM | POA: Diagnosis not present

## 2018-07-10 DIAGNOSIS — R296 Repeated falls: Secondary | ICD-10-CM | POA: Diagnosis not present

## 2018-07-10 DIAGNOSIS — I482 Chronic atrial fibrillation, unspecified: Secondary | ICD-10-CM | POA: Diagnosis not present

## 2018-07-10 DIAGNOSIS — G6289 Other specified polyneuropathies: Secondary | ICD-10-CM | POA: Diagnosis not present

## 2018-07-10 DIAGNOSIS — I9589 Other hypotension: Secondary | ICD-10-CM | POA: Diagnosis not present

## 2018-07-11 DIAGNOSIS — I482 Chronic atrial fibrillation, unspecified: Secondary | ICD-10-CM | POA: Diagnosis not present

## 2018-07-11 DIAGNOSIS — K598 Other specified functional intestinal disorders: Secondary | ICD-10-CM | POA: Diagnosis not present

## 2018-07-11 DIAGNOSIS — M6389 Disorders of muscle in diseases classified elsewhere, multiple sites: Secondary | ICD-10-CM | POA: Diagnosis not present

## 2018-07-11 DIAGNOSIS — R296 Repeated falls: Secondary | ICD-10-CM | POA: Diagnosis not present

## 2018-07-11 DIAGNOSIS — G6289 Other specified polyneuropathies: Secondary | ICD-10-CM | POA: Diagnosis not present

## 2018-07-11 DIAGNOSIS — I9589 Other hypotension: Secondary | ICD-10-CM | POA: Diagnosis not present

## 2018-07-12 ENCOUNTER — Ambulatory Visit: Payer: Medicare Other | Admitting: Cardiology

## 2018-07-17 DIAGNOSIS — K598 Other specified functional intestinal disorders: Secondary | ICD-10-CM | POA: Diagnosis not present

## 2018-07-17 DIAGNOSIS — R296 Repeated falls: Secondary | ICD-10-CM | POA: Diagnosis not present

## 2018-07-17 DIAGNOSIS — G6289 Other specified polyneuropathies: Secondary | ICD-10-CM | POA: Diagnosis not present

## 2018-07-17 DIAGNOSIS — I482 Chronic atrial fibrillation, unspecified: Secondary | ICD-10-CM | POA: Diagnosis not present

## 2018-07-17 DIAGNOSIS — I9589 Other hypotension: Secondary | ICD-10-CM | POA: Diagnosis not present

## 2018-07-17 DIAGNOSIS — M6389 Disorders of muscle in diseases classified elsewhere, multiple sites: Secondary | ICD-10-CM | POA: Diagnosis not present

## 2018-07-17 IMAGING — DX DG CHEST 2V
2 series · 2 of 2 positions shown · non-contrast
Comparison: 04/11/2016

CLINICAL DATA: Shortness of breath.

EXAM:
CHEST  2 VIEW

[chest lat]
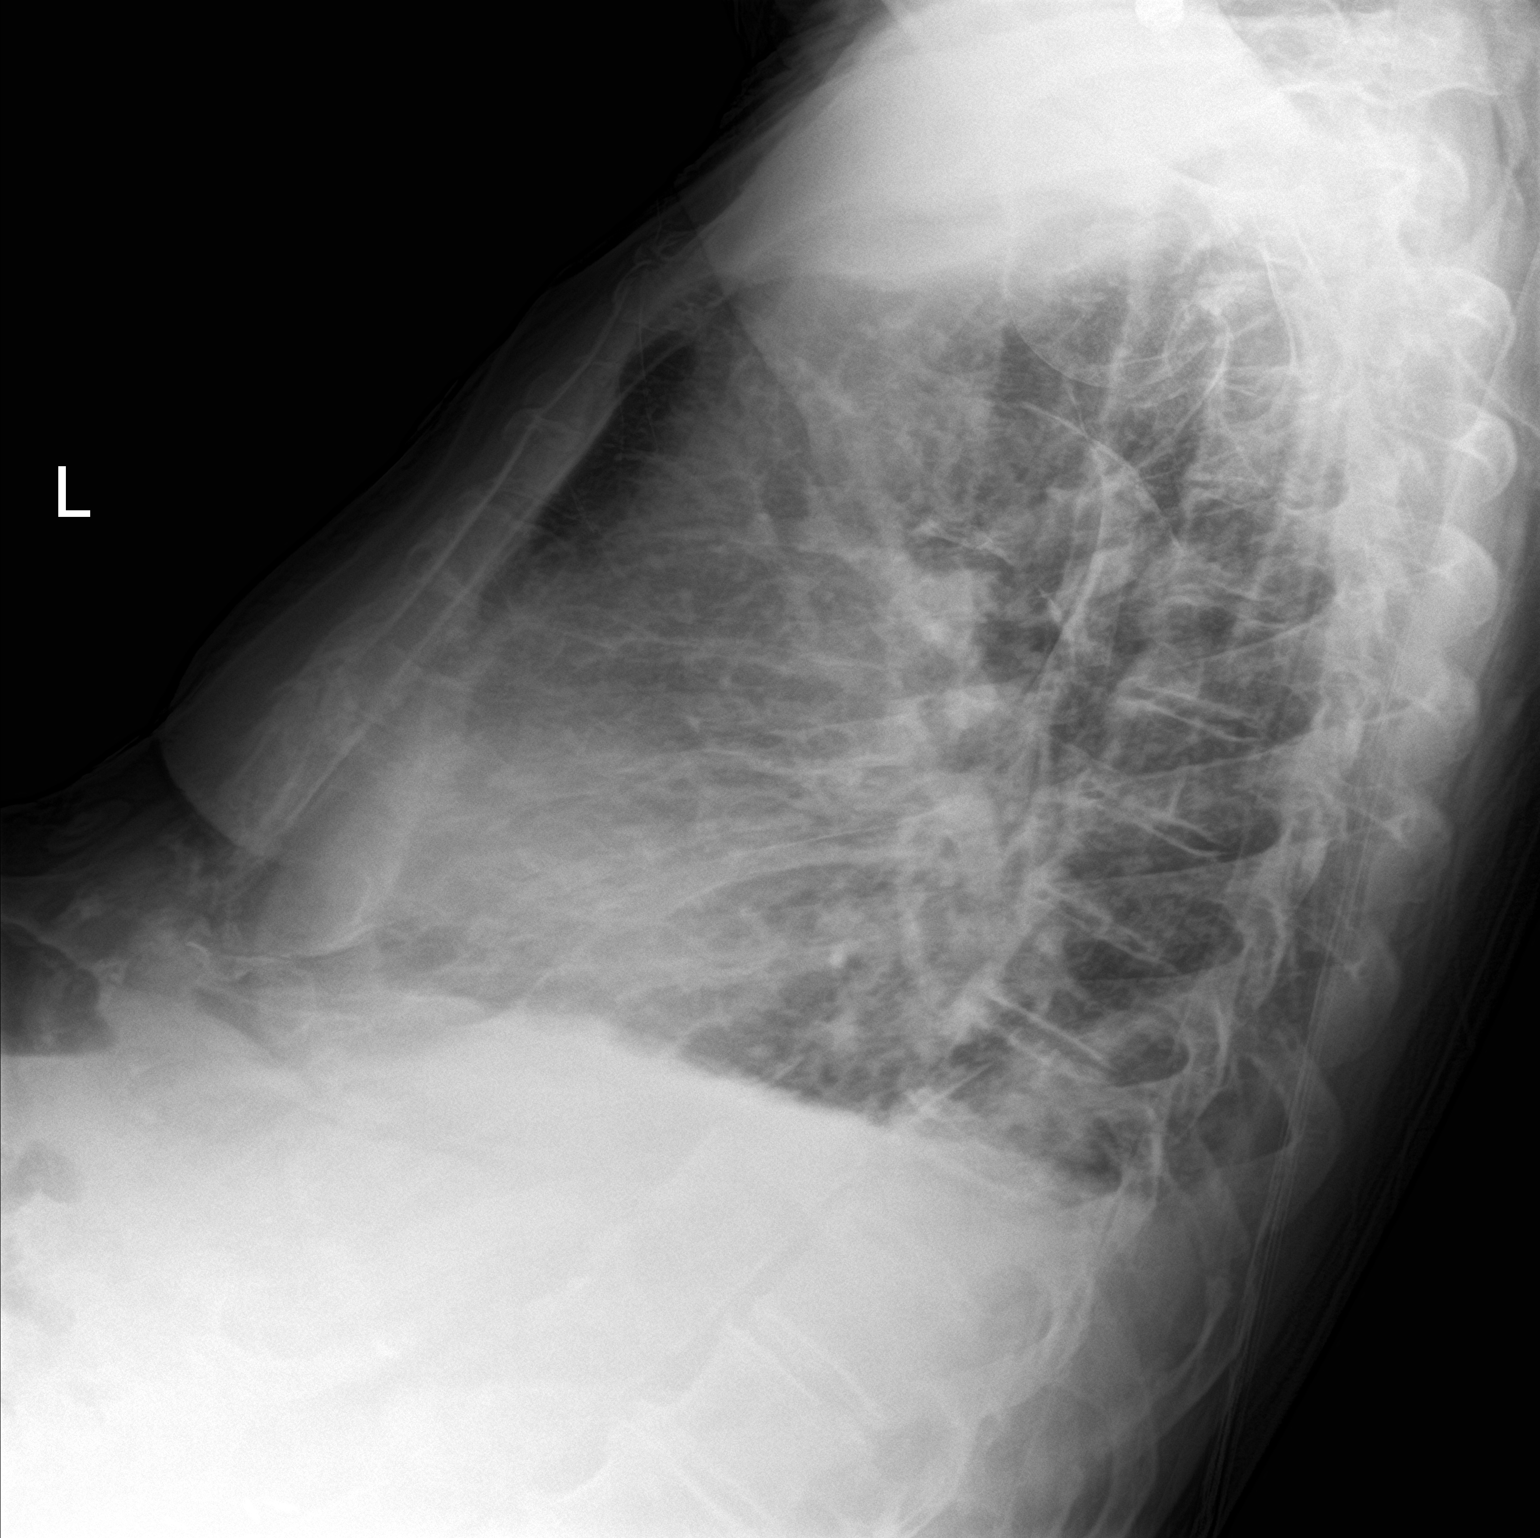

[chest ap]
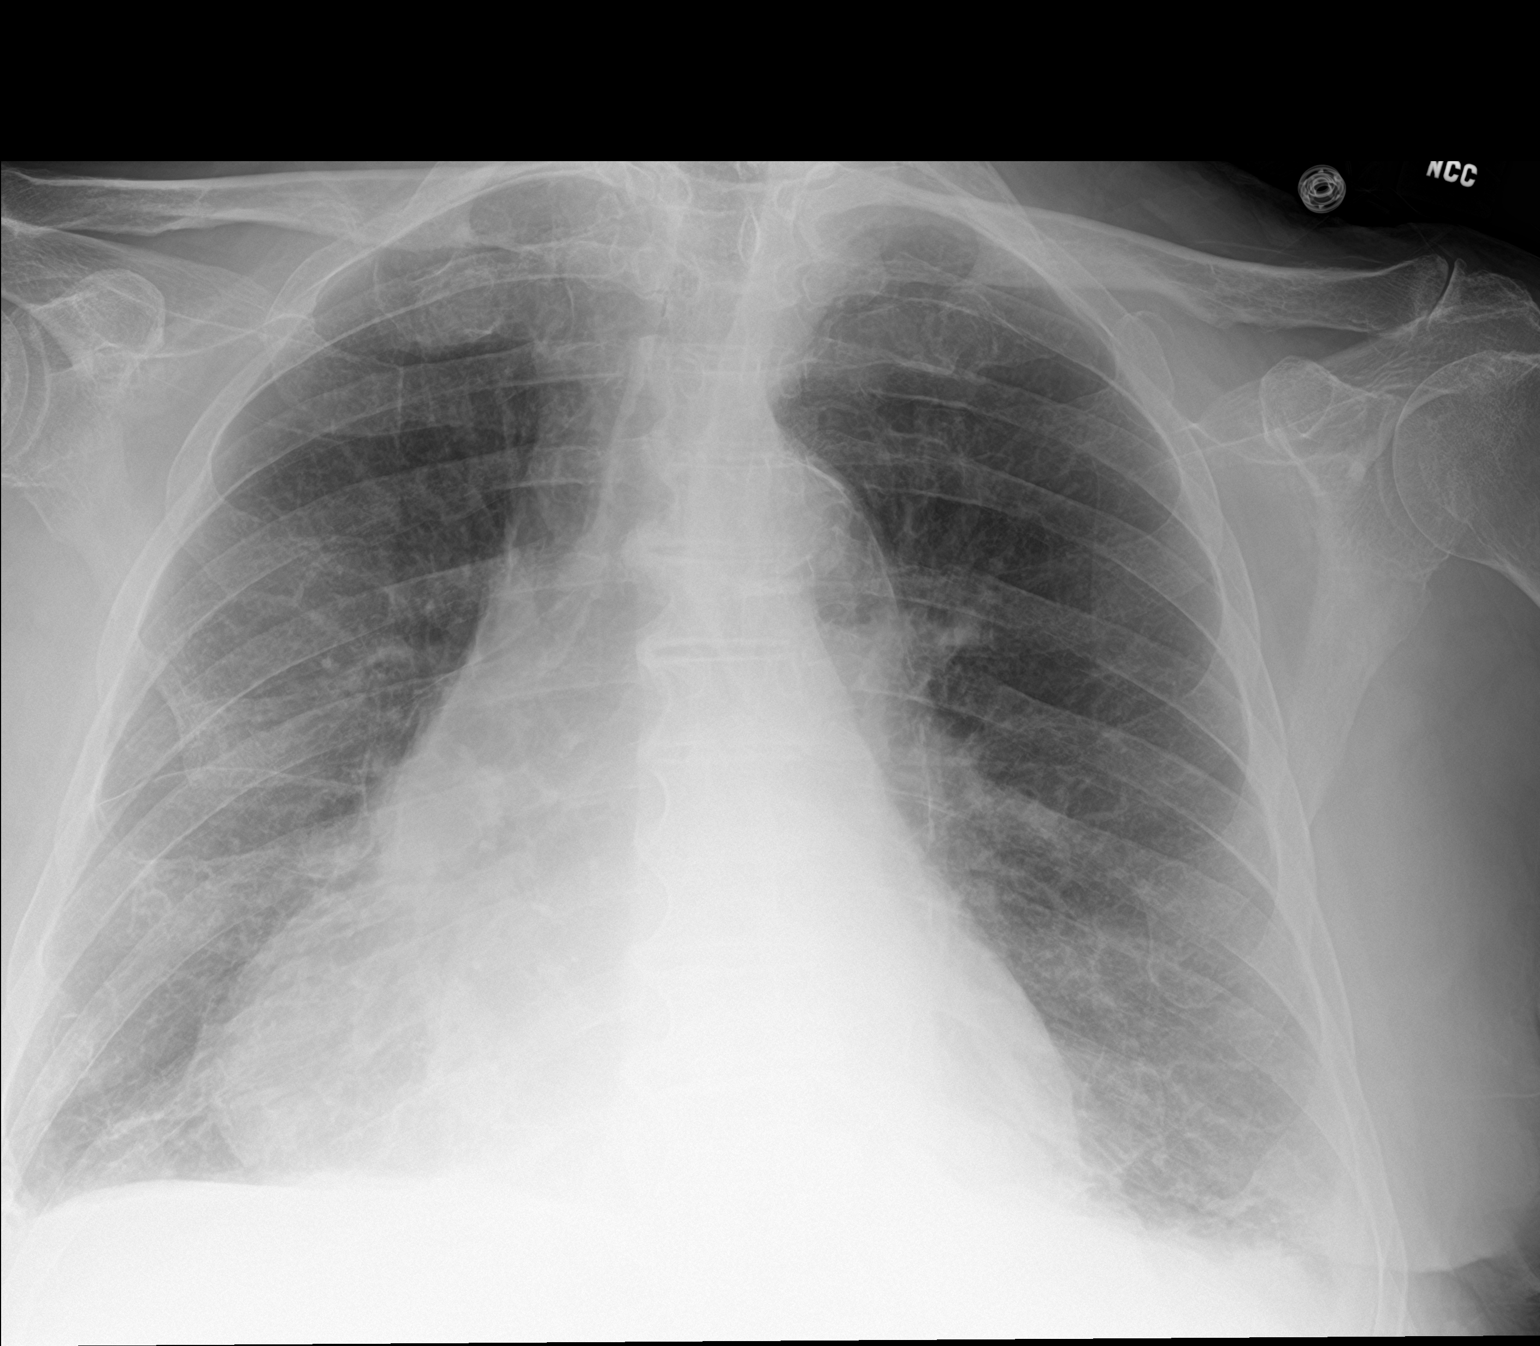

[2 of 2 positions shown; findings below may reference images not displayed]

FINDINGS: AP and lateral views of the chest. The cardio pericardial silhouette
is enlarged. Interstitial markings are diffusely coarsened with
chronic features. Basilar atelectasis bilaterally with tiny left
pleural effusion. Bones are diffusely demineralized.
IMPRESSION: Cardiomegaly with underlying chronic interstitial changes.

Basilar atelectasis with tiny left pleural effusion.

## 2018-07-18 ENCOUNTER — Encounter: Payer: Self-pay | Admitting: Internal Medicine

## 2018-07-18 ENCOUNTER — Non-Acute Institutional Stay: Payer: Medicare Other | Admitting: Internal Medicine

## 2018-07-18 VITALS — BP 118/60 | HR 60 | Temp 97.9°F | Ht 72.0 in | Wt 196.0 lb

## 2018-07-18 DIAGNOSIS — R531 Weakness: Secondary | ICD-10-CM | POA: Diagnosis not present

## 2018-07-18 DIAGNOSIS — I509 Heart failure, unspecified: Secondary | ICD-10-CM | POA: Diagnosis not present

## 2018-07-18 DIAGNOSIS — M1711 Unilateral primary osteoarthritis, right knee: Secondary | ICD-10-CM | POA: Insufficient documentation

## 2018-07-18 DIAGNOSIS — E739 Lactose intolerance, unspecified: Secondary | ICD-10-CM | POA: Diagnosis not present

## 2018-07-18 DIAGNOSIS — L918 Other hypertrophic disorders of the skin: Secondary | ICD-10-CM

## 2018-07-18 DIAGNOSIS — N183 Chronic kidney disease, stage 3 unspecified: Secondary | ICD-10-CM

## 2018-07-18 DIAGNOSIS — I359 Nonrheumatic aortic valve disorder, unspecified: Secondary | ICD-10-CM | POA: Diagnosis not present

## 2018-07-18 DIAGNOSIS — E039 Hypothyroidism, unspecified: Secondary | ICD-10-CM | POA: Diagnosis not present

## 2018-07-18 NOTE — Progress Notes (Signed)
Location:  Occupational psychologist of Service:  Clinic (12)  Provider: Rawleigh Rode L. Mariea Clonts, D.O., C.M.D.  Code Status: DNR Goals of Care:  Advanced Directives 07/18/2018  Does Patient Have a Medical Advance Directive? Yes  Type of Advance Directive Out of facility DNR (pink MOST or yellow form);Healthcare Power of Attorney  Does patient want to make changes to medical advance directive? No - Patient declined  Copy of [place] in Chart? Yes - validated most recent copy scanned in chart (See row information)  Pre-existing out of facility DNR order (yellow form or pink MOST form) Yellow form placed in chart (order not valid for inpatient use)     Chief Complaint  Patient presents with  . Medical Management of Chronic Issues    2 month follow up/ Daughter [name] is with him     HPI: Patient is a 84 y.o. male seen today for medical management of chronic diseases.    Melissa is bending his ear on compression hose.  He does not want them.  He asks if the hose will extend his life or make him more comfortable. He likes to dress on his timeline.  He's not bothered by the swelling.  He has a tag on his fanny.  It's painful from time to time.  It will wake him if he sleeps in his recliner which he does occasionally.    His indigestion is better with different milk.  His appetite is better and he's gained more weight than he'd like.    Knee does bother him at night 90% of the time.  Right is weaker when walking and he's a little shakier with his walker.  He wakes up more than once due to his knees hurting.  He'll sleep in his easy chair a couple hours and then go back to bed.  He uses the aspercreme if he wakes up with hit.  Says he will try it before bed.  He doesn't like the greasy residue.   He is trying out a power wheelchair. He likes what he has.  He feels like it's harder to get on and off the wheelchair than the scooter.  Says eating is the only  advantage of the power chair, but still has to lean over to eat, also. The purpose of a new device was to avoid transferring as much.    When he first stands, there's a weakness not a stiffness so much.   He does not want to to PT for this.   He thinks it will only make him do things he does not want to do.    Surprisingly little palpitations. Sometimes feels like he needs to breathe more deeply.    He is trying to reduce salt.   He is trying to drink water better.    Past Medical History:  Diagnosis Date  . Abnormality of gait 02/2010  . Anal and rectal polyp 1980  . Atrial fibrillation ([place]) 11/2009  . Chronic kidney disease, stage III (moderate) ([place]) 08/07/2013  . Deviated nasal septum 11/2009  . Diaphragmatic hernia without mention of obstruction or gangrene 2007  . Diarrhea 02/2010  . Dizziness and giddiness 2009  . Edema 2010  . Essential and other specified forms of tremor 2007  . 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 Hyperglycemia 01/02/2013  . Hypertrophy of prostate without urinary obstruction and other lower urinary tract symptoms (LUTS) 2002  . Insomnia with sleep apnea, unspecified  02/2010  . Irritable bowel syndrome 11/14/2011  . Long term (current) use of anticoagulants 11/2009  . Other abnormal blood chemistry 02/09/2009  . Other B-complex deficiencies 2008  . Other dyspnea and respiratory abnormality 11/2009  . Other malaise and fatigue 2007  . Pain in joint, lower leg 02/2010  . Palpitations 2007  . Reflux esophagitis 2007  . Sebaceous cyst 2008  . Tension headache 2009  . Undiagnosed cardiac murmurs 2002  . Unspecified constipation 07/04/2012  . Unspecified essential hypertension 2007  . Unspecified hereditary and idiopathic peripheral neuropathy 02/2010  . Unspecified transient cerebral ischemia 2002  . Urinary frequency 10/01/2012    History reviewed. No pertinent surgical history.  Allergies  Allergen Reactions  . Plavix [Clopidogrel  Bisulfate]     indigestion    Outpatient Encounter Medications as of 07/18/2018  Medication Sig  . Acetaminophen 500 MG coapsule Take by mouth. Take 2 tablets once daily as needed  . antiseptic oral rinse (BIOTENE) LIQD 15 mLs 4 (four) times daily by Mouth Rinse route.  Marland Kitchen apixaban (ELIQUIS) 2.5 MG TABS tablet Take 1 tablet (2.5 mg total) by mouth 2 (two) times daily.  Marland Kitchen doxazosin (CARDURA) 2 MG tablet Take 1 tablet (2 mg total) by mouth every morning.  . Eyelid Cleansers (OCUSOFT EYELID CLEANSING) PADS Apply topically as needed.  . furosemide (LASIX) 40 MG tablet Take 40 mg by mouth daily. NO LASIX ON Saturday OR Sunday  . Levothyroxine Sodium 50 MCG CAPS Take 1 capsule (50 mcg total) by mouth daily before breakfast.  . metoprolol succinate (TOPROL-XL) 25 MG 24 hr tablet Take 12.5 mg by mouth as needed (For palpitations).   Marland Kitchen omeprazole (PRILOSEC OTC) 20 MG tablet Take 20 mg by mouth 2 (two) times daily. Before breakfast/dinner  . ondansetron (ZOFRAN) 4 MG tablet Take 4 mg by mouth every 6 (six) hours as needed for nausea or vomiting.  . Polyvinyl Alcohol-Povidone PF 1.4-0.6 % SOLN Apply 1.4 % to eye 2 (two) times daily. One drop to both eyes BID  . potassium chloride 20 MEQ/15ML (10%) SOLN Take 15 mLs by mouth daily. On Mon thru Fri, none on sat or sun  . sodium chloride (OCEAN) 0.65 % nasal spray Place 1 spray into the nose as needed for congestion (1 spray each nostril up to 3 days for nasal congestion.).  Marland Kitchen trolamine salicylate (ASPERCREME) 10 % cream Apply 1 application topically 2 (two) times daily as needed.  . vitamin B-12 (CYANOCOBALAMIN) 1000 MCG tablet Take 1,000 mcg by mouth daily.   No facility-administered encounter medications on file as of 07/18/2018.     Review of Systems:  Review of Systems  Constitutional: Positive for malaise/fatigue. Negative for chills and fever.  HENT: Negative for hearing loss.   Eyes: Negative for blurred vision.       Glasses  Respiratory:  Negative for cough and shortness of breath.   Cardiovascular: Positive for leg swelling. Negative for chest pain and palpitations.  Gastrointestinal: Negative for abdominal pain, blood in stool, constipation, diarrhea and melena.       Loose bms better with lactose free milk  Genitourinary: Negative for dysuria.  Musculoskeletal: Positive for joint pain. Negative for falls.       Unsteady on standing  Skin: Negative for itching and rash.       Skin tags on his buttocks that are irritating depending on how he sits  Neurological: Negative for dizziness and loss of consciousness.  Psychiatric/Behavioral: Positive for memory loss.  Some mild short term memory difficulty but overall quite clear    Health Maintenance  Topic Date Due  . TETANUS/TDAP  02/24/2027  . INFLUENZA VACCINE  Completed  . PNA vac Low Risk Adult  Completed    Physical Exam: Vitals:   07/18/18 1350  BP: 118/60  Pulse: 60  Temp: 97.9 F (36.6 C)  TempSrc: Oral  SpO2: 97%  Weight: 196 lb (88.9 kg)  Height: 6' (1.829 m)   Body mass index is 26.58 kg/m. Physical Exam  Constitutional: He is oriented to person, place, and time. He appears well-developed and well-nourished. No distress.  Cardiovascular: Normal rate, regular rhythm and intact distal pulses.  Murmur heard. Pulmonary/Chest: Effort normal and breath sounds normal. He has no rales.  Abdominal: Bowel sounds are normal.  Musculoskeletal: Normal range of motion.  Ambulates short distances with walker, uses power scooter long distances  Neurological: He is alert and oriented to person, place, and time.  Skin: Skin is warm and dry. Capillary refill takes less than 2 seconds.  4 skin tags on buttocks, also one noted near groin area anteriorly  Psychiatric: He has a normal mood and affect.    Labs reviewed: Basic Metabolic Panel: Recent Labs    12/25/17 0800 05/15/18 0700 05/23/18  NA 144 141  --   K 4.5 4.1  --   BUN 31* 35*  --     CREATININE 1.4* 1.9*  --   TSH  --   --  4.63   Liver Function Tests: No results for input(s): AST, ALT, ALKPHOS, BILITOT, PROT, ALBUMIN in the last 8760 hours. No results for input(s): LIPASE, AMYLASE in the last 8760 hours. No results for input(s): AMMONIA in the last 8760 hours. CBC: Recent Labs    12/25/17 0800 05/15/18 0700  WBC 4.4 4.5  HGB 12.6* 12.7*  HCT 39* 39*  PLT 139* 139*   Lipid Panel: No results for input(s): CHOL, HDL, LDLCALC, TRIG, CHOLHDL, LDLDIRECT in the last 8760 hours. Lab Results  Component Value Date   HGBA1C 5.3 03/01/2016    Assessment/Plan 1. Primary osteoarthritis of right knee -ongoing, getting worse and bothering him more often -recommended nightly application of the aspercreme before bed to prevent him from waking up in pain--he says he is willing to try this  2. Acquired hypothyroidism -f/u tsh needed due to dose adjustment at last appt  3. Chronic congestive heart failure, unspecified heart failure type (HCC) -seems stable the past couple of months -edema persists, but he won't agree to more lasix due to the urinary frequency and incontinence it causes -also did not want the limited aortic valve study done when his echo suggested valve pathology  4. Aortic valve disorder -noted on echo after I heard a murmur and was concerned this was causing the episodes of near syncope he had; however, he refuses further workup  5. Generalized weakness -ongoing, refuses PT, working with OT on power chair which he does not want to switch to--he wants to keep his scooter and walker set up  6. Lactose intolerance -newly noted and he's not had loose bms and incontinence since switching to lactose free milk -does miss ice cream so I suggested prn lactaid tablets with the first bite of milk products--his daughter was going to get him some  7. Chronic kidney disease, stage III (moderate) (HCC) -encouraged hydration, Avoid nephrotoxic agents like nsaids,  dose adjust renally excreted meds, hydrate.  8. Skin tags, multiple acquired -will refer to on site  derm here for removal or cryotherapy for these as they bother him when he sits on them a certain way  Labs/tests ordered:  tsh next draw Next appt:  11/21/2018   Abrianna Sidman L. Sunita Demond, D.O. [facility] [address], [place] [address] Cell Phone (Mon-Fri 8am-5pm):  [phone] On Call:  [phone] & follow prompts after 5pm & weekends Office Phone:  [phone] Office Fax:  [phone]

## 2018-07-19 DIAGNOSIS — I482 Chronic atrial fibrillation, unspecified: Secondary | ICD-10-CM | POA: Diagnosis not present

## 2018-07-19 DIAGNOSIS — R296 Repeated falls: Secondary | ICD-10-CM | POA: Diagnosis not present

## 2018-07-19 DIAGNOSIS — K598 Other specified functional intestinal disorders: Secondary | ICD-10-CM | POA: Diagnosis not present

## 2018-07-19 DIAGNOSIS — G6289 Other specified polyneuropathies: Secondary | ICD-10-CM | POA: Diagnosis not present

## 2018-07-19 DIAGNOSIS — M6389 Disorders of muscle in diseases classified elsewhere, multiple sites: Secondary | ICD-10-CM | POA: Diagnosis not present

## 2018-07-19 DIAGNOSIS — I9589 Other hypotension: Secondary | ICD-10-CM | POA: Diagnosis not present

## 2018-07-20 DIAGNOSIS — E039 Hypothyroidism, unspecified: Secondary | ICD-10-CM | POA: Diagnosis not present

## 2018-07-20 DIAGNOSIS — Z719 Counseling, unspecified: Secondary | ICD-10-CM | POA: Diagnosis not present

## 2018-07-20 LAB — TSH: TSH: 3.11 (ref 0.41–5.90)

## 2018-07-24 ENCOUNTER — Encounter: Payer: Self-pay | Admitting: Internal Medicine

## 2018-07-24 DIAGNOSIS — L859 Epidermal thickening, unspecified: Secondary | ICD-10-CM | POA: Diagnosis not present

## 2018-07-24 DIAGNOSIS — L821 Other seborrheic keratosis: Secondary | ICD-10-CM | POA: Diagnosis not present

## 2018-07-24 DIAGNOSIS — D485 Neoplasm of uncertain behavior of skin: Secondary | ICD-10-CM | POA: Diagnosis not present

## 2018-07-24 DIAGNOSIS — L570 Actinic keratosis: Secondary | ICD-10-CM | POA: Diagnosis not present

## 2018-08-09 DIAGNOSIS — L821 Other seborrheic keratosis: Secondary | ICD-10-CM | POA: Diagnosis not present

## 2018-08-09 DIAGNOSIS — L570 Actinic keratosis: Secondary | ICD-10-CM | POA: Diagnosis not present

## 2018-08-09 DIAGNOSIS — L859 Epidermal thickening, unspecified: Secondary | ICD-10-CM | POA: Diagnosis not present

## 2018-08-25 DIAGNOSIS — R0602 Shortness of breath: Secondary | ICD-10-CM | POA: Diagnosis not present

## 2018-11-17 ENCOUNTER — Encounter: Payer: Self-pay | Admitting: Internal Medicine

## 2018-11-21 ENCOUNTER — Other Ambulatory Visit: Payer: Self-pay

## 2018-11-21 ENCOUNTER — Non-Acute Institutional Stay: Payer: Medicare Other | Admitting: Internal Medicine

## 2018-11-21 ENCOUNTER — Encounter: Payer: Self-pay | Admitting: Internal Medicine

## 2018-11-21 VITALS — BP 128/60 | HR 68 | Temp 98.4°F | Ht 72.0 in | Wt 195.0 lb

## 2018-11-21 DIAGNOSIS — I4811 Longstanding persistent atrial fibrillation: Secondary | ICD-10-CM

## 2018-11-21 DIAGNOSIS — M1711 Unilateral primary osteoarthritis, right knee: Secondary | ICD-10-CM

## 2018-11-21 DIAGNOSIS — N183 Chronic kidney disease, stage 3 unspecified: Secondary | ICD-10-CM

## 2018-11-21 DIAGNOSIS — E039 Hypothyroidism, unspecified: Secondary | ICD-10-CM

## 2018-11-21 DIAGNOSIS — I509 Heart failure, unspecified: Secondary | ICD-10-CM | POA: Diagnosis not present

## 2018-11-21 DIAGNOSIS — K5901 Slow transit constipation: Secondary | ICD-10-CM | POA: Diagnosis not present

## 2018-11-21 NOTE — Progress Notes (Signed)
Location:  Occupational psychologist of Service:  Clinic (12)  Provider: Jordie Schreur L. Mariea Clonts, D.O., C.M.D.  Code Status: DNR Goals of Care:  Advanced Directives 07/18/2018  Does Patient Have a Medical Advance Directive? Yes  Type of Advance Directive Out of facility DNR (pink MOST or yellow form);Healthcare Power of Attorney  Does patient want to make changes to medical advance directive? No - Patient declined  Copy of [place] in Chart? Yes - validated most recent copy scanned in chart (See row information)  Pre-existing out of facility DNR order (yellow form or pink MOST form) Yellow form placed in chart (order not valid for inpatient use)     Chief Complaint  Patient presents with  . Medical Management of Chronic Issues    45mth follow-up    HPI: Patient is a 85 y.o. male seen today for medical management of chronic diseases.    He reports doing well.  He denies chest pain.  He gets dyspneic with much exertion, but does not do much of that. He uses his scooter to go long distances in [place] and was going to the main dining room prior to covid-19 isolation.  He uses his rolling walker with skis short distances.    His edema of his legs remains, but he's refused any increases in his diuretic therapy.  POX 96 on RA.  He is not seeing too well anymore, but typically goes out to ophtho so not an option at present.    His knee continues to bother him at night.  He now agrees to take his tylenol which he keeps in his apt once nightly to see if this will keep him from waking up and having to get up and go sleep in his recliner for a couple of hours.  He had previously refused to take it consistently b/c he does not hurt when he goes to bed, but he's now willing to try for preventive purposes. That was his biggest complaint today.  He still has skin tags on his buttocks that are painful if he positions himself a certain way.   Past Medical History:   Diagnosis Date  . Abnormality of gait 02/2010  . Anal and rectal polyp 1980  . Atrial fibrillation ([place]) 11/2009  . Chronic kidney disease, stage III (moderate) ([place]) 08/07/2013  . Deviated nasal septum 11/2009  . Diaphragmatic hernia without mention of obstruction or gangrene 2007  . Diarrhea 02/2010  . Dizziness and giddiness 2009  . Edema 2010  . Essential and other specified forms of tremor 2007  . 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 Hyperglycemia 01/02/2013  . Hypertrophy of prostate without urinary obstruction and other lower urinary tract symptoms (LUTS) 2002  . Insomnia with sleep apnea, unspecified 02/2010  . Irritable bowel syndrome 11/14/2011  . Long term (current) use of anticoagulants 11/2009  . Other abnormal blood chemistry 02/09/2009  . Other B-complex deficiencies 2008  . Other dyspnea and respiratory abnormality 11/2009  . Other malaise and fatigue 2007  . Pain in joint, lower leg 02/2010  . Palpitations 2007  . Reflux esophagitis 2007  . Sebaceous cyst 2008  . Tension headache 2009  . Undiagnosed cardiac murmurs 2002  . Unspecified constipation 07/04/2012  . Unspecified essential hypertension 2007  . Unspecified hereditary and idiopathic peripheral neuropathy 02/2010  . Unspecified transient cerebral ischemia 2002  . Urinary frequency 10/01/2012    No past surgical history on file.  Allergies  Allergen Reactions  . Plavix [Clopidogrel Bisulfate]     indigestion    Outpatient Encounter Medications as of 11/21/2018  Medication Sig  . Acetaminophen 500 MG coapsule Take 2 capsules by mouth as needed.   Marland Kitchen apixaban (ELIQUIS) 2.5 MG TABS tablet Take 1 tablet (2.5 mg total) by mouth 2 (two) times daily.  Marland Kitchen doxazosin (CARDURA) 2 MG tablet Take 1 tablet (2 mg total) by mouth every morning.  . Eyelid Cleansers (OCUSOFT EYELID CLEANSING) PADS Apply topically as needed.  . furosemide (LASIX) 40 MG tablet Take 40 mg by mouth daily. NO LASIX ON Saturday  OR Sunday  . Lactase (LACTAID FAST ACT) 9000 units CHEW chew 1 tab (9,000 u) by mouth w/ first bite of milk product PRN lactose intolerant.  . Levothyroxine Sodium 50 MCG CAPS Take 1 capsule (50 mcg total) by mouth daily before breakfast.  . metoprolol succinate (TOPROL-XL) 25 MG 24 hr tablet Take 12.5 mg by mouth as needed (For palpitations).   . mineral oil-hydrophilic petrolatum (AQUAPHOR) ointment Apply 1 application topically 2 (two) times daily as needed for dry skin.  . mupirocin ointment (BACTROBAN) 2 % Apply 1 application topically 2 (two) times daily. Apply to left buttock skin tag PRN no more than 2 times a day until healed  . omeprazole (PRILOSEC OTC) 20 MG tablet Take 20 mg by mouth 2 (two) times daily. Before breakfast/dinner  . ondansetron (ZOFRAN) 4 MG tablet Take 4 mg by mouth every 6 (six) hours as needed for nausea or vomiting.  . Polyvinyl Alcohol-Povidone PF 1.4-0.6 % SOLN Apply 1.4 % to eye 2 (two) times daily.   . potassium chloride 20 MEQ/15ML (10%) SOLN Take 15 mLs by mouth daily. On Mon thru Fri, none on sat or sun  . trolamine salicylate (ASPERCREME) 10 % cream Apply 1 application topically 2 (two) times daily as needed.  . vitamin B-12 (CYANOCOBALAMIN) 1000 MCG tablet Take 1,000 mcg by mouth daily.  . [DISCONTINUED] antiseptic oral rinse (BIOTENE) LIQD 15 mLs 4 (four) times daily by Mouth Rinse route.  . [DISCONTINUED] sodium chloride (OCEAN) 0.65 % nasal spray Place 1 spray into the nose as needed for congestion (1 spray each nostril up to 3 days for nasal congestion.).   No facility-administered encounter medications on file as of 11/21/2018.     Review of Systems:  Review of Systems  Constitutional: Positive for malaise/fatigue. Negative for chills and fever.  HENT: Positive for hearing loss. Negative for congestion.   Eyes: Positive for blurred vision.  Respiratory: Positive for shortness of breath. Negative for cough, sputum production and wheezing.    Cardiovascular: Positive for orthopnea and leg swelling. Negative for chest pain, palpitations and PND.  Gastrointestinal: Positive for constipation. Negative for abdominal pain, blood in stool, diarrhea and melena.       Still bothered some by fecal incontinence which takes a long time to get cleaned up from and embarrasses him   Genitourinary: Negative for dysuria.  Musculoskeletal: Positive for joint pain. Negative for back pain and falls.  Skin: Negative for itching (skin tags on buttocks painful sometimes but did not want removed) and rash.  Neurological: Negative for dizziness and loss of consciousness.  Endo/Heme/Allergies: Bruises/bleeds easily.  Psychiatric/Behavioral: Positive for memory loss. Negative for depression. The patient is not nervous/anxious and does not have insomnia.        Mild cognitive impairment    Health Maintenance  Topic Date Due  . TETANUS/TDAP  02/24/2027  . INFLUENZA VACCINE  Completed  . PNA vac Low Risk Adult  Completed    Physical Exam: Vitals:   11/21/18 1404  BP: 128/60  Pulse: 68  Temp: 98.4 F (36.9 C)  TempSrc: Oral  SpO2: 96%  Weight: 195 lb (88.5 kg)  Height: 6' (1.829 m)   Body mass index is 26.45 kg/m. Physical Exam Vitals signs reviewed.  Constitutional:      General: He is not in acute distress.    Appearance: Normal appearance. He is not toxic-appearing.  HENT:     Head: Normocephalic and atraumatic.     Ears:     Comments: HOH, hearing aids Eyes:     Conjunctiva/sclera: Conjunctivae normal.     Pupils: Pupils are equal, round, and reactive to light.     Comments: Glasses  Cardiovascular:     Rate and Rhythm: Rhythm irregular.     Heart sounds: Murmur present.  Pulmonary:     Effort: Pulmonary effort is normal.     Breath sounds: Normal breath sounds. No rales.  Abdominal:     General: Bowel sounds are normal. There is no distension.     Palpations: Abdomen is soft. There is no mass.     Tenderness: There is no  abdominal tenderness.  Musculoskeletal: Normal range of motion.     Right lower leg: Edema present.     Left lower leg: Edema present.     Comments: Left foot and ankle chronically more swollen than right; tenderness of right knee  Skin:    General: Skin is warm and dry.  Neurological:     General: No focal deficit present.     Mental Status: He is alert and oriented to person, place, and time.  Psychiatric:        Mood and Affect: Mood normal.     Labs reviewed: Basic Metabolic Panel: Recent Labs    12/25/17 0800 05/15/18 0700 05/23/18 07/20/18 0600  NA 144 141  --   --   K 4.5 4.1  --   --   BUN 31* 35*  --   --   CREATININE 1.4* 1.9*  --   --   TSH  --   --  4.63 3.11   Liver Function Tests: No results for input(s): AST, ALT, ALKPHOS, BILITOT, PROT, ALBUMIN in the last 8760 hours. No results for input(s): LIPASE, AMYLASE in the last 8760 hours. No results for input(s): AMMONIA in the last 8760 hours. CBC: Recent Labs    12/25/17 0800 05/15/18 0700  WBC 4.4 4.5  HGB 12.6* 12.7*  HCT 39* 39*  PLT 139* 139*   Lipid Panel: No results for input(s): CHOL, HDL, LDLCALC, TRIG, CHOLHDL, LDLDIRECT in the last 8760 hours. Lab Results  Component Value Date   HGBA1C 5.3 03/01/2016    Assessment/Plan 1. Chronic congestive heart failure, unspecified heart failure type ([place]) -seems to be stable, not fully controlled as he will not take more diuretics due to incontinence concerns (keeps some dyspnea and edema, but this does not appear worse and weight is stable at 195 lb  2. Longstanding persistent atrial fibrillation -ongoing, cont eliquis for anticoagulation and toprol for rate control  3. Hypothyroidism, unspecified type -cont current levothyroxine Lab Results  Component Value Date   TSH 3.11 07/20/2018    4. Primary osteoarthritis of right knee Knee pain--two 500mg  tylenol at bedtime--he will take them on his own at his request--to do every night to prevent the  pain that wakes him and requires him  to get up walk around and move to his recliner   5. Slow transit constipation -seems a little better, but still struggles with sudden incontinence at times w/o bowel regimen and with lactaid chews  6. Chronic kidney disease, stage III (moderate) (HCC) -has been stable last check, avoiding frequent testing due to his advanced age and comfort goals  Labs/tests ordered:  No new Next appt:  04/03/2019  Makena Mcgrady L. Dylin Ihnen, D.O. [facility] [address], [place] [address] Cell Phone (Mon-Fri 8am-5pm):  [phone] On Call:  [phone] & follow prompts after 5pm & weekends Office Phone:  [phone] Office Fax:  [phone]

## 2019-02-13 DIAGNOSIS — Z20828 Contact with and (suspected) exposure to other viral communicable diseases: Secondary | ICD-10-CM | POA: Diagnosis not present

## 2019-04-03 ENCOUNTER — Other Ambulatory Visit: Payer: Self-pay

## 2019-04-03 ENCOUNTER — Non-Acute Institutional Stay: Payer: Medicare Other | Admitting: Internal Medicine

## 2019-04-03 ENCOUNTER — Encounter: Payer: Self-pay | Admitting: Internal Medicine

## 2019-04-03 VITALS — BP 128/70 | HR 57 | Temp 98.3°F | Ht 72.0 in | Wt 196.0 lb

## 2019-04-03 DIAGNOSIS — E039 Hypothyroidism, unspecified: Secondary | ICD-10-CM | POA: Diagnosis not present

## 2019-04-03 DIAGNOSIS — K5901 Slow transit constipation: Secondary | ICD-10-CM

## 2019-04-03 DIAGNOSIS — I4811 Longstanding persistent atrial fibrillation: Secondary | ICD-10-CM | POA: Diagnosis not present

## 2019-04-03 DIAGNOSIS — N183 Chronic kidney disease, stage 3 unspecified: Secondary | ICD-10-CM

## 2019-04-03 DIAGNOSIS — M1711 Unilateral primary osteoarthritis, right knee: Secondary | ICD-10-CM | POA: Diagnosis not present

## 2019-04-03 DIAGNOSIS — I509 Heart failure, unspecified: Secondary | ICD-10-CM | POA: Diagnosis not present

## 2019-04-03 NOTE — Progress Notes (Signed)
Location:  Occupational psychologist of Service:  Clinic (12)  Provider: Eric Morganti L. Mariea Clonts, D.O., C.M.D.  Code Status: DNR Goals of Care:  Advanced Directives 04/05/2019  Does Patient Have a Medical Advance Directive? Yes  Type of Paramedic of [place];Living will;Out of facility DNR (pink MOST or yellow form)  Does patient want to make changes to medical advance directive? No - Patient declined  Copy of [place] in Chart? Yes - validated most recent copy scanned in chart (See row information)  Pre-existing out of facility DNR order (yellow form or pink MOST form) -     Chief Complaint  Patient presents with  . Medical Management of Chronic Issues    35mth follow-up    HPI: Patient is a 85 y.o. male seen today for medical management of chronic diseases.    Gary [name] is doing ok.  He's now 85 years old.  He reports his symptoms overall have not changed much in the past 4 months.  He does sleep more.  He basically wakes up for meals and a short time after them, and then sleeps more.    He does still get up at night and move to a chair due to his knee pain and heel pain--taking tylenol regularly at night didn't really help this by his report--it's better once he walks on it and gets positioned in his recliner--he's then able to sleep a bit more.    He's not had any issues with his bowel incontinence by his report. He is not concerned about his bowels at all today.     He's had no more dizzy spells lately.    His weight is stable at 196 lbs though he reports eating less at meals as his appetite is no longer as impressive.    He is content--gets to do facetime visits with his family.  Does, of course, wish he could see them in person.    Past Medical History:  Diagnosis Date  . Abnormality of gait 02/2010  . Anal and rectal polyp 1980  . Atrial fibrillation ([place]) 11/2009  . Chronic kidney disease, stage III (moderate) ([place])  08/07/2013  . Deviated nasal septum 11/2009  . Diaphragmatic hernia without mention of obstruction or gangrene 2007  . Diarrhea 02/2010  . Dizziness and giddiness 2009  . Edema 2010  . Essential and other specified forms of tremor 2007  . 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 Hyperglycemia 01/02/2013  . Hypertrophy of prostate without urinary obstruction and other lower urinary tract symptoms (LUTS) 2002  . Insomnia with sleep apnea, unspecified 02/2010  . Irritable bowel syndrome 11/14/2011  . Long term (current) use of anticoagulants 11/2009  . Other abnormal blood chemistry 02/09/2009  . Other B-complex deficiencies 2008  . Other dyspnea and respiratory abnormality 11/2009  . Other malaise and fatigue 2007  . Pain in joint, lower leg 02/2010  . Palpitations 2007  . Reflux esophagitis 2007  . Sebaceous cyst 2008  . Tension headache 2009  . Undiagnosed cardiac murmurs 2002  . Unspecified constipation 07/04/2012  . Unspecified essential hypertension 2007  . Unspecified hereditary and idiopathic peripheral neuropathy 02/2010  . Unspecified transient cerebral ischemia 2002  . Urinary frequency 10/01/2012    No past surgical history on file.  Allergies  Allergen Reactions  . Plavix [Clopidogrel Bisulfate]     indigestion    Outpatient Encounter Medications as of 04/03/2019  Medication Sig  .  Acetaminophen 500 MG coapsule Take 2 capsules by mouth at bedtime as needed.  Marland Kitchen apixaban (ELIQUIS) 2.5 MG TABS tablet Take 1 tablet (2.5 mg total) by mouth 2 (two) times daily.  Marland Kitchen doxazosin (CARDURA) 2 MG tablet Take 1 tablet (2 mg total) by mouth every morning.  . Eyelid Cleansers (OCUSOFT EYELID CLEANSING) PADS Apply topically as needed.  . furosemide (LASIX) 40 MG tablet Take 40 mg by mouth daily. NO LASIX ON Saturday OR Sunday  . Lactase (LACTAID FAST ACT) 9000 units CHEW chew 1 tab (9,000 u) by mouth w/ first bite of milk product PRN lactose intolerant.  . Levothyroxine  Sodium 50 MCG CAPS Take 1 capsule (50 mcg total) by mouth daily before breakfast.  . metoprolol succinate (TOPROL-XL) 25 MG 24 hr tablet Take 12.5 mg by mouth as needed (For palpitations).   . mineral oil-hydrophilic petrolatum (AQUAPHOR) ointment Apply 1 application topically 2 (two) times daily as needed for dry skin.  . mupirocin ointment (BACTROBAN) 2 % Apply 1 application topically 2 (two) times daily. Apply to left buttock skin tag PRN no more than 2 times a day until healed  . omeprazole (PRILOSEC OTC) 20 MG tablet Take 20 mg by mouth 2 (two) times daily. Before breakfast/dinner  . ondansetron (ZOFRAN) 4 MG tablet Take 4 mg by mouth every 6 (six) hours as needed for nausea or vomiting.  . Polyvinyl Alcohol-Povidone PF 1.4-0.6 % SOLN Apply 1.4 % to eye 2 (two) times daily.   . potassium chloride 20 MEQ/15ML (10%) SOLN Take 15 mLs by mouth daily. On Mon thru Fri, none on sat or sun  . trolamine salicylate (ASPERCREME) 10 % cream Apply 1 application topically 2 (two) times daily as needed.  . vitamin B-12 (CYANOCOBALAMIN) 1000 MCG tablet Take 1,000 mcg by mouth daily.   No facility-administered encounter medications on file as of 04/03/2019.     Review of Systems:  Review of Systems  Constitutional: Positive for malaise/fatigue. Negative for chills, fever and weight loss.  HENT: Positive for hearing loss. Negative for congestion and sore throat.   Eyes:       Wears glasses  Respiratory: Negative for cough, sputum production and shortness of breath.   Cardiovascular: Positive for leg swelling. Negative for chest pain and palpitations.       Edema at baseline and he refuses to take anymore lasix due to urinary incontinence and frequency  Gastrointestinal: Positive for heartburn. Negative for abdominal pain, blood in stool, constipation, diarrhea and melena.  Genitourinary: Positive for frequency. Negative for dysuria.  Musculoskeletal: Positive for falls and joint pain.       Reports a  fall onto his bed one day when trying to put his pants on by himself rather than asking for help  Skin: Negative for rash.  Neurological: Negative for dizziness and loss of consciousness.  Endo/Heme/Allergies: Bruises/bleeds easily.  Psychiatric/Behavioral: Positive for memory loss. Negative for depression. The patient is not nervous/anxious and does not have insomnia.        Very mild memory loss    Health Maintenance  Topic Date Due  . INFLUENZA VACCINE  04/06/2019  . TETANUS/TDAP  02/24/2027  . PNA vac Low Risk Adult  Completed    Physical Exam: Vitals:   04/03/19 1357  BP: 128/70  Pulse: (!) 57  Temp: 98.3 F (36.8 C)  TempSrc: Oral  SpO2: 97%  Weight: 196 lb (88.9 kg)  Height: 6' (1.829 m)   Body mass index is 26.58 kg/m. Physical  Exam Vitals signs reviewed.  Constitutional:      General: He is not in acute distress.    Appearance: Normal appearance. He is not ill-appearing or toxic-appearing.  HENT:     Head: Normocephalic and atraumatic.  Eyes:     Comments: glasses  Cardiovascular:     Comments: Irregular irregular Pulmonary:     Effort: Pulmonary effort is normal.     Breath sounds: Normal breath sounds. No wheezing, rhonchi or rales.  Abdominal:     General: Bowel sounds are normal. There is distension.     Palpations: There is no mass.     Tenderness: There is no abdominal tenderness.  Musculoskeletal: Normal range of motion.     Right lower leg: Edema present.     Left lower leg: Edema present.     Comments: Chronic 2+ edema  Skin:    General: Skin is warm and dry.     Capillary Refill: Capillary refill takes less than 2 seconds.  Neurological:     General: No focal deficit present.     Mental Status: He is alert and oriented to person, place, and time.     Labs reviewed: Basic Metabolic Panel: Recent Labs    05/15/18 0700 05/23/18 07/20/18 0600  NA 141  --   --   K 4.1  --   --   BUN 35*  --   --   CREATININE 1.9*  --   --   TSH  --   4.63 3.11   Liver Function Tests: No results for input(s): AST, ALT, ALKPHOS, BILITOT, PROT, ALBUMIN in the last 8760 hours. No results for input(s): LIPASE, AMYLASE in the last 8760 hours. No results for input(s): AMMONIA in the last 8760 hours. CBC: Recent Labs    05/15/18 0700  WBC 4.5  HGB 12.7*  HCT 39*  PLT 139*   Lipid Panel: No results for input(s): CHOL, HDL, LDLCALC, TRIG, CHOLHDL, LDLDIRECT in the last 8760 hours. Lab Results  Component Value Date   HGBA1C 5.3 03/01/2016    Assessment/Plan 1. Chronic congestive heart failure, unspecified heart failure type ([place]) -continues to run chronically wet but will not agree to more lasix due to urinary frequency and incontinence  2. Longstanding persistent atrial fibrillation -rate controlled, continues on eliquis for anticoagulation  3. Hypothyroidism, unspecified type -thyroid at goal Lab Results  Component Value Date   TSH 3.11 07/20/2018    4. Chronic kidney disease, stage III (moderate) ([facility]) -Avoid nephrotoxic agents like nsaids, dose adjust renally excreted meds, hydrate.  5. Primary osteoarthritis of right knee -ongoing, continue tylenol  6. Slow transit constipation -cont same regimen which has been effective  Labs/tests ordered:  No new Next appt:  4 mos  Gary Purdon L. Gary [name], D.O. [facility] [address], [place] [address] Cell Phone (Mon-Fri 8am-5pm):  [phone] On Call:  [phone] & follow prompts after 5pm & weekends Office Phone:  [phone] Office Fax:  [phone]

## 2019-04-05 ENCOUNTER — Encounter: Payer: Self-pay | Admitting: Adult Health

## 2019-04-05 ENCOUNTER — Non-Acute Institutional Stay: Payer: Medicare Other | Admitting: Adult Health

## 2019-04-05 DIAGNOSIS — Z0000 Encounter for general adult medical examination without abnormal findings: Secondary | ICD-10-CM | POA: Diagnosis not present

## 2019-04-05 NOTE — Patient Instructions (Signed)
Gary [name] , Thank you for taking time to come for your Medicare Wellness Visit. I appreciate your ongoing commitment to your health goals. Please review the following plan we discussed and let me know if I can assist you in the future.   Screening recommendations/referrals: Colonoscopy aged out Recommended yearly ophthalmology/optometry visit for glaucoma screening and checkup Recommended yearly dental visit for hygiene and checkup  Vaccinations: Influenza vaccine up to date Pneumococcal vaccine up to date Tdap vaccine up to date Shingles vaccine up to date    Advanced directives:  reviewed  Conditions/risks identified: cardiac risk  Next appointment: 1 year   Preventive Care 16 Years and Older, Male Preventive care refers to lifestyle choices and visits with your health care provider that can promote health and wellness. What does preventive care include?  A yearly physical exam. This is also called an annual well check.  Dental exams once or twice a year.  Routine eye exams. Ask your health care provider how often you should have your eyes checked.  Personal lifestyle choices, including:  Daily care of your teeth and gums.  Regular physical activity.  Eating a healthy diet.  Avoiding tobacco and drug use.  Limiting alcohol use.  Practicing safe sex.  Taking low doses of aspirin every day.  Taking vitamin and mineral supplements as recommended by your health care provider. What happens during an annual well check? The services and screenings done by your health care provider during your annual well check will depend on your age, overall health, lifestyle risk factors, and family history of disease. Counseling  Your health care provider may ask you questions about your:  Alcohol use.  Tobacco use.  Drug use.  Emotional well-being.  Home and relationship well-being.  Sexual activity.  Eating habits.  History of falls.  Memory and ability to  understand (cognition).  Work and work Statistician. Screening  You may have the following tests or measurements:  Height, weight, and BMI.  Blood pressure.  Lipid and cholesterol levels. These may be checked every 5 years, or more frequently if you are over 29 years old.  Skin check.  Lung cancer screening. You may have this screening every year starting at age 28 if you have a 30-pack-year history of smoking and currently smoke or have quit within the past 15 years.  Fecal occult blood test (FOBT) of the stool. You may have this test every year starting at age 69.  Flexible sigmoidoscopy or colonoscopy. You may have a sigmoidoscopy every 5 years or a colonoscopy every 10 years starting at age 71.  Prostate cancer screening. Recommendations will vary depending on your family history and other risks.  Hepatitis C blood test.  Hepatitis B blood test.  Sexually transmitted disease (STD) testing.  Diabetes screening. This is done by checking your blood sugar (glucose) after you have not eaten for a while (fasting). You may have this done every 1-3 years.  Abdominal aortic aneurysm (AAA) screening. You may need this if you are a current or former smoker.  Osteoporosis. You may be screened starting at age 63 if you are at high risk. Talk with your health care provider about your test results, treatment options, and if necessary, the need for more tests. Vaccines  Your health care provider may recommend certain vaccines, such as:  Influenza vaccine. This is recommended every year.  Tetanus, diphtheria, and acellular pertussis (Tdap, Td) vaccine. You may need a Td booster every 10 years.  Zoster vaccine. You may  need this after age 18.  Pneumococcal 13-valent conjugate (PCV13) vaccine. One dose is recommended after age 60.  Pneumococcal polysaccharide (PPSV23) vaccine. One dose is recommended after age 48. Talk to your health care provider about which screenings and vaccines you  need and how often you need them. This information is not intended to replace advice given to you by your health care provider. Make sure you discuss any questions you have with your health care provider. Document Released: 09/18/2015 Document Revised: 05/11/2016 Document Reviewed: 06/23/2015 Elsevier Interactive Patient Education  2017 [employer] Prevention in the Home Falls can cause injuries. They can happen to people of all ages. There are many things you can do to make your home safe and to help prevent falls. What can I do on the outside of my home?  Regularly fix the edges of walkways and driveways and fix any cracks.  Remove anything that might make you trip as you walk through a door, such as a raised step or threshold.  Trim any bushes or trees on the path to your home.  Use bright outdoor lighting.  Clear any walking paths of anything that might make someone trip, such as rocks or tools.  Regularly check to see if handrails are loose or broken. Make sure that both sides of any steps have handrails.  Any raised decks and porches should have guardrails on the edges.  Have any leaves, snow, or ice cleared regularly.  Use sand or salt on walking paths during winter.  Clean up any spills in your garage right away. This includes oil or grease spills. What can I do in the bathroom?  Use night lights.  Install grab bars by the toilet and in the tub and shower. Do not use towel bars as grab bars.  Use non-skid mats or decals in the tub or shower.  If you need to sit down in the shower, use a plastic, non-slip stool.  Keep the floor dry. Clean up any water that spills on the floor as soon as it happens.  Remove soap buildup in the tub or shower regularly.  Attach bath mats securely with double-sided non-slip rug tape.  Do not have throw rugs and other things on the floor that can make you trip. What can I do in the bedroom?  Use night lights.  Make sure  that you have a light by your bed that is easy to reach.  Do not use any sheets or blankets that are too big for your bed. They should not hang down onto the floor.  Have a firm chair that has side arms. You can use this for support while you get dressed.  Do not have throw rugs and other things on the floor that can make you trip. What can I do in the kitchen?  Clean up any spills right away.  Avoid walking on wet floors.  Keep items that you use a lot in easy-to-reach places.  If you need to reach something above you, use a strong step stool that has a grab bar.  Keep electrical cords out of the way.  Do not use floor polish or wax that makes floors slippery. If you must use wax, use non-skid floor wax.  Do not have throw rugs and other things on the floor that can make you trip. What can I do with my stairs?  Do not leave any items on the stairs.  Make sure that there are handrails on both sides  of the stairs and use them. Fix handrails that are broken or loose. Make sure that handrails are as long as the stairways.  Check any carpeting to make sure that it is firmly attached to the stairs. Fix any carpet that is loose or worn.  Avoid having throw rugs at the top or bottom of the stairs. If you do have throw rugs, attach them to the floor with carpet tape.  Make sure that you have a light switch at the top of the stairs and the bottom of the stairs. If you do not have them, ask someone to add them for you. What else can I do to help prevent falls?  Wear shoes that:  Do not have high heels.  Have rubber bottoms.  Are comfortable and fit you well.  Are closed at the toe. Do not wear sandals.  If you use a stepladder:  Make sure that it is fully opened. Do not climb a closed stepladder.  Make sure that both sides of the stepladder are locked into place.  Ask someone to hold it for you, if possible.  Clearly mark and make sure that you can see:  Any grab bars or  handrails.  First and last steps.  Where the edge of each step is.  Use tools that help you move around (mobility aids) if they are needed. These include:  Canes.  Walkers.  Scooters.  Crutches.  Turn on the lights when you go into a dark area. Replace any light bulbs as soon as they burn out.  Set up your furniture so you have a clear path. Avoid moving your furniture around.  If any of your floors are uneven, fix them.  If there are any pets around you, be aware of where they are.  Review your medicines with your doctor. Some medicines can make you feel dizzy. This can increase your chance of falling. Ask your doctor what other things that you can do to help prevent falls. This information is not intended to replace advice given to you by your health care provider. Make sure you discuss any questions you have with your health care provider. Document Released: 06/18/2009 Document Revised: 01/28/2016 Document Reviewed: 09/26/2014 Elsevier Interactive Patient Education  2017 [employer].

## 2019-04-05 NOTE — Progress Notes (Signed)
Subjective:   Gary [name] is a 85 y.o. male who presents for Medicare Annual/Subsequent preventive examination.  Review of Systems:   Cardiac Risk Factors include: male gender;advanced age (>21men, >90 women);hypertension     Objective:    Vitals: Wt 195 lb 9.6 oz (88.7 kg)   BMI 26.53 kg/m   Body mass index is 26.53 kg/m.  Advanced Directives 04/05/2019 07/18/2018 05/16/2018 02/27/2018 12/27/2017 08/16/2017 06/27/2017  Does Patient Have a Medical Advance Directive? Yes Yes Yes Yes Yes Yes Yes  Type of Paramedic of [place];Living will;Out of facility DNR (pink MOST or yellow form) Out of facility DNR (pink MOST or yellow form);Healthcare Power of [place];Living will [place];Out of facility DNR (pink MOST or yellow form) [place];Out of facility DNR (pink MOST or yellow form) Out of facility DNR (pink MOST or yellow form);[place];Living will Out of facility DNR (pink MOST or yellow form);[place];Living will  Does patient want to make changes to medical advance directive? No - Patient declined No - Patient declined No - Patient declined No - Patient declined No - Patient declined No - Patient declined -  Copy of [place] in Chart? Yes - validated most recent copy scanned in chart (See row information) Yes - validated most recent copy scanned in chart (See row information) Yes Yes Yes Yes Yes  Pre-existing out of facility DNR order (yellow form or pink MOST form) - Yellow form placed in chart (order not valid for inpatient use) - Yellow form placed in chart (order not valid for inpatient use) Yellow form placed in chart (order not valid for inpatient use) Yellow form placed in chart (order not valid for inpatient use) -    Tobacco Social History   Tobacco Use  Smoking Status Never Smoker  Smokeless Tobacco Never Used      Counseling given: Not Answered   Clinical Intake:     Pain : 0-10 Pain Score: 4  Pain Type: Chronic pain Pain Location: Buttocks Pain Onset: More than a month ago Pain Frequency: Intermittent Pain Relieving Factors: CUSHION Effect of Pain on Daily Activities: NO  Pain Relieving Factors: CUSHION     How often do you need to have someone help you when you read instructions, pamphlets, or other written materials from your doctor or pharmacy?: 3 - Sometimes What is the last grade level you completed in school?: POST GRADUATE  Interpreter Needed?: No  Information entered by :: Royal Hawthorn NP  Past Medical History:  Diagnosis Date  . Abnormality of gait 02/2010  . Anal and rectal polyp 1980  . Atrial fibrillation ([place]) 11/2009  . Chronic kidney disease, stage III (moderate) ([place]) 08/07/2013  . Deviated nasal septum 11/2009  . Diaphragmatic hernia without mention of obstruction or gangrene 2007  . Diarrhea 02/2010  . Dizziness and giddiness 2009  . Edema 2010  . Essential and other specified forms of tremor 2007  . 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 Hyperglycemia 01/02/2013  . Hypertrophy of prostate without urinary obstruction and other lower urinary tract symptoms (LUTS) 2002  . Insomnia with sleep apnea, unspecified 02/2010  . Irritable bowel syndrome 11/14/2011  . Long term (current) use of anticoagulants 11/2009  . Other abnormal blood chemistry 02/09/2009  . Other B-complex deficiencies 2008  . Other dyspnea and respiratory abnormality 11/2009  . Other malaise and fatigue 2007  . Pain  in joint, lower leg 02/2010  . Palpitations 2007  . Reflux esophagitis 2007  . Sebaceous cyst 2008  . Tension headache 2009  . Undiagnosed cardiac murmurs 2002  . Unspecified constipation 07/04/2012  . Unspecified essential hypertension 2007  . Unspecified hereditary and idiopathic peripheral neuropathy 02/2010  . Unspecified transient cerebral ischemia 2002  .  Urinary frequency 10/01/2012   No past surgical history on file. Family History  Problem Relation Age of Onset  . Congestive Heart Failure Mother   . Diabetes Father   . Heart disease Father   . Lung cancer Sister   . Kidney disease Sister    Social History   Socioeconomic History  . Marital status: Widowed    Spouse name: Not on file  . Number of children: Not on file  . Years of education: Not on file  . Highest education level: Not on file  Occupational History  . Occupation: retired   Scientific laboratory technician  . Financial resource strain: Not hard at all  . Food insecurity    Worry: Never true    Inability: Never true  . Transportation needs    Medical: No    Non-medical: No  Tobacco Use  . Smoking status: Never Smoker  . Smokeless tobacco: Never Used  Substance and Sexual Activity  . Alcohol use: Yes    Comment: 2oz a week  . Drug use: No  . Sexual activity: Never  Lifestyle  . Physical activity    Days per week: 0 days    Minutes per session: 0 min  . Stress: Not at all  Relationships  . Social connections    Talks on phone: More than three times a week    Gets together: More than three times a week    Attends religious service: Never    Active member of club or organization: No    Attends meetings of clubs or organizations: Never    Relationship status: Widowed  Other Topics Concern  . Not on file  Social History Narrative   Lives at [facility] since 02/1992   Widowed   Living Will, DNR   Retired Futures trader with walking, power wheelchair   Exercise: none    Outpatient Encounter Medications as of 04/05/2019  Medication Sig  . Acetaminophen 500 MG coapsule Take 2 capsules by mouth at bedtime as needed.  Marland Kitchen apixaban (ELIQUIS) 2.5 MG TABS tablet Take 1 tablet (2.5 mg total) by mouth 2 (two) times daily.  Marland Kitchen doxazosin (CARDURA) 2 MG tablet Take 1 tablet (2 mg total) by mouth every morning.  . Eyelid Cleansers (OCUSOFT EYELID CLEANSING) PADS Apply topically as  needed.  . furosemide (LASIX) 40 MG tablet Take 40 mg by mouth daily. NO LASIX ON Saturday OR Sunday  . Lactase (LACTAID FAST ACT) 9000 units CHEW chew 1 tab (9,000 u) by mouth w/ first bite of milk product PRN lactose intolerant.  . Levothyroxine Sodium 50 MCG CAPS Take 1 capsule (50 mcg total) by mouth daily before breakfast.  . metoprolol succinate (TOPROL-XL) 25 MG 24 hr tablet Take 12.5 mg by mouth as needed (For palpitations).   . mineral oil-hydrophilic petrolatum (AQUAPHOR) ointment Apply 1 application topically 2 (two) times daily as needed for dry skin.  . mupirocin ointment (BACTROBAN) 2 % Apply 1 application topically 2 (two) times daily. Apply to left buttock skin tag PRN no more than 2 times a day until healed  . omeprazole (PRILOSEC OTC) 20 MG tablet Take  20 mg by mouth 2 (two) times daily. Before breakfast/dinner  . ondansetron (ZOFRAN) 4 MG tablet Take 4 mg by mouth every 6 (six) hours as needed for nausea or vomiting.  . Polyvinyl Alcohol-Povidone PF 1.4-0.6 % SOLN Apply 1.4 % to eye 2 (two) times daily.   . potassium chloride 20 MEQ/15ML (10%) SOLN Take 15 mLs by mouth daily. On Mon thru Fri, none on sat or sun  . trolamine salicylate (ASPERCREME) 10 % cream Apply 1 application topically 2 (two) times daily as needed.  . vitamin B-12 (CYANOCOBALAMIN) 1000 MCG tablet Take 1,000 mcg by mouth daily.   No facility-administered encounter medications on file as of 04/05/2019.     Activities of Daily Living In your present state of health, do you have any difficulty performing the following activities: 04/05/2019  Hearing? Y  Vision? Y  Difficulty concentrating or making decisions? Y  Walking or climbing stairs? Y  Dressing or bathing? Y  Preparing Food and eating ? Y  Using the Toilet? N  In the past six months, have you accidently leaked urine? Y  Do you have problems with loss of bowel control? Y  Managing your Medications? Y  Managing your Finances? Y  Housekeeping or  managing your Housekeeping? Y  Some recent data might be hidden    Patient Care Team: Gayland Curry, DO as PCP - General (Geriatric Medicine) [facility], Nicki Reaper, DDS as Consulting Physician (Oral Surgery)   Assessment:   This is a routine wellness examination for Gary [name].  Exercise Activities and Dietary recommendations Current Exercise Habits: The patient does not participate in regular exercise at present, Exercise limited by: orthopedic condition(s)  Goals    . Maintain Lifestyle     Pt will maintain lifestyle.        Fall Risk Fall Risk  04/05/2019 04/03/2019 11/21/2018 07/18/2018 05/16/2018  Falls in the past year? 0 0 0 1 No  Comment - - - fall in transfer from walker to chair -  Number falls in past yr: 0 0 0 0 -  Injury with Fall? 0 0 0 0 -  Risk for fall due to : Impaired balance/gait - - - -  Follow up Falls evaluation completed;Education provided - - - -   Is the patient's home free of loose throw rugs in walkways, pet beds, electrical cords, etc?   yes      Grab bars in the bathroom? yes      Handrails on the stairs?   yes      Adequate lighting?   yes  Timed Get Up and Go Performed: not performed  Depression Screen PHQ 2/9 Scores 04/05/2019 04/03/2019 11/21/2018 05/16/2018  PHQ - 2 Score 0 0 0 0    Cognitive Function MMSE - Mini Mental State Exam 04/05/2019 01/04/2018 11/24/2016 05/21/2014  Orientation to time 5 5 5 5   Orientation to Place 5 5 5 5   Registration 3 3 3 3   Attention/ Calculation 5 4 5 5   Recall 3 3 3 3   Language- name 2 objects 2 2 2 2   Language- repeat 1 1 1 1   Language- follow 3 step command 3 3 3 3   Language- read & follow direction 1 1 1 1   Write a sentence 1 1 1 1   Copy design 1 1 1 1   Total score 30 29 30 30         Immunization History  Administered Date(s) Administered  . Influenza Inj Mdck Quad Pf  06/23/2016  . Influenza,inj,Quad PF,6+ Mos 07/05/2018  . Influenza-Unspecified 06/11/2013, 06/23/2014,  06/25/2015, 06/29/2017  . Pneumococcal Conjugate-13 09/09/2015  . Pneumococcal Polysaccharide-23 09/05/2008  . Td 09/05/1978  . Tdap 02/23/2017  . Zoster Recombinat (Shingrix) 10/02/2017, 03/01/2018    Qualifies for Shingles Vaccine? Up to date  Screening Tests Health Maintenance  Topic Date Due  . INFLUENZA VACCINE  04/06/2019  . TETANUS/TDAP  02/24/2027  . PNA vac Low Risk Adult  Completed   Cancer Screenings: Lung: Low Dose CT Chest recommended if Age 7-80 years, 30 pack-year currently smoking OR have quit w/in 15years. Patient does not qualify. Colorectal: aged out  Additional Screenings: not indicated Hepatitis C Screening:      Plan:     I have personally reviewed and noted the following in the patient's chart:   . Medical and social history . Use of alcohol, tobacco or illicit drugs  . Current medications and supplements . Functional ability and status . Nutritional status . Physical activity . Advanced directives . List of other physicians . Hospitalizations, surgeries, and ER visits in previous 12 months . Vitals . Screenings to include cognitive, depression, and falls . Referrals and appointments  In addition, I have reviewed and discussed with patient certain preventive protocols, quality metrics, and best practice recommendations. A written personalized care plan for preventive services as well as general preventive health recommendations were provided to patient.     Royal Hawthorn, NP  04/05/2019

## 2019-06-06 DIAGNOSIS — Z20828 Contact with and (suspected) exposure to other viral communicable diseases: Secondary | ICD-10-CM | POA: Diagnosis not present

## 2019-06-13 DIAGNOSIS — Z9189 Other specified personal risk factors, not elsewhere classified: Secondary | ICD-10-CM | POA: Diagnosis not present

## 2019-06-19 DIAGNOSIS — Z9189 Other specified personal risk factors, not elsewhere classified: Secondary | ICD-10-CM | POA: Diagnosis not present

## 2019-06-25 DIAGNOSIS — Z9189 Other specified personal risk factors, not elsewhere classified: Secondary | ICD-10-CM | POA: Diagnosis not present

## 2019-07-02 DIAGNOSIS — Z20828 Contact with and (suspected) exposure to other viral communicable diseases: Secondary | ICD-10-CM | POA: Diagnosis not present

## 2019-07-09 DIAGNOSIS — Z9189 Other specified personal risk factors, not elsewhere classified: Secondary | ICD-10-CM | POA: Diagnosis not present

## 2019-07-09 DIAGNOSIS — Z20828 Contact with and (suspected) exposure to other viral communicable diseases: Secondary | ICD-10-CM | POA: Diagnosis not present

## 2019-07-17 DIAGNOSIS — Z9189 Other specified personal risk factors, not elsewhere classified: Secondary | ICD-10-CM | POA: Diagnosis not present

## 2019-07-22 ENCOUNTER — Telehealth: Payer: Self-pay | Admitting: Nurse Practitioner

## 2019-07-22 NOTE — Telephone Encounter (Signed)
(  late entry from Friday 07/19/2019) Was on called on call due to Gary [name] feeling Dizzy and HR at 41 manually. Nurse wanted to send him out to the hospital. She had laid him down after dinner and was monitoring him.  He is 85 year old and asked nurse to review his chart for any directives on hospitalization. Nurse agreed to discuss with pt, review chart and call family if needed. Discussed case with Dr Mariea Clonts pts PCP as well. After the fact Pt has MOST form and did not wish to go to the hospital. Requested to just be monitored in facility.

## 2019-07-31 ENCOUNTER — Other Ambulatory Visit: Payer: Self-pay

## 2019-07-31 DIAGNOSIS — Z20828 Contact with and (suspected) exposure to other viral communicable diseases: Secondary | ICD-10-CM | POA: Diagnosis not present

## 2019-07-31 DIAGNOSIS — Z9189 Other specified personal risk factors, not elsewhere classified: Secondary | ICD-10-CM | POA: Diagnosis not present

## 2019-08-07 ENCOUNTER — Encounter: Payer: Self-pay | Admitting: Internal Medicine

## 2019-08-07 ENCOUNTER — Other Ambulatory Visit: Payer: Self-pay

## 2019-08-07 ENCOUNTER — Non-Acute Institutional Stay: Payer: Medicare Other | Admitting: Internal Medicine

## 2019-08-07 VITALS — BP 126/70 | HR 52 | Temp 97.5°F | Ht 72.0 in | Wt 199.8 lb

## 2019-08-07 DIAGNOSIS — Z66 Do not resuscitate: Secondary | ICD-10-CM

## 2019-08-07 DIAGNOSIS — E039 Hypothyroidism, unspecified: Secondary | ICD-10-CM | POA: Diagnosis not present

## 2019-08-07 DIAGNOSIS — G4710 Hypersomnia, unspecified: Secondary | ICD-10-CM | POA: Insufficient documentation

## 2019-08-07 DIAGNOSIS — R001 Bradycardia, unspecified: Secondary | ICD-10-CM | POA: Diagnosis not present

## 2019-08-07 DIAGNOSIS — M170 Bilateral primary osteoarthritis of knee: Secondary | ICD-10-CM | POA: Diagnosis not present

## 2019-08-07 DIAGNOSIS — I509 Heart failure, unspecified: Secondary | ICD-10-CM | POA: Diagnosis not present

## 2019-08-07 DIAGNOSIS — I4811 Longstanding persistent atrial fibrillation: Secondary | ICD-10-CM | POA: Diagnosis not present

## 2019-08-07 DIAGNOSIS — K219 Gastro-esophageal reflux disease without esophagitis: Secondary | ICD-10-CM

## 2019-08-07 MED ORDER — METHYLPREDNISOLONE ACETATE 40 MG/ML IJ SUSP
40.0000 mg | Freq: Once | INTRAMUSCULAR | Status: AC
  Administered 2019-08-07: 40 mg via INTRA_ARTICULAR

## 2019-08-07 NOTE — Progress Notes (Signed)
Location:   [facility]   Place of Service:   clinic  Provider: Eydan Chianese L. Mariea Clonts, D.O., C.M.D.  Code Status: DNR Goals of Care:  Advanced Directives 08/07/2019  Does Patient Have a Medical Advance Directive? Yes  Type of Paramedic of [place];Living will;Out of facility DNR (pink MOST or yellow form)  Does patient want to make changes to medical advance directive? No - Patient declined  Copy of [place] in Chart? Yes - validated most recent copy scanned in chart (See row information)  Pre-existing out of facility DNR order (yellow form or pink MOST form) Yellow form placed in chart (order not valid for inpatient use)     Chief Complaint  Patient presents with  . Medical Management of Chronic Issues    4 month follow-up   . Sleeping Problem    Patient c/o sleeping too much. Patient apprears dull or in a fog when he awakes.   . Generalized Body Aches    Normal aches and pains, especially in knee's   . Advanced Directive    Reactivate DNR    HPI: Patient is a 85 y.o. male seen today for medical management of chronic diseases.    He is sleeping way too much.  He says he can hardly do anything else.  Goes to breakfast, comes home, uses the toilet, goes to sleep.  Can't get anything done on the computer or watch football b/c he's sleeping.  Falls asleep reading the paper.   Wants to read novels but falls asleep.  He did not have sleep apnea when tested in the past.    He does struggle to sleep at night due to knee pain.  He uses a lot of aspercreme.  Nursing reports he declines the tylenol--says it does not touch it so not worth it.  Gets up and sleeps 1.5 hrs in easy chair, then has to urinate every hour to hour and a half.  If he could solve one problem, it would be sleep.  Second is knees.  He also says he's foggy, not as sharp as he used to be.  He has some flashes of light in his eyes he does not like.  He uses ocusoft lid scrubs.    The flonase is not helping his congestion--he uses it twice a day.   He is happy to have lived so long. He is happy, his children love it and show it every day and every way.  He loves them, too.  He has a sense of accomplishment.  He wishes he did not have the aches and pains.    Past Medical History:  Diagnosis Date  . Abnormality of gait 02/2010  . Anal and rectal polyp 1980  . Atrial fibrillation ([place]) 11/2009  . Chronic kidney disease, stage III (moderate) 08/07/2013  . Deviated nasal septum 11/2009  . Diaphragmatic hernia without mention of obstruction or gangrene 2007  . Diarrhea 02/2010  . Dizziness and giddiness 2009  . Edema 2010  . Essential and other specified forms of tremor 2007  . 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 Hyperglycemia 01/02/2013  . Hypertrophy of prostate without urinary obstruction and other lower urinary tract symptoms (LUTS) 2002  . Insomnia with sleep apnea, unspecified 02/2010  . Irritable bowel syndrome 11/14/2011  . Long term (current) use of anticoagulants 11/2009  . Other abnormal blood chemistry 02/09/2009  . Other B-complex deficiencies 2008  . Other dyspnea and respiratory abnormality 11/2009  .  Other malaise and fatigue 2007  . Pain in joint, lower leg 02/2010  . Palpitations 2007  . Reflux esophagitis 2007  . Sebaceous cyst 2008  . Tension headache 2009  . Undiagnosed cardiac murmurs 2002  . Unspecified constipation 07/04/2012  . Unspecified essential hypertension 2007  . Unspecified hereditary and idiopathic peripheral neuropathy 02/2010  . Unspecified transient cerebral ischemia 2002  . Urinary frequency 10/01/2012    History reviewed. No pertinent surgical history.  Allergies  Allergen Reactions  . Plavix [Clopidogrel Bisulfate]     indigestion    Outpatient Encounter Medications as of 08/07/2019  Medication Sig  . Acetaminophen 500 MG coapsule Take 2 capsules by mouth at bedtime as needed.  Marland Kitchen apixaban (ELIQUIS)  2.5 MG TABS tablet Take 1 tablet (2.5 mg total) by mouth 2 (two) times daily.  Marland Kitchen doxazosin (CARDURA) 2 MG tablet Take 1 tablet (2 mg total) by mouth every morning.  . Eyelid Cleansers (OCUSOFT EYELID CLEANSING) PADS Apply topically as needed.  . furosemide (LASIX) 40 MG tablet Take 40 mg by mouth daily. NO LASIX ON Saturday OR Sunday  . Lactase (LACTAID FAST ACT) 9000 units CHEW chew 1 tab (9,000 u) by mouth w/ first bite of milk product PRN lactose intolerant.  . Levothyroxine Sodium 50 MCG CAPS Take 1 capsule (50 mcg total) by mouth daily before breakfast.  . metoprolol succinate (TOPROL-XL) 25 MG 24 hr tablet Take 12.5 mg by mouth as needed (For palpitations).   . mineral oil-hydrophilic petrolatum (AQUAPHOR) ointment Apply 1 application topically 2 (two) times daily as needed for dry skin.  . mupirocin ointment (BACTROBAN) 2 % Apply 1 application topically 2 (two) times daily. Apply to left buttock skin tag PRN no more than 2 times a day until healed  . omeprazole (PRILOSEC OTC) 20 MG tablet Take 20 mg by mouth 2 (two) times daily. Before breakfast/dinner  . ondansetron (ZOFRAN) 4 MG tablet Take 4 mg by mouth every 6 (six) hours as needed for nausea or vomiting.  . Polyvinyl Alcohol-Povidone PF 1.4-0.6 % SOLN Apply 1.4 % to eye 2 (two) times daily.   . potassium chloride 20 MEQ/15ML (10%) SOLN Take 15 mLs by mouth daily. On Mon thru Fri, none on sat or sun  . trolamine salicylate (ASPERCREME) 10 % cream Apply 1 application topically 2 (two) times daily as needed.  . vitamin B-12 (CYANOCOBALAMIN) 1000 MCG tablet Take 1,000 mcg by mouth daily.   No facility-administered encounter medications on file as of 08/07/2019.     Review of Systems:  Review of Systems  Constitutional: Positive for malaise/fatigue. Negative for chills, fever and weight loss.  HENT: Positive for hearing loss. Negative for congestion.   Eyes: Negative for blurred vision.       Glasses, flashes of light in eyes   Respiratory: Negative for cough and shortness of breath.   Cardiovascular: Negative for chest pain, palpitations and leg swelling.  Gastrointestinal: Negative for abdominal pain, blood in stool, constipation, diarrhea and melena.  Genitourinary: Positive for frequency. Negative for dysuria.  Musculoskeletal: Positive for joint pain. Negative for falls.  Skin: Negative for itching and rash.  Neurological: Negative for dizziness and loss of consciousness.       Had a spell of lightheadedness with bradycardia  Endo/Heme/Allergies: Bruises/bleeds easily.  Psychiatric/Behavioral: Negative for depression and memory loss. The patient is not nervous/anxious and does not have insomnia.        "I'm not as sharp as I used to be"  Health Maintenance  Topic Date Due  . TETANUS/TDAP  02/24/2027  . INFLUENZA VACCINE  Completed  . PNA vac Low Risk Adult  Completed    Physical Exam: Vitals:   08/07/19 0953  BP: 126/70  Pulse: (!) 52  Temp: (!) 97.5 F (36.4 C)  TempSrc: Temporal  SpO2: 98%  Weight: 199 lb 12.8 oz (90.6 kg)  Height: 6' (1.829 m)   Body mass index is 27.1 kg/m. Physical Exam Vitals signs reviewed.  Constitutional:      General: He is not in acute distress.    Appearance: Normal appearance. He is not toxic-appearing.  HENT:     Head: Normocephalic and atraumatic.  Eyes:     Extraocular Movements: Extraocular movements intact.     Pupils: Pupils are equal, round, and reactive to light.  Cardiovascular:     Rate and Rhythm: Bradycardia present. Rhythm irregular.     Heart sounds: Murmur present.  Pulmonary:     Effort: Pulmonary effort is normal.     Breath sounds: Normal breath sounds. No wheezing, rhonchi or rales.  Abdominal:     General: Bowel sounds are normal.  Musculoskeletal:        General: Tenderness present.     Right lower leg: Edema present.     Left lower leg: Edema present.     Comments: Only 1+ edema, had been much worse prior visits; tender over  medial knees, left greater than right with small effusion present; came in power scooter, walks short distances with rolling walker  Skin:    General: Skin is warm and dry.     Capillary Refill: Capillary refill takes less than 2 seconds.  Neurological:     General: No focal deficit present.     Mental Status: He is alert and oriented to person, place, and time.  Psychiatric:        Mood and Affect: Mood normal.        Behavior: Behavior normal.        Thought Content: Thought content normal.        Judgment: Judgment normal.     Labs reviewed: Basic Metabolic Panel: No results for input(s): NA, K, CL, CO2, GLUCOSE, BUN, CREATININE, CALCIUM, MG, PHOS, TSH in the last 8760 hours. Liver Function Tests: No results for input(s): AST, ALT, ALKPHOS, BILITOT, PROT, ALBUMIN in the last 8760 hours. No results for input(s): LIPASE, AMYLASE in the last 8760 hours. No results for input(s): AMMONIA in the last 8760 hours. CBC: No results for input(s): WBC, NEUTROABS, HGB, HCT, MCV, PLT in the last 8760 hours. Lipid Panel: No results for input(s): CHOL, HDL, LDLCALC, TRIG, CHOLHDL, LDLDIRECT in the last 8760 hours. Lab Results  Component Value Date   HGBA1C 5.3 03/01/2016     Assessment/Plan 1. Bilateral primary osteoarthritis of knee -not responsive to tylenol or topicals adequately--it's affecting his sleep -we opted to try bilateral steroid knee injections  -Procedure note left knee injection verbal consent was obtained to inject left knee joint  Timeout was completed to confirm the site of injection  The medications used were 40 mg of Depo-Medrol and 1% lidocaine 2 cc  Anesthesia was provided by ethyl chloride and the skin was prepped with betadine.  After cleaning the skin with betadine, a 20-gauge needle was used to inject the left knee joint. There were no complications. A sterile bandage was applied.   Procedure note right knee injection verbal consent was obtained to inject  right knee joint  Timeout was completed to confirm the site of injection  The medications used were 40 mg of Depo-Medrol and 1% lidocaine 2 cc  Anesthesia was provided by ethyl chloride and the skin was prepped with betadine.  After cleaning the skin with betadine a 20-gauge needle was used to inject the right knee joint. There were no complications. A sterile bandage was applied.  2. Chronic congestive heart failure, unspecified heart failure type ([place]) -no signs of acute exacerbation -seems under better control now w/ less edema and clear lungs -cont current diuretic regimen  3. Longstanding persistent atrial fibrillation (HCC) -rate controlled, sometimes runs low -does not want further interventions at 100 nearly 101  4. Bradycardia -had episode of near syncope due to this, but resolved with rest and hydration  5. Acquired hypothyroidism -cont current levothyroxine Lab Results  Component Value Date   TSH 3.11 07/20/2018   6. Excessive sleepiness -had prior sleep study that was negative -he does have a large abdominal prominence now than then, but does not want this reassessed -sleeps b/w meals quite a bit some of which may be due to fatigue due to advanced age  14. DNR (do not resuscitate) - DNR (Do Not Resuscitate) order renewed  8.  GERD:  Reports no recent symptoms, but does talk about belching -wants to reduce omeprazole -will try to cut down to only before evening meal (stop breakfast dose) and monitor  Labs/tests ordered:  No new Next appt:  4 mos med mgt and possibly for another set of knee injections if needed  Rohil Lesch L. Ivionna Verley, D.O. [facility] [address], [place] [address] Cell Phone (Mon-Fri 8am-5pm):  [phone] On Call:  [phone] & follow prompts after 5pm & weekends Office Phone:  [phone] Office Fax:  [phone]

## 2019-08-19 DIAGNOSIS — Z20828 Contact with and (suspected) exposure to other viral communicable diseases: Secondary | ICD-10-CM | POA: Diagnosis not present

## 2019-08-19 DIAGNOSIS — Z9189 Other specified personal risk factors, not elsewhere classified: Secondary | ICD-10-CM | POA: Diagnosis not present

## 2019-09-04 DIAGNOSIS — M62562 Muscle wasting and atrophy, not elsewhere classified, left lower leg: Secondary | ICD-10-CM | POA: Diagnosis not present

## 2019-09-04 DIAGNOSIS — Z9181 History of falling: Secondary | ICD-10-CM | POA: Diagnosis not present

## 2019-09-04 DIAGNOSIS — M25562 Pain in left knee: Secondary | ICD-10-CM | POA: Diagnosis not present

## 2019-09-04 DIAGNOSIS — R278 Other lack of coordination: Secondary | ICD-10-CM | POA: Diagnosis not present

## 2019-09-04 DIAGNOSIS — R2689 Other abnormalities of gait and mobility: Secondary | ICD-10-CM | POA: Diagnosis not present

## 2019-09-04 DIAGNOSIS — M1711 Unilateral primary osteoarthritis, right knee: Secondary | ICD-10-CM | POA: Diagnosis not present

## 2019-09-04 DIAGNOSIS — M62561 Muscle wasting and atrophy, not elsewhere classified, right lower leg: Secondary | ICD-10-CM | POA: Diagnosis not present

## 2019-09-04 DIAGNOSIS — N183 Chronic kidney disease, stage 3 unspecified: Secondary | ICD-10-CM | POA: Diagnosis not present

## 2019-09-05 DIAGNOSIS — M1711 Unilateral primary osteoarthritis, right knee: Secondary | ICD-10-CM | POA: Diagnosis not present

## 2019-09-05 DIAGNOSIS — M62562 Muscle wasting and atrophy, not elsewhere classified, left lower leg: Secondary | ICD-10-CM | POA: Diagnosis not present

## 2019-09-05 DIAGNOSIS — N183 Chronic kidney disease, stage 3 unspecified: Secondary | ICD-10-CM | POA: Diagnosis not present

## 2019-09-05 DIAGNOSIS — R2689 Other abnormalities of gait and mobility: Secondary | ICD-10-CM | POA: Diagnosis not present

## 2019-09-05 DIAGNOSIS — M62561 Muscle wasting and atrophy, not elsewhere classified, right lower leg: Secondary | ICD-10-CM | POA: Diagnosis not present

## 2019-09-05 DIAGNOSIS — R278 Other lack of coordination: Secondary | ICD-10-CM | POA: Diagnosis not present

## 2019-09-09 DIAGNOSIS — R2689 Other abnormalities of gait and mobility: Secondary | ICD-10-CM | POA: Diagnosis not present

## 2019-09-09 DIAGNOSIS — R278 Other lack of coordination: Secondary | ICD-10-CM | POA: Diagnosis not present

## 2019-09-09 DIAGNOSIS — M1711 Unilateral primary osteoarthritis, right knee: Secondary | ICD-10-CM | POA: Diagnosis not present

## 2019-09-09 DIAGNOSIS — Z9181 History of falling: Secondary | ICD-10-CM | POA: Diagnosis not present

## 2019-09-09 DIAGNOSIS — M25562 Pain in left knee: Secondary | ICD-10-CM | POA: Diagnosis not present

## 2019-09-09 DIAGNOSIS — M62561 Muscle wasting and atrophy, not elsewhere classified, right lower leg: Secondary | ICD-10-CM | POA: Diagnosis not present

## 2019-09-09 DIAGNOSIS — M62562 Muscle wasting and atrophy, not elsewhere classified, left lower leg: Secondary | ICD-10-CM | POA: Diagnosis not present

## 2019-09-12 DIAGNOSIS — M62561 Muscle wasting and atrophy, not elsewhere classified, right lower leg: Secondary | ICD-10-CM | POA: Diagnosis not present

## 2019-09-12 DIAGNOSIS — R278 Other lack of coordination: Secondary | ICD-10-CM | POA: Diagnosis not present

## 2019-09-12 DIAGNOSIS — M25562 Pain in left knee: Secondary | ICD-10-CM | POA: Diagnosis not present

## 2019-09-12 DIAGNOSIS — M62562 Muscle wasting and atrophy, not elsewhere classified, left lower leg: Secondary | ICD-10-CM | POA: Diagnosis not present

## 2019-09-12 DIAGNOSIS — R2689 Other abnormalities of gait and mobility: Secondary | ICD-10-CM | POA: Diagnosis not present

## 2019-09-12 DIAGNOSIS — M1711 Unilateral primary osteoarthritis, right knee: Secondary | ICD-10-CM | POA: Diagnosis not present

## 2019-09-16 DIAGNOSIS — Z20828 Contact with and (suspected) exposure to other viral communicable diseases: Secondary | ICD-10-CM | POA: Diagnosis not present

## 2019-09-16 DIAGNOSIS — Z9189 Other specified personal risk factors, not elsewhere classified: Secondary | ICD-10-CM | POA: Diagnosis not present

## 2019-09-17 DIAGNOSIS — Z23 Encounter for immunization: Secondary | ICD-10-CM | POA: Diagnosis not present

## 2019-09-19 DIAGNOSIS — R2689 Other abnormalities of gait and mobility: Secondary | ICD-10-CM | POA: Diagnosis not present

## 2019-09-19 DIAGNOSIS — R278 Other lack of coordination: Secondary | ICD-10-CM | POA: Diagnosis not present

## 2019-09-19 DIAGNOSIS — M62562 Muscle wasting and atrophy, not elsewhere classified, left lower leg: Secondary | ICD-10-CM | POA: Diagnosis not present

## 2019-09-19 DIAGNOSIS — M25562 Pain in left knee: Secondary | ICD-10-CM | POA: Diagnosis not present

## 2019-09-19 DIAGNOSIS — M1711 Unilateral primary osteoarthritis, right knee: Secondary | ICD-10-CM | POA: Diagnosis not present

## 2019-09-19 DIAGNOSIS — M62561 Muscle wasting and atrophy, not elsewhere classified, right lower leg: Secondary | ICD-10-CM | POA: Diagnosis not present

## 2019-09-23 DIAGNOSIS — Z20828 Contact with and (suspected) exposure to other viral communicable diseases: Secondary | ICD-10-CM | POA: Diagnosis not present

## 2019-09-23 DIAGNOSIS — Z9189 Other specified personal risk factors, not elsewhere classified: Secondary | ICD-10-CM | POA: Diagnosis not present

## 2019-09-30 DIAGNOSIS — Z20828 Contact with and (suspected) exposure to other viral communicable diseases: Secondary | ICD-10-CM | POA: Diagnosis not present

## 2019-09-30 DIAGNOSIS — Z9189 Other specified personal risk factors, not elsewhere classified: Secondary | ICD-10-CM | POA: Diagnosis not present

## 2019-10-07 DIAGNOSIS — Z9189 Other specified personal risk factors, not elsewhere classified: Secondary | ICD-10-CM | POA: Diagnosis not present

## 2019-10-07 DIAGNOSIS — Z20828 Contact with and (suspected) exposure to other viral communicable diseases: Secondary | ICD-10-CM | POA: Diagnosis not present

## 2019-10-08 ENCOUNTER — Non-Acute Institutional Stay: Payer: Medicare Other | Admitting: Internal Medicine

## 2019-10-08 ENCOUNTER — Encounter: Payer: Self-pay | Admitting: Internal Medicine

## 2019-10-08 DIAGNOSIS — I359 Nonrheumatic aortic valve disorder, unspecified: Secondary | ICD-10-CM

## 2019-10-08 DIAGNOSIS — R001 Bradycardia, unspecified: Secondary | ICD-10-CM

## 2019-10-08 DIAGNOSIS — I480 Paroxysmal atrial fibrillation: Secondary | ICD-10-CM

## 2019-10-08 DIAGNOSIS — R531 Weakness: Secondary | ICD-10-CM

## 2019-10-08 DIAGNOSIS — M170 Bilateral primary osteoarthritis of knee: Secondary | ICD-10-CM

## 2019-10-08 DIAGNOSIS — E039 Hypothyroidism, unspecified: Secondary | ICD-10-CM | POA: Diagnosis not present

## 2019-10-08 DIAGNOSIS — I509 Heart failure, unspecified: Secondary | ICD-10-CM | POA: Diagnosis not present

## 2019-10-08 NOTE — Progress Notes (Addendum)
Patient ID: Gary [name], male   DOB: 1916/05/14, 86 y.o.   MRN: [id number]  Location:  [facility] Room Number: [id number] Place of Service:  ALF [phone]) Provider:   Gayland Curry, DO  Patient Care Team: Gayland Curry, DO as PCP - General (Geriatric Medicine) [name], DDS as Consulting Physician (Oral Surgery)  Extended Emergency Contact Information Primary Emergency Contact: Fang,Kemani Address: [address] [place], [place] [address] [name] of [place] Phone: [phone] Mobile Phone: [phone] Relation: Son Secondary Emergency Contact: Cutrone,[name] Address: [address]          [place], [place] [address] [name] of [place] Mobile Phone: [phone] Relation: Daughter  Code Status:  DNR, MOST Goals of care: Advanced Directive information Advanced Directives 10/08/2019  Does Patient Have a Medical Advance Directive? Yes  Type of Advance Directive Out of facility DNR (pink MOST or yellow form)  Does patient want to make changes to medical advance directive? No - Patient declined  Copy of [place] in Chart? -  Pre-existing out of facility DNR order (yellow form or pink MOST form) -   Chief Complaint  Patient presents with  . Acute Visit    Dizziness, increased swelling, lower extremity weakness requiring -- up lift    HPI:  Pt is a 86 y.o. male with h/o chronic diastolic chf, bradycardia, paroxysmal afib, aortic valve disorder, hypothyroidism, OA of right knee, chronic excessive daytime sleepiness, constipation seen today for an acute visit for dizziness, increased swelling, and lower extremity weakness requiring a stand-up lift for transfers for the past 6 days.  He has for many years been resistant to more diuretics despite some rales at his bases and variable edema from 2-4+ in his lower legs and feet. He already gets up frequently to urinate.  His aortic  valve was abnormal on an echocardiogram, but he did not want it further evaluated at his advanced age.  He goes in and out of afib and his heart rate runs in the 50s lately (52-58 this past month).  He has gained 5 lbs but now weighs the same as at his last clinic visit with me.  His bp varies from 116/54 to 156/69 during his dizzy spells.  He denies spinning of the room--it's more lightheaded.  It happens at sporadic times.    As per nursing notes, 1/12, he had dizziness in the morning and bp 138/62, HR 54, 1/18 weak after breakfast but not dizzy, 1/29 dizzy in the am and dizzy and weak at 9pm--BP then was high at 184/63, HR 56.  1/30, he felt better, not dizzy, but still weak in the legs.  2/2, continues with weakness in legs, but no dizziness.  He is a tall man of larger stature so transferring him has required gait belt, two people and the stand-up lift lately.  Each time he was weak and dizzy, nursing encouraged hydration and he seemed to improve gradually.    When seen, he reports the dizziness has "resolved" from last month, but he continues to be weak in his legs that he cannot support himself.  He actually had PT earlier in Jan and did better for a bit, but then declined again.  He also admits to periods of palpitations of his heart that seem to mostly be early am when he first awakens and occasionally at meals.  They do not correlate  with his dizziness or weakness.  He is not sob, but he's not active either since covid.  Previously would use his "stroller"--rolling walker with skis in his apt and to transfer to his scooter; however, he is not capable of this any longer.  His knees only hurt at night and he likes his routine of aspercreme when he wakes up in pain which quickly relieves the pain and he can go back to sleep.  Tylenol at hs did not seem to make much difference.  He was refusing it so I eventually stopped it.  He also tells me he has occasional chest pains.  He knows one day one of them  will take him out.  Of course, he is very clear he does not want investigations done, only symptom management at 86 years old.   Past Medical History:  Diagnosis Date  . Abnormality of gait 02/2010  . Anal and rectal polyp 1980  . Atrial fibrillation ([place]) 11/2009  . Chronic kidney disease, stage III (moderate) 08/07/2013  . Deviated nasal septum 11/2009  . Diaphragmatic hernia without mention of obstruction or gangrene 2007  . Diarrhea 02/2010  . Dizziness and giddiness 2009  . Edema 2010  . Essential and other specified forms of tremor 2007  . 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 Hyperglycemia 01/02/2013  . Hypertrophy of prostate without urinary obstruction and other lower urinary tract symptoms (LUTS) 2002  . Insomnia with sleep apnea, unspecified 02/2010  . Irritable bowel syndrome 11/14/2011  . Long term (current) use of anticoagulants 11/2009  . Other abnormal blood chemistry 02/09/2009  . Other B-complex deficiencies 2008  . Other dyspnea and respiratory abnormality 11/2009  . Other malaise and fatigue 2007  . Pain in joint, lower leg 02/2010  . Palpitations 2007  . Reflux esophagitis 2007  . Sebaceous cyst 2008  . Tension headache 2009  . Undiagnosed cardiac murmurs 2002  . Unspecified constipation 07/04/2012  . Unspecified essential hypertension 2007  . Unspecified hereditary and idiopathic peripheral neuropathy 02/2010  . Unspecified transient cerebral ischemia 2002  . Urinary frequency 10/01/2012   History reviewed. No pertinent surgical history.  Allergies  Allergen Reactions  . Plavix [Clopidogrel Bisulfate]     indigestion    Outpatient Encounter Medications as of 10/08/2019  Medication Sig  . Acetaminophen 500 MG coapsule Take 2 capsules by mouth at bedtime as needed.  . Eyelid Cleansers (OCUSOFT EYELID CLEANSING) PADS Apply topically as needed.  . fluticasone (FLONASE ALLERGY RELIEF) 50 MCG/ACT nasal spray Place 2 sprays into both nostrils 2  (two) times daily.  . furosemide (LASIX) 40 MG tablet Take 40 mg by mouth daily. NO LASIX ON Saturday OR Sunday  . Lactase (LACTAID FAST ACT) 9000 units CHEW chew 1 tab (9,000 u) by mouth w/ first bite of milk product PRN lactose intolerant.  . Levothyroxine Sodium 50 MCG CAPS Take 1 capsule (50 mcg total) by mouth daily before breakfast.  . metoprolol succinate (TOPROL-XL) 25 MG 24 hr tablet Take 12.5 mg by mouth as needed (For palpitations).   . mineral oil-hydrophilic petrolatum (AQUAPHOR) ointment Apply 1 application topically 2 (two) times daily as needed for dry skin.  . mupirocin ointment (BACTROBAN) 2 % Apply 1 application topically 2 (two) times daily. Apply to left buttock skin tag PRN no more than 2 times a day until healed  . omeprazole (PRILOSEC OTC) 20 MG tablet Take 20 mg by mouth 2 (two) times daily. Before breakfast/dinner  . ondansetron (  ZOFRAN) 4 MG tablet Take 4 mg by mouth every 6 (six) hours as needed for nausea or vomiting.  . Polyvinyl Alcohol-Povidone PF 1.4-0.6 % SOLN Apply 1.4 % to eye 2 (two) times daily.   . potassium chloride 20 MEQ/15ML (10%) SOLN Take 15 mLs by mouth daily. On Mon thru Fri, none on sat or sun  . trolamine salicylate (ASPERCREME) 10 % cream Apply 1 application topically 2 (two) times daily as needed.  . vitamin B-12 (CYANOCOBALAMIN) 1000 MCG tablet Take 1,000 mcg by mouth daily.  . [DISCONTINUED] apixaban (ELIQUIS) 2.5 MG TABS tablet Take 1 tablet (2.5 mg total) by mouth 2 (two) times daily.  . [DISCONTINUED] doxazosin (CARDURA) 2 MG tablet Take 1 tablet (2 mg total) by mouth every morning.   No facility-administered encounter medications on file as of 10/08/2019.    Review of Systems  Constitutional: Positive for activity change, appetite change, fatigue and unexpected weight change. Negative for chills and fever.       Reports eating less  HENT: Positive for hearing loss. Negative for congestion and trouble swallowing.   Eyes: Negative for visual  disturbance.  Respiratory: Negative for cough and shortness of breath.   Cardiovascular: Positive for chest pain, palpitations and leg swelling.  Gastrointestinal: Negative for abdominal pain, constipation, diarrhea, nausea and vomiting.  Genitourinary: Negative for dysuria.  Musculoskeletal: Positive for arthralgias and gait problem. Negative for back pain.  Skin: Negative for color change.    Immunization History  Administered Date(s) Administered  . Influenza Inj Mdck Quad Pf 06/23/2016  . Influenza, High Dose Seasonal PF 07/04/2019  . Influenza,inj,Quad PF,6+ Mos 07/05/2018  . Influenza-Unspecified 06/11/2013, 06/23/2014, 06/25/2015, 06/29/2017  . Pneumococcal Conjugate-13 09/09/2015  . Pneumococcal Polysaccharide-23 09/05/2008  . Td 09/05/1978  . Tdap 02/23/2017  . Zoster Recombinat (Shingrix) 10/02/2017, 03/01/2018   Pertinent  Health Maintenance Due  Topic Date Due  . INFLUENZA VACCINE  Completed  . PNA vac Low Risk Adult  Completed   Fall Risk  07/31/2019 04/05/2019 04/03/2019 11/21/2018 07/18/2018  Falls in the past year? 0 0 0 0 1  Comment Emmi Telephone Survey: data to providers prior to load - - - fall in transfer from walker to chair  Number falls in past yr: - 0 0 0 0  Injury with Fall? - 0 0 0 0  Risk for fall due to : - Impaired balance/gait - - -  Follow up - Falls evaluation completed;Education provided - - -       Vitals:   10/08/19 1333  BP: 123/60  Temp: (!) 97.5 F (36.4 C)  SpO2: 94%  Weight: 199 lb 9.6 oz (90.5 kg)  Height: 6' (1.829 m)   Body mass index is 27.07 kg/m. Physical Exam Vitals and nursing note reviewed.  Constitutional:      General: He is not in acute distress.    Appearance: Normal appearance. He is not toxic-appearing.     Comments: Appears thinner up top, continues to have protuberant abdomen; 4+ edema of legs  HENT:     Head: Normocephalic and atraumatic.     Ears:     Comments: HOH Eyes:     Comments: glasses    Cardiovascular:     Rate and Rhythm: Regular rhythm. Bradycardia present.     Heart sounds: Murmur present.     Comments: At present when pulse checked radially and apically Pulmonary:     Effort: Pulmonary effort is normal. No respiratory distress.     Breath  sounds: Rales present. No wheezing or rhonchi.     Comments: At left base Abdominal:     General: Bowel sounds are normal.     Palpations: Abdomen is soft. There is no mass.     Tenderness: There is no abdominal tenderness. There is no guarding or rebound.  Musculoskeletal:        General: No tenderness. Normal range of motion.     Cervical back: Neck supple.     Right lower leg: Edema present.     Left lower leg: Edema present.  Lymphadenopathy:     Cervical: No cervical adenopathy.  Skin:    General: Skin is warm and dry.  Neurological:     General: No focal deficit present.     Mental Status: He is alert and oriented to person, place, and time. Mental status is at baseline.     Cranial Nerves: No cranial nerve deficit.     Motor: Weakness present.     Coordination: Coordination normal.     Gait: Gait abnormal.  Psychiatric:        Mood and Affect: Mood normal.        Behavior: Behavior normal.     Labs reviewed: No results for input(s): NA, K, CL, CO2, GLUCOSE, BUN, CREATININE, CALCIUM, MG, PHOS in the last 8760 hours. No results for input(s): AST, ALT, ALKPHOS, BILITOT, PROT, ALBUMIN in the last 8760 hours. No results for input(s): WBC, NEUTROABS, HGB, HCT, MCV, PLT in the last 8760 hours. Lab Results  Component Value Date   TSH 3.11 07/20/2018   Lab Results  Component Value Date   HGBA1C 5.3 03/01/2016   Lab Results  Component Value Date   CHOL 119 03/01/2016   HDL 56 03/01/2016   LDLCALC 52 03/01/2016   TRIG 54 03/01/2016   Assessment/Plan 1. Chronic congestive heart failure, unspecified heart failure type ([place]) -on lasix 40mg  po daily except weekends -he is quite bothered by urinary frequency  that goes with it -he's been running hypervolemic for a couple of years now  -does likely contribute to malaise and fatigue, weakness  2. Bradycardia -has been in the 50s lately not lower where I'd expect him to be symptomatic during his dizziness events  3. Aortic valve disorder -he refused further imaging with specific echo for aortic stenosis in the past due to his advanced age and not wanting intervention if it showed anything anyway -could also be worsening and causing symptoms  4. Paroxysmal A-fib ([facility]) -remains on eliquis therapy -remains on rate control with metoprolol and rate when checked satisfactory  -I suspect he's having some tachy-brady going in an out of afib and that's causing his dizziness vs if he's not hydrating well enough (however, is clearly hypervolemic on exam--chronically so)  5. Acquired hypothyroidism -thyroid was in range but over a year ago--will recheck in case this is contributing to symptoms and worsening chf Lab Results  Component Value Date   TSH 3.11 07/20/2018   6. Bilateral primary osteoarthritis of knee -cont aspercreme use, may have tylenol if desires but feels it does not help much, prior gi bleeding precludes nsaids and he doesn't want stronger meds   7. Generalized weakness -now using stand-up lift past 6 days, due to advanced age, possibly a role of his afib, aortic valve, chf and thyroid so will check basic labs  -may need higher level of care, but he is in opposition to that idea at present  Family/ staff Communication: discussed with AL nurse,  ED  Labs/tests ordered:  TSH, BMP, BNP  Note, eliquis and cardura were incorrectly removed from his medication list during this encounter.  I have reentered them and addended this note.  Divon Krabill L. Cleo Santucci, D.O. [facility] [address], [place] [address] Cell Phone (Mon-Fri 8am-5pm):  [phone] On Call:  [phone] & follow prompts  after 5pm & weekends Office Phone:  [phone] Office Fax:  [phone]

## 2019-10-09 ENCOUNTER — Encounter: Payer: Self-pay | Admitting: Internal Medicine

## 2019-10-09 DIAGNOSIS — D649 Anemia, unspecified: Secondary | ICD-10-CM | POA: Diagnosis not present

## 2019-10-09 DIAGNOSIS — I509 Heart failure, unspecified: Secondary | ICD-10-CM | POA: Diagnosis not present

## 2019-10-09 DIAGNOSIS — R0602 Shortness of breath: Secondary | ICD-10-CM | POA: Diagnosis not present

## 2019-10-09 DIAGNOSIS — E210 Primary hyperparathyroidism: Secondary | ICD-10-CM | POA: Diagnosis not present

## 2019-10-09 DIAGNOSIS — E039 Hypothyroidism, unspecified: Secondary | ICD-10-CM | POA: Diagnosis not present

## 2019-10-09 DIAGNOSIS — N189 Chronic kidney disease, unspecified: Secondary | ICD-10-CM | POA: Diagnosis not present

## 2019-10-09 LAB — BASIC METABOLIC PANEL
BUN: 39 — AB (ref 4–21)
CO2: 24 — AB (ref 13–22)
Chloride: 106 (ref 99–108)
Creatinine: 1.6 — AB (ref 0.6–1.3)
Glucose: 143
Potassium: 4.1 (ref 3.4–5.3)
Sodium: 144 (ref 137–147)

## 2019-10-09 LAB — TSH: TSH: 2.59 (ref 0.41–5.90)

## 2019-10-09 LAB — COMPREHENSIVE METABOLIC PANEL: Calcium: 8.7 (ref 8.7–10.7)

## 2019-10-14 ENCOUNTER — Encounter: Payer: Self-pay | Admitting: Internal Medicine

## 2019-10-15 DIAGNOSIS — N39498 Other specified urinary incontinence: Secondary | ICD-10-CM | POA: Diagnosis not present

## 2019-10-15 DIAGNOSIS — M1711 Unilateral primary osteoarthritis, right knee: Secondary | ICD-10-CM | POA: Diagnosis not present

## 2019-10-15 DIAGNOSIS — K5989 Other specified functional intestinal disorders: Secondary | ICD-10-CM | POA: Diagnosis not present

## 2019-10-15 DIAGNOSIS — M6389 Disorders of muscle in diseases classified elsewhere, multiple sites: Secondary | ICD-10-CM | POA: Diagnosis not present

## 2019-10-15 DIAGNOSIS — I482 Chronic atrial fibrillation, unspecified: Secondary | ICD-10-CM | POA: Diagnosis not present

## 2019-10-15 DIAGNOSIS — R601 Generalized edema: Secondary | ICD-10-CM | POA: Diagnosis not present

## 2019-10-15 DIAGNOSIS — M25361 Other instability, right knee: Secondary | ICD-10-CM | POA: Diagnosis not present

## 2019-10-15 DIAGNOSIS — R278 Other lack of coordination: Secondary | ICD-10-CM | POA: Diagnosis not present

## 2019-10-15 DIAGNOSIS — I9589 Other hypotension: Secondary | ICD-10-CM | POA: Diagnosis not present

## 2019-10-16 DIAGNOSIS — M6389 Disorders of muscle in diseases classified elsewhere, multiple sites: Secondary | ICD-10-CM | POA: Diagnosis not present

## 2019-10-16 DIAGNOSIS — Z23 Encounter for immunization: Secondary | ICD-10-CM | POA: Diagnosis not present

## 2019-10-16 DIAGNOSIS — R601 Generalized edema: Secondary | ICD-10-CM | POA: Diagnosis not present

## 2019-10-16 DIAGNOSIS — M25361 Other instability, right knee: Secondary | ICD-10-CM | POA: Diagnosis not present

## 2019-10-16 DIAGNOSIS — R278 Other lack of coordination: Secondary | ICD-10-CM | POA: Diagnosis not present

## 2019-10-16 DIAGNOSIS — N39498 Other specified urinary incontinence: Secondary | ICD-10-CM | POA: Diagnosis not present

## 2019-10-16 DIAGNOSIS — M1711 Unilateral primary osteoarthritis, right knee: Secondary | ICD-10-CM | POA: Diagnosis not present

## 2019-10-17 DIAGNOSIS — R278 Other lack of coordination: Secondary | ICD-10-CM | POA: Diagnosis not present

## 2019-10-17 DIAGNOSIS — R601 Generalized edema: Secondary | ICD-10-CM | POA: Diagnosis not present

## 2019-10-17 DIAGNOSIS — M6389 Disorders of muscle in diseases classified elsewhere, multiple sites: Secondary | ICD-10-CM | POA: Diagnosis not present

## 2019-10-17 DIAGNOSIS — M25361 Other instability, right knee: Secondary | ICD-10-CM | POA: Diagnosis not present

## 2019-10-17 DIAGNOSIS — M1711 Unilateral primary osteoarthritis, right knee: Secondary | ICD-10-CM | POA: Diagnosis not present

## 2019-10-17 DIAGNOSIS — N39498 Other specified urinary incontinence: Secondary | ICD-10-CM | POA: Diagnosis not present

## 2019-10-18 DIAGNOSIS — M1711 Unilateral primary osteoarthritis, right knee: Secondary | ICD-10-CM | POA: Diagnosis not present

## 2019-10-18 DIAGNOSIS — N39498 Other specified urinary incontinence: Secondary | ICD-10-CM | POA: Diagnosis not present

## 2019-10-18 DIAGNOSIS — R278 Other lack of coordination: Secondary | ICD-10-CM | POA: Diagnosis not present

## 2019-10-18 DIAGNOSIS — R601 Generalized edema: Secondary | ICD-10-CM | POA: Diagnosis not present

## 2019-10-18 DIAGNOSIS — M25361 Other instability, right knee: Secondary | ICD-10-CM | POA: Diagnosis not present

## 2019-10-18 DIAGNOSIS — M6389 Disorders of muscle in diseases classified elsewhere, multiple sites: Secondary | ICD-10-CM | POA: Diagnosis not present

## 2019-10-21 DIAGNOSIS — M1711 Unilateral primary osteoarthritis, right knee: Secondary | ICD-10-CM | POA: Diagnosis not present

## 2019-10-21 DIAGNOSIS — R278 Other lack of coordination: Secondary | ICD-10-CM | POA: Diagnosis not present

## 2019-10-21 DIAGNOSIS — M6389 Disorders of muscle in diseases classified elsewhere, multiple sites: Secondary | ICD-10-CM | POA: Diagnosis not present

## 2019-10-21 DIAGNOSIS — N39498 Other specified urinary incontinence: Secondary | ICD-10-CM | POA: Diagnosis not present

## 2019-10-21 DIAGNOSIS — M25361 Other instability, right knee: Secondary | ICD-10-CM | POA: Diagnosis not present

## 2019-10-21 DIAGNOSIS — R601 Generalized edema: Secondary | ICD-10-CM | POA: Diagnosis not present

## 2019-10-22 DIAGNOSIS — M25361 Other instability, right knee: Secondary | ICD-10-CM | POA: Diagnosis not present

## 2019-10-22 DIAGNOSIS — N39498 Other specified urinary incontinence: Secondary | ICD-10-CM | POA: Diagnosis not present

## 2019-10-22 DIAGNOSIS — R278 Other lack of coordination: Secondary | ICD-10-CM | POA: Diagnosis not present

## 2019-10-22 DIAGNOSIS — R601 Generalized edema: Secondary | ICD-10-CM | POA: Diagnosis not present

## 2019-10-22 DIAGNOSIS — M1711 Unilateral primary osteoarthritis, right knee: Secondary | ICD-10-CM | POA: Diagnosis not present

## 2019-10-22 DIAGNOSIS — M6389 Disorders of muscle in diseases classified elsewhere, multiple sites: Secondary | ICD-10-CM | POA: Diagnosis not present

## 2019-10-22 MED ORDER — APIXABAN 2.5 MG PO TABS: 2.5000 mg | ORAL_TABLET | Freq: Two times a day (BID) | ORAL | 5 refills | Status: DC

## 2019-10-22 MED ORDER — DOXAZOSIN MESYLATE 2 MG PO TABS: 2.0000 mg | ORAL_TABLET | Freq: Every day | ORAL | 11 refills | Status: AC

## 2019-10-22 NOTE — Addendum Note (Signed)
Addended by: Gayland Curry on: 10/22/2019 12:49 PM   Modules accepted: Orders

## 2019-10-23 DIAGNOSIS — R601 Generalized edema: Secondary | ICD-10-CM | POA: Diagnosis not present

## 2019-10-23 DIAGNOSIS — N39498 Other specified urinary incontinence: Secondary | ICD-10-CM | POA: Diagnosis not present

## 2019-10-23 DIAGNOSIS — M1711 Unilateral primary osteoarthritis, right knee: Secondary | ICD-10-CM | POA: Diagnosis not present

## 2019-10-23 DIAGNOSIS — Z85820 Personal history of malignant melanoma of skin: Secondary | ICD-10-CM | POA: Diagnosis not present

## 2019-10-23 DIAGNOSIS — L570 Actinic keratosis: Secondary | ICD-10-CM | POA: Diagnosis not present

## 2019-10-23 DIAGNOSIS — L821 Other seborrheic keratosis: Secondary | ICD-10-CM | POA: Diagnosis not present

## 2019-10-23 DIAGNOSIS — M6389 Disorders of muscle in diseases classified elsewhere, multiple sites: Secondary | ICD-10-CM | POA: Diagnosis not present

## 2019-10-23 DIAGNOSIS — R278 Other lack of coordination: Secondary | ICD-10-CM | POA: Diagnosis not present

## 2019-10-23 DIAGNOSIS — D485 Neoplasm of uncertain behavior of skin: Secondary | ICD-10-CM | POA: Diagnosis not present

## 2019-10-23 DIAGNOSIS — L820 Inflamed seborrheic keratosis: Secondary | ICD-10-CM | POA: Diagnosis not present

## 2019-10-23 DIAGNOSIS — M25361 Other instability, right knee: Secondary | ICD-10-CM | POA: Diagnosis not present

## 2019-10-25 DIAGNOSIS — R278 Other lack of coordination: Secondary | ICD-10-CM | POA: Diagnosis not present

## 2019-10-25 DIAGNOSIS — M25361 Other instability, right knee: Secondary | ICD-10-CM | POA: Diagnosis not present

## 2019-10-25 DIAGNOSIS — M6389 Disorders of muscle in diseases classified elsewhere, multiple sites: Secondary | ICD-10-CM | POA: Diagnosis not present

## 2019-10-25 DIAGNOSIS — N39498 Other specified urinary incontinence: Secondary | ICD-10-CM | POA: Diagnosis not present

## 2019-10-25 DIAGNOSIS — M1711 Unilateral primary osteoarthritis, right knee: Secondary | ICD-10-CM | POA: Diagnosis not present

## 2019-10-25 DIAGNOSIS — R601 Generalized edema: Secondary | ICD-10-CM | POA: Diagnosis not present

## 2019-10-28 DIAGNOSIS — M6389 Disorders of muscle in diseases classified elsewhere, multiple sites: Secondary | ICD-10-CM | POA: Diagnosis not present

## 2019-10-28 DIAGNOSIS — M25361 Other instability, right knee: Secondary | ICD-10-CM | POA: Diagnosis not present

## 2019-10-28 DIAGNOSIS — R601 Generalized edema: Secondary | ICD-10-CM | POA: Diagnosis not present

## 2019-10-28 DIAGNOSIS — N39498 Other specified urinary incontinence: Secondary | ICD-10-CM | POA: Diagnosis not present

## 2019-10-28 DIAGNOSIS — R278 Other lack of coordination: Secondary | ICD-10-CM | POA: Diagnosis not present

## 2019-10-28 DIAGNOSIS — M1711 Unilateral primary osteoarthritis, right knee: Secondary | ICD-10-CM | POA: Diagnosis not present

## 2019-10-29 DIAGNOSIS — M6389 Disorders of muscle in diseases classified elsewhere, multiple sites: Secondary | ICD-10-CM | POA: Diagnosis not present

## 2019-10-29 DIAGNOSIS — N39498 Other specified urinary incontinence: Secondary | ICD-10-CM | POA: Diagnosis not present

## 2019-10-29 DIAGNOSIS — M1711 Unilateral primary osteoarthritis, right knee: Secondary | ICD-10-CM | POA: Diagnosis not present

## 2019-10-29 DIAGNOSIS — M25361 Other instability, right knee: Secondary | ICD-10-CM | POA: Diagnosis not present

## 2019-10-29 DIAGNOSIS — R278 Other lack of coordination: Secondary | ICD-10-CM | POA: Diagnosis not present

## 2019-10-29 DIAGNOSIS — R601 Generalized edema: Secondary | ICD-10-CM | POA: Diagnosis not present

## 2019-10-30 DIAGNOSIS — M1711 Unilateral primary osteoarthritis, right knee: Secondary | ICD-10-CM | POA: Diagnosis not present

## 2019-10-30 DIAGNOSIS — M6389 Disorders of muscle in diseases classified elsewhere, multiple sites: Secondary | ICD-10-CM | POA: Diagnosis not present

## 2019-10-30 DIAGNOSIS — M25361 Other instability, right knee: Secondary | ICD-10-CM | POA: Diagnosis not present

## 2019-10-30 DIAGNOSIS — R278 Other lack of coordination: Secondary | ICD-10-CM | POA: Diagnosis not present

## 2019-10-30 DIAGNOSIS — N39498 Other specified urinary incontinence: Secondary | ICD-10-CM | POA: Diagnosis not present

## 2019-10-30 DIAGNOSIS — R601 Generalized edema: Secondary | ICD-10-CM | POA: Diagnosis not present

## 2019-10-31 DIAGNOSIS — M1711 Unilateral primary osteoarthritis, right knee: Secondary | ICD-10-CM | POA: Diagnosis not present

## 2019-10-31 DIAGNOSIS — M6389 Disorders of muscle in diseases classified elsewhere, multiple sites: Secondary | ICD-10-CM | POA: Diagnosis not present

## 2019-10-31 DIAGNOSIS — M25361 Other instability, right knee: Secondary | ICD-10-CM | POA: Diagnosis not present

## 2019-10-31 DIAGNOSIS — N39498 Other specified urinary incontinence: Secondary | ICD-10-CM | POA: Diagnosis not present

## 2019-10-31 DIAGNOSIS — R278 Other lack of coordination: Secondary | ICD-10-CM | POA: Diagnosis not present

## 2019-10-31 DIAGNOSIS — R601 Generalized edema: Secondary | ICD-10-CM | POA: Diagnosis not present

## 2019-11-01 DIAGNOSIS — R601 Generalized edema: Secondary | ICD-10-CM | POA: Diagnosis not present

## 2019-11-01 DIAGNOSIS — N39498 Other specified urinary incontinence: Secondary | ICD-10-CM | POA: Diagnosis not present

## 2019-11-01 DIAGNOSIS — M6389 Disorders of muscle in diseases classified elsewhere, multiple sites: Secondary | ICD-10-CM | POA: Diagnosis not present

## 2019-11-01 DIAGNOSIS — M25361 Other instability, right knee: Secondary | ICD-10-CM | POA: Diagnosis not present

## 2019-11-01 DIAGNOSIS — R278 Other lack of coordination: Secondary | ICD-10-CM | POA: Diagnosis not present

## 2019-11-01 DIAGNOSIS — M1711 Unilateral primary osteoarthritis, right knee: Secondary | ICD-10-CM | POA: Diagnosis not present

## 2019-11-04 DIAGNOSIS — K5989 Other specified functional intestinal disorders: Secondary | ICD-10-CM | POA: Diagnosis not present

## 2019-11-04 DIAGNOSIS — I9589 Other hypotension: Secondary | ICD-10-CM | POA: Diagnosis not present

## 2019-11-04 DIAGNOSIS — M25361 Other instability, right knee: Secondary | ICD-10-CM | POA: Diagnosis not present

## 2019-11-04 DIAGNOSIS — M6389 Disorders of muscle in diseases classified elsewhere, multiple sites: Secondary | ICD-10-CM | POA: Diagnosis not present

## 2019-11-04 DIAGNOSIS — M1711 Unilateral primary osteoarthritis, right knee: Secondary | ICD-10-CM | POA: Diagnosis not present

## 2019-11-04 DIAGNOSIS — N39498 Other specified urinary incontinence: Secondary | ICD-10-CM | POA: Diagnosis not present

## 2019-11-04 DIAGNOSIS — I482 Chronic atrial fibrillation, unspecified: Secondary | ICD-10-CM | POA: Diagnosis not present

## 2019-11-04 DIAGNOSIS — R278 Other lack of coordination: Secondary | ICD-10-CM | POA: Diagnosis not present

## 2019-11-04 DIAGNOSIS — R601 Generalized edema: Secondary | ICD-10-CM | POA: Diagnosis not present

## 2019-11-05 DIAGNOSIS — M1711 Unilateral primary osteoarthritis, right knee: Secondary | ICD-10-CM | POA: Diagnosis not present

## 2019-11-05 DIAGNOSIS — R278 Other lack of coordination: Secondary | ICD-10-CM | POA: Diagnosis not present

## 2019-11-05 DIAGNOSIS — M25361 Other instability, right knee: Secondary | ICD-10-CM | POA: Diagnosis not present

## 2019-11-05 DIAGNOSIS — N39498 Other specified urinary incontinence: Secondary | ICD-10-CM | POA: Diagnosis not present

## 2019-11-05 DIAGNOSIS — R601 Generalized edema: Secondary | ICD-10-CM | POA: Diagnosis not present

## 2019-11-05 DIAGNOSIS — M6389 Disorders of muscle in diseases classified elsewhere, multiple sites: Secondary | ICD-10-CM | POA: Diagnosis not present

## 2019-11-06 DIAGNOSIS — M6389 Disorders of muscle in diseases classified elsewhere, multiple sites: Secondary | ICD-10-CM | POA: Diagnosis not present

## 2019-11-06 DIAGNOSIS — N39498 Other specified urinary incontinence: Secondary | ICD-10-CM | POA: Diagnosis not present

## 2019-11-06 DIAGNOSIS — R278 Other lack of coordination: Secondary | ICD-10-CM | POA: Diagnosis not present

## 2019-11-06 DIAGNOSIS — R601 Generalized edema: Secondary | ICD-10-CM | POA: Diagnosis not present

## 2019-11-06 DIAGNOSIS — M25361 Other instability, right knee: Secondary | ICD-10-CM | POA: Diagnosis not present

## 2019-11-06 DIAGNOSIS — M1711 Unilateral primary osteoarthritis, right knee: Secondary | ICD-10-CM | POA: Diagnosis not present

## 2019-11-07 DIAGNOSIS — M6389 Disorders of muscle in diseases classified elsewhere, multiple sites: Secondary | ICD-10-CM | POA: Diagnosis not present

## 2019-11-07 DIAGNOSIS — R601 Generalized edema: Secondary | ICD-10-CM | POA: Diagnosis not present

## 2019-11-07 DIAGNOSIS — M25361 Other instability, right knee: Secondary | ICD-10-CM | POA: Diagnosis not present

## 2019-11-07 DIAGNOSIS — R278 Other lack of coordination: Secondary | ICD-10-CM | POA: Diagnosis not present

## 2019-11-07 DIAGNOSIS — M1711 Unilateral primary osteoarthritis, right knee: Secondary | ICD-10-CM | POA: Diagnosis not present

## 2019-11-07 DIAGNOSIS — N39498 Other specified urinary incontinence: Secondary | ICD-10-CM | POA: Diagnosis not present

## 2019-11-14 DIAGNOSIS — R601 Generalized edema: Secondary | ICD-10-CM | POA: Diagnosis not present

## 2019-11-14 DIAGNOSIS — M1711 Unilateral primary osteoarthritis, right knee: Secondary | ICD-10-CM | POA: Diagnosis not present

## 2019-11-14 DIAGNOSIS — M25361 Other instability, right knee: Secondary | ICD-10-CM | POA: Diagnosis not present

## 2019-11-14 DIAGNOSIS — R278 Other lack of coordination: Secondary | ICD-10-CM | POA: Diagnosis not present

## 2019-11-14 DIAGNOSIS — M6389 Disorders of muscle in diseases classified elsewhere, multiple sites: Secondary | ICD-10-CM | POA: Diagnosis not present

## 2019-11-14 DIAGNOSIS — N39498 Other specified urinary incontinence: Secondary | ICD-10-CM | POA: Diagnosis not present

## 2019-11-20 DIAGNOSIS — L859 Epidermal thickening, unspecified: Secondary | ICD-10-CM | POA: Diagnosis not present

## 2019-11-20 DIAGNOSIS — L570 Actinic keratosis: Secondary | ICD-10-CM | POA: Diagnosis not present

## 2019-11-20 DIAGNOSIS — Z85820 Personal history of malignant melanoma of skin: Secondary | ICD-10-CM | POA: Diagnosis not present

## 2019-11-20 DIAGNOSIS — L821 Other seborrheic keratosis: Secondary | ICD-10-CM | POA: Diagnosis not present

## 2019-11-20 DIAGNOSIS — L0889 Other specified local infections of the skin and subcutaneous tissue: Secondary | ICD-10-CM | POA: Diagnosis not present

## 2019-11-20 DIAGNOSIS — D485 Neoplasm of uncertain behavior of skin: Secondary | ICD-10-CM | POA: Diagnosis not present

## 2019-12-11 ENCOUNTER — Non-Acute Institutional Stay: Payer: Medicare Other | Admitting: Internal Medicine

## 2019-12-11 ENCOUNTER — Other Ambulatory Visit: Payer: Self-pay

## 2019-12-11 ENCOUNTER — Encounter: Payer: Self-pay | Admitting: Internal Medicine

## 2019-12-11 VITALS — BP 138/58 | HR 54 | Temp 97.5°F | Ht 72.0 in | Wt 201.6 lb

## 2019-12-11 DIAGNOSIS — G4710 Hypersomnia, unspecified: Secondary | ICD-10-CM

## 2019-12-11 DIAGNOSIS — M170 Bilateral primary osteoarthritis of knee: Secondary | ICD-10-CM

## 2019-12-11 DIAGNOSIS — R001 Bradycardia, unspecified: Secondary | ICD-10-CM

## 2019-12-11 DIAGNOSIS — G4701 Insomnia due to medical condition: Secondary | ICD-10-CM

## 2019-12-11 DIAGNOSIS — I509 Heart failure, unspecified: Secondary | ICD-10-CM | POA: Diagnosis not present

## 2019-12-11 NOTE — Progress Notes (Signed)
Location:   [facility]   Place of Service:   clinic  Provider: Skiler Olden L. Mariea Clonts, D.O., C.M.D.  Code Status: DNR Goals of Care:  Advanced Directives 12/11/2019  Does Patient Have a Medical Advance Directive? Yes  Type of Advance Directive Out of facility DNR (pink MOST or yellow form)  Does patient want to make changes to medical advance directive? No - Patient declined  Copy of [place] in Chart? -  Pre-existing out of facility DNR order (yellow form or pink MOST form) Pink MOST/Yellow Form most recent copy in chart - Physician notified to receive inpatient order     Chief Complaint  Patient presents with  . Medical Management of Chronic Issues    4 month follow up     HPI: Patient is a 86 y.o. male seen today for medical management of chronic diseases.    He's feeling well.  Concerns are about the same.  He is pleased he was able to get stronger with therapy and return to his AL enhanced apt with modifications.  He hopes to stay there.  He reports ongoing difficulty with sleep.  He is now interested in trying a non-habit-forming sleep aid.  He has for several years been getting up in the middle of the night due to discomfort in his knee and now some in other joints, as well, lying awake, rubbing voltaren on it, getting up and walking around and moving to his recliner.  We have tried hs tylenol for him and he has not felt a benefit and refused to take it.  We discussed melatonin and he would like to try it.  He's had a couple of abrasions of his right leg that don't heal well.  Recommended use of moisturizer on legs and feet.  Needs toenails trimmed by podiatry so will ask AL to put him on the list.    He's overall content having meals with two other men, looking forward to writing group with [place] folks starting back and bridge.  His two children were able to visit in his apt over the weekend for Easter which he really enjoyed.  He continued to have trouble with  a small "bump" on his butt--had been an open area likely pressure injury from prolonged sitting, but it has healed--polymem being used over the spot to prevent recurrence.    He mentions his tremor being worse which he calls being nervous, but then denies actually being nervous/anxious.   Past Medical History:  Diagnosis Date  . Abnormality of gait 02/2010  . Anal and rectal polyp 1980  . Atrial fibrillation ([place]) 11/2009  . Chronic kidney disease, stage III (moderate) 08/07/2013  . Deviated nasal septum 11/2009  . Diaphragmatic hernia without mention of obstruction or gangrene 2007  . Diarrhea 02/2010  . Dizziness and giddiness 2009  . Edema 2010  . Essential and other specified forms of tremor 2007  . 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 Hyperglycemia 01/02/2013  . Hypertrophy of prostate without urinary obstruction and other lower urinary tract symptoms (LUTS) 2002  . Insomnia with sleep apnea, unspecified 02/2010  . Irritable bowel syndrome 11/14/2011  . Long term (current) use of anticoagulants 11/2009  . Other abnormal blood chemistry 02/09/2009  . Other B-complex deficiencies 2008  . Other dyspnea and respiratory abnormality 11/2009  . Other malaise and fatigue 2007  . Pain in joint, lower leg 02/2010  . Palpitations 2007  . Reflux esophagitis 2007  . Sebaceous  cyst 2008  . Tension headache 2009  . Undiagnosed cardiac murmurs 2002  . Unspecified constipation 07/04/2012  . Unspecified essential hypertension 2007  . Unspecified hereditary and idiopathic peripheral neuropathy 02/2010  . Unspecified transient cerebral ischemia 2002  . Urinary frequency 10/01/2012    History reviewed. No pertinent surgical history.  Allergies  Allergen Reactions  . Plavix [Clopidogrel Bisulfate]     indigestion    Outpatient Encounter Medications as of 12/11/2019  Medication Sig  . Acetaminophen 500 MG coapsule Take 2 capsules by mouth at bedtime as needed.  Marland Kitchen apixaban  (ELIQUIS) 2.5 MG TABS tablet Take 1 tablet (2.5 mg total) by mouth 2 (two) times daily.  Marland Kitchen doxazosin (CARDURA) 2 MG tablet Take 1 tablet (2 mg total) by mouth daily.  . Eyelid Cleansers (OCUSOFT EYELID CLEANSING) PADS Apply topically as needed.  . fluticasone (FLONASE ALLERGY RELIEF) 50 MCG/ACT nasal spray Place 2 sprays into both nostrils 2 (two) times daily.  . furosemide (LASIX) 40 MG tablet Take 40 mg by mouth daily. NO LASIX ON Saturday OR Sunday  . Lactase (LACTAID FAST ACT) 9000 units CHEW chew 1 tab (9,000 u) by mouth w/ first bite of milk product PRN lactose intolerant.  . Levothyroxine Sodium 50 MCG CAPS Take 1 capsule (50 mcg total) by mouth daily before breakfast.  . metoprolol succinate (TOPROL-XL) 25 MG 24 hr tablet Take 12.5 mg by mouth as needed (For palpitations).   . mineral oil-hydrophilic petrolatum (AQUAPHOR) ointment Apply 1 application topically 2 (two) times daily as needed for dry skin.  . mupirocin ointment (BACTROBAN) 2 % Apply 1 application topically 2 (two) times daily. Apply to left buttock skin tag PRN no more than 2 times a day until healed  . omeprazole (PRILOSEC OTC) 20 MG tablet Take 20 mg by mouth 2 (two) times daily. Before breakfast/dinner  . ondansetron (ZOFRAN) 4 MG tablet Take 4 mg by mouth every 6 (six) hours as needed for nausea or vomiting.  . Polyvinyl Alcohol-Povidone PF 1.4-0.6 % SOLN Apply 1.4 % to eye 2 (two) times daily.   . potassium chloride 20 MEQ/15ML (10%) SOLN Take 15 mLs by mouth daily. On Mon thru Fri, none on sat or sun  . trolamine salicylate (ASPERCREME) 10 % cream Apply 1 application topically 2 (two) times daily as needed.  . vitamin B-12 (CYANOCOBALAMIN) 1000 MCG tablet Take 1,000 mcg by mouth daily.   No facility-administered encounter medications on file as of 12/11/2019.    Review of Systems:  Review of Systems  Constitutional: Negative for chills and fever.  HENT: Positive for hearing loss. Negative for congestion and sore  throat.   Eyes: Negative for blurred vision.       Glasses  Respiratory: Negative for cough and shortness of breath.   Cardiovascular: Positive for leg swelling. Negative for chest pain and palpitations.       Less edema now  Gastrointestinal: Positive for constipation. Negative for abdominal pain, blood in stool and melena.       Fecal incontinence on occasion  Genitourinary: Positive for frequency. Negative for dysuria and urgency.  Musculoskeletal: Positive for joint pain. Negative for falls.    Health Maintenance  Topic Date Due  . INFLUENZA VACCINE  04/05/2020  . TETANUS/TDAP  02/24/2027  . PNA vac Low Risk Adult  Completed    Physical Exam: Vitals:   12/11/19 1052  BP: (!) 138/58  Pulse: (!) 54  Temp: (!) 97.5 F (36.4 C)  TempSrc: Temporal  SpO2:  94%  Weight: 201 lb 9.6 oz (91.4 kg)  Height: 6' (1.829 m)   Body mass index is 27.34 kg/m. Physical Exam Constitutional:      General: He is not in acute distress.    Appearance: Normal appearance. He is not toxic-appearing.  HENT:     Head: Normocephalic and atraumatic.  Cardiovascular:     Rate and Rhythm: Rhythm irregular.     Heart sounds: Murmur present.  Pulmonary:     Effort: Pulmonary effort is normal.     Breath sounds: Normal breath sounds. No rales.  Abdominal:     General: Bowel sounds are normal.     Palpations: Abdomen is soft.  Musculoskeletal:        General: Normal range of motion.     Right lower leg: Edema present.     Left lower leg: Edema present.  Skin:    General: Skin is warm and dry.  Neurological:     General: No focal deficit present.     Mental Status: He is alert and oriented to person, place, and time.     Gait: Gait abnormal.     Comments: Action tremor--rapid rate  Psychiatric:        Mood and Affect: Mood normal.        Behavior: Behavior normal.        Thought Content: Thought content normal.     Labs reviewed: Basic Metabolic Panel: Recent Labs    10/09/19 0000    NA 144  K 4.1  CL 106  CO2 24*  BUN 39*  CREATININE 1.6*  CALCIUM 8.7  TSH 2.59   Liver Function Tests: No results for input(s): AST, ALT, ALKPHOS, BILITOT, PROT, ALBUMIN in the last 8760 hours. No results for input(s): LIPASE, AMYLASE in the last 8760 hours. No results for input(s): AMMONIA in the last 8760 hours. CBC: No results for input(s): WBC, NEUTROABS, HGB, HCT, MCV, PLT in the last 8760 hours. Lipid Panel: No results for input(s): CHOL, HDL, LDLCALC, TRIG, CHOLHDL, LDLDIRECT in the last 8760 hours. Lab Results  Component Value Date   HGBA1C 5.3 03/01/2016    Assessment/Plan 1. Insomnia due to medical condition -due to arthritic pain but does not want stronger meds for it and fall risk -will try melatonin 3mg  30 mins before desired sleep time  2. Bilateral primary osteoarthritis of knee -persists continue voltaren, tylenol if desires  3. Chronic congestive heart failure, unspecified heart failure type ([place]) -stable with current lasix--edema is a little better  4. Bradycardia -persists, he's had it longstanding and not bothered by it at this level  5. Excessive sleepiness -due to his poor rest at night and advanced age  Labs/tests ordered:  No new Next appt:  Visit date not found  Mellony Danziger L. Julion Gatt, D.O. [facility] [address], [place] [address] Cell Phone (Mon-Fri 8am-5pm):  [phone] On Call:  [phone] & follow prompts after 5pm & weekends Office Phone:  [phone] Office Fax:  [phone]

## 2019-12-13 MED ORDER — MELATONIN 3 MG PO CAPS: 3.0000 mg | ORAL_CAPSULE | Freq: Every day | ORAL | 11 refills | Status: AC

## 2019-12-17 ENCOUNTER — Telehealth: Payer: Self-pay | Admitting: Family

## 2019-12-17 DIAGNOSIS — R509 Fever, unspecified: Secondary | ICD-10-CM | POA: Diagnosis not present

## 2019-12-17 DIAGNOSIS — D649 Anemia, unspecified: Secondary | ICD-10-CM | POA: Diagnosis not present

## 2019-12-17 LAB — COMPREHENSIVE METABOLIC PANEL
Calcium: 8.6 — AB (ref 8.7–10.7)
Calcium: 8.6 — AB (ref 8.7–10.7)

## 2019-12-17 LAB — CBC: RBC: 3.87 (ref 3.87–5.11)

## 2019-12-17 LAB — BASIC METABOLIC PANEL
BUN: 43 — AB (ref 4–21)
BUN: 43 — AB (ref 4–21)
CO2: 23 — AB (ref 13–22)
CO2: 23 — AB (ref 13–22)
Chloride: 102 (ref 99–108)
Chloride: 102 (ref 99–108)
Creatinine: 1.8 — AB (ref 0.6–1.3)
Creatinine: 1.8 — AB (ref 0.6–1.3)
Glucose: 139
Glucose: 14
Potassium: 4.1 (ref 3.4–5.3)
Potassium: 4.1 (ref 3.4–5.3)
Sodium: 142 (ref 137–147)
Sodium: 142 (ref 137–147)

## 2019-12-17 LAB — CBC AND DIFFERENTIAL
HCT: 38 — AB (ref 41–53)
Hemoglobin: 12.5 — AB (ref 13.5–17.5)
Platelets: 136 — AB (ref 150–399)
WBC: 9.8

## 2019-12-17 NOTE — Telephone Encounter (Signed)
Thank you.  I'm waiting on his labs.

## 2019-12-17 NOTE — Telephone Encounter (Signed)
Late Entry: 12/16/2019 Facility Nurse called stated patient was shivering Temp 99.0,HR 62,oxygen saturation 92 % RA and B/p 188/75.Tylenol given.reports no coughing or symptoms of UTI.Stat CBC/diff and BMP ordered.Advised to check Vital signs Q shift .

## 2019-12-25 ENCOUNTER — Encounter: Payer: Self-pay | Admitting: Internal Medicine

## 2019-12-31 ENCOUNTER — Encounter: Payer: Self-pay | Admitting: Internal Medicine

## 2020-01-14 DIAGNOSIS — M79672 Pain in left foot: Secondary | ICD-10-CM | POA: Diagnosis not present

## 2020-01-14 DIAGNOSIS — B351 Tinea unguium: Secondary | ICD-10-CM | POA: Diagnosis not present

## 2020-01-14 DIAGNOSIS — Q6689 Other  specified congenital deformities of feet: Secondary | ICD-10-CM | POA: Diagnosis not present

## 2020-01-14 DIAGNOSIS — M79671 Pain in right foot: Secondary | ICD-10-CM | POA: Diagnosis not present

## 2020-01-14 DIAGNOSIS — L89511 Pressure ulcer of right ankle, stage 1: Secondary | ICD-10-CM | POA: Diagnosis not present

## 2020-03-28 ENCOUNTER — Encounter: Payer: Self-pay | Admitting: Internal Medicine

## 2020-03-28 ENCOUNTER — Telehealth: Payer: Self-pay | Admitting: Internal Medicine

## 2020-03-28 DIAGNOSIS — R0602 Shortness of breath: Secondary | ICD-10-CM | POA: Diagnosis not present

## 2020-03-28 NOTE — Telephone Encounter (Signed)
Rollene Fare called to report to me that she had been assisting Gary [name] in the restroom when he became notably very short of breath.  He would have to take a breath every couple of words.  VS:  BP left arm 180/68 and right arm 184/62, HR 111 apically and irregular, pulse ox 97% RA, no edema at present.    Due to h/o afib and episodes similar, I recommended lopressor 25mg  once now.  We also did a CXR 2 view to r/o any pulmonary edema/chf.  His goals have long been comfort based.

## 2020-03-29 ENCOUNTER — Encounter: Payer: Self-pay | Admitting: Internal Medicine

## 2020-03-29 NOTE — Progress Notes (Signed)
When nurse called back later in evening, resident was resting peacefully.  HR had dropped to 37-40s and had been dizzy post metoprolol.  CXR returned overnight with small B/L pleural effusions (had episode of afib with RVR last night).  Resident wanted to rest so we opted to reassess him during the week as far as diuretics.

## 2020-03-30 ENCOUNTER — Non-Acute Institutional Stay: Payer: Medicare Other | Admitting: Adult Health

## 2020-03-30 ENCOUNTER — Encounter: Payer: Self-pay | Admitting: Adult Health

## 2020-03-30 DIAGNOSIS — I480 Paroxysmal atrial fibrillation: Secondary | ICD-10-CM | POA: Diagnosis not present

## 2020-03-30 DIAGNOSIS — I509 Heart failure, unspecified: Secondary | ICD-10-CM

## 2020-03-30 NOTE — Progress Notes (Signed)
Location:  Occupational psychologist of Service:  ALF (13) Provider:   Cindi Carbon, ANP [place] [phone]   Gary Curry, DO  Patient Care Team: Gary Curry, DO as PCP - General (Geriatric Medicine) [facility] [name], DDS as Consulting Physician (Oral Surgery)  Extended Emergency Contact Information Primary Emergency Contact: [name],Gary Address: [place], [place] [address] Gary [name] of [place] Phone: [phone] Mobile Phone: [phone] Relation: Son Secondary Emergency Contact: [name],Gary Address: [address]          [place], [place] [address] Gary [name] of [place] Mobile Phone: [phone] Relation: Daughter  Code Status:  DNR Goals of care: Advanced Directive information Advanced Directives 03/30/2020  Does Patient Have a Medical Advance Directive? Yes  Type of Paramedic of [place];Living will;Out of facility DNR (pink MOST or yellow form)  Does patient want to make changes to medical advance directive? No - Patient declined  Copy of [place] in Chart? Yes - validated most recent copy scanned in chart (See row information)  Pre-existing out of facility DNR order (yellow form or pink MOST form) Pink MOST form placed in chart (order not valid for inpatient use);Yellow form placed in chart (order not valid for inpatient use)     Chief Complaint  Patient presents with   Acute Visit    f/u sob     HPI:  Pt is a 86 y.o. male seen today for an acute visit for f/u regarding sob.   Gary [name] has a hx of CHF and afib. He has periods of low and high heart rate.  He has occasional episodes of shortness of breath during these times. On 7/24 Dr Mariea Clonts was notified that he was having elevated bp, Hr 111/irregular, and dyspnea on exertion. Lopressor 25 mg x 1 given. CXR ordered which showed mild congestive heart failure and  small bilateral pleural effusions. After the lopressor his HR was in the 37-40 range but DOE improved. No edema.   For my visit, Gary [name] does not feel sob and does not having trouble with breathing at night. No chest pain. HR 43-66 range irregular per nurse. His weight is stable at 200.8 lbs. He reports that he feels bloated through his abd and that his pants are tight. He feels this makes him short of breath. He has been cutting down on his intake but can't seem to lose weight. He states his bowels are moving well and he is not having any trouble with bladder emptying. Sats are WNL at 97%.     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   Hyperglycemia 01/02/2013   Hypertrophy of prostate without urinary obstruction and other lower urinary tract symptoms (LUTS) 2002   Insomnia with sleep apnea, unspecified 02/2010   Irritable bowel  syndrome 11/14/2011   Long term (current) use of anticoagulants 11/2009   Other abnormal blood chemistry 02/09/2009   Other B-complex deficiencies 2008   Other dyspnea and respiratory abnormality 11/2009   Other malaise and fatigue 2007   Pain in joint, lower leg 02/2010   Palpitations 2007   Reflux esophagitis 2007   Sebaceous cyst 2008   Tension headache 2009   Undiagnosed cardiac murmurs 2002   Unspecified constipation 07/04/2012    Unspecified essential hypertension 2007   Unspecified hereditary and idiopathic peripheral neuropathy 02/2010   Unspecified transient cerebral ischemia 2002   Urinary frequency 10/01/2012   History reviewed. No pertinent surgical history.  Allergies  Allergen Reactions   Plavix [Clopidogrel Bisulfate]     indigestion    Outpatient Encounter Medications as of 03/30/2020  Medication Sig   Acetaminophen 500 MG coapsule Take 2 capsules by mouth at bedtime as needed.   apixaban (ELIQUIS) 2.5 MG TABS tablet Take 1 tablet (2.5 mg total) by mouth 2 (two) times daily.   doxazosin (CARDURA) 2 MG tablet Take 1 tablet (2 mg total) by mouth daily.   Eyelid Cleansers (OCUSOFT EYELID CLEANSING) PADS Apply topically as needed.   fluticasone (FLONASE ALLERGY RELIEF) 50 MCG/ACT nasal spray Place 2 sprays into both nostrils 2 (two) times daily.   furosemide (LASIX) 40 MG tablet Take 40 mg by mouth daily.    Lactase (LACTAID FAST ACT) 9000 units CHEW chew 1 tab (9,000 u) by mouth w/ first bite of milk product PRN lactose intolerant.   Levothyroxine Sodium 50 MCG CAPS Take 1 capsule (50 mcg total) by mouth daily before breakfast.   Melatonin 3 MG CAPS Take 1 capsule (3 mg total) by mouth at bedtime.   metoprolol succinate (TOPROL-XL) 25 MG 24 hr tablet Take 12.5 mg by mouth as needed (For palpitations).    mineral oil-hydrophilic petrolatum (AQUAPHOR) ointment Apply 1 application topically 2 (two) times daily as needed for dry skin.   mupirocin ointment (BACTROBAN) 2 % Apply 1 application topically 2 (two) times daily. Apply to left buttock skin tag PRN no more than 2 times a day until healed   omeprazole (PRILOSEC OTC) 20 MG tablet Take 20 mg by mouth 2 (two) times daily. Before breakfast/dinner   ondansetron (ZOFRAN) 4 MG tablet Take 4 mg by mouth every 6 (six) hours as needed for nausea or vomiting.   Polyvinyl Alcohol-Povidone PF 1.4-0.6 % SOLN Apply 1.4 % to eye 2 (two) times daily.     potassium chloride 20 MEQ/15ML (10%) SOLN Take 15 mLs by mouth daily.    trolamine salicylate (ASPERCREME) 10 % cream Apply 1 application topically 2 (two) times daily as needed.   vitamin B-12 (CYANOCOBALAMIN) 1000 MCG tablet Take 1,000 mcg by mouth daily.   No facility-administered encounter medications on file as of 03/30/2020.    Review of Systems  Constitutional: Negative for activity change, appetite change, chills, diaphoresis, fatigue, fever and unexpected weight change.  Respiratory: Positive for shortness of breath (resolving but intermittently still an issue ). Negative for cough, wheezing and stridor.   Cardiovascular: Negative for chest pain, palpitations and leg swelling.  Gastrointestinal: Positive for abdominal distention. Negative for abdominal pain, anal bleeding, blood in stool, constipation, diarrhea, nausea and rectal pain.  Genitourinary: Negative for decreased urine volume, difficulty urinating and frequency.  Musculoskeletal: Positive for gait problem.  Psychiatric/Behavioral: Negative for behavioral problems and confusion.    Immunization History  Administered Date(s) Administered   Influenza Inj Mdck Quad Pf  06/23/2016   Influenza, High Dose Seasonal PF 07/04/2019   Influenza,inj,Quad PF,6+ Mos 07/05/2018   Influenza-Unspecified 06/11/2013, 06/23/2014, 06/25/2015, 06/29/2017   Pneumococcal Conjugate-13 09/09/2015   Pneumococcal Polysaccharide-23 09/05/2008   Td 09/05/1978   Tdap 02/23/2017   Zoster Recombinat (Shingrix) 10/02/2017, 03/01/2018   Pertinent  Health Maintenance Due  Topic Date Due   INFLUENZA VACCINE  04/05/2020   PNA vac Low Risk Adult  Completed   Fall Risk  12/11/2019 07/31/2019 04/05/2019 04/03/2019 11/21/2018  Falls in the past year? 0 0 0 0 0  Comment - Emmi Telephone Survey: data to providers prior to load - - -  Number falls in past yr: 0 - 0 0 0  Injury with Fall? 0 - 0 0 0  Risk for fall due to : - - Impaired balance/gait  - -  Follow up - - Falls evaluation completed;Education provided - -   Functional Status Survey:    Vitals:   03/30/20 1253  BP: (!) 142/60  Pulse: (!) 43  Resp: 16  Temp: 98.1 F (36.7 C)  Weight: 200 lb 12.8 oz (91.1 kg)   Body mass index is 27.23 kg/m. Physical Exam Vitals and nursing note reviewed.  Constitutional:      General: He is not in acute distress.    Appearance: He is not diaphoretic.  HENT:     Head: Normocephalic and atraumatic.  Neck:     Thyroid: No thyromegaly.     Vascular: No JVD.     Trachea: No tracheal deviation.  Cardiovascular:     Rate and Rhythm: Bradycardia present. Rhythm irregular.     Heart sounds: Murmur heard.   Pulmonary:     Effort: Pulmonary effort is normal. No respiratory distress.     Breath sounds: No wheezing.     Comments: Decreased bases Abdominal:     General: Bowel sounds are normal. There is distension (slightly on the right side. Not able to palpate the liver ).     Palpations: Abdomen is soft. There is no mass.     Tenderness: There is no abdominal tenderness. There is no right CVA tenderness or left CVA tenderness.  Musculoskeletal:     Comments: Trace edema to both ankles  Lymphadenopathy:     Cervical: No cervical adenopathy.  Skin:    General: Skin is warm and dry.  Neurological:     Mental Status: He is alert and oriented to person, place, and time.     Cranial Nerves: No cranial nerve deficit.     Labs reviewed: Recent Labs    10/09/19 0000 12/17/19 0000 12/17/19 0700  NA 144 142 142  K 4.1 4.1 4.1  CL 106 102 102  CO2 24* 23* 23*  BUN 39* 43* 43*  CREATININE 1.6* 1.8* 1.8*  CALCIUM 8.7  --  8.6*   8.6*   No results for input(s): AST, ALT, ALKPHOS, BILITOT, PROT, ALBUMIN in the last 8760 hours. Recent Labs    12/17/19 0000  WBC 9.8  HGB 12.5*  HCT 38*  PLT 136*   Lab Results  Component Value Date   TSH 2.59 10/09/2019   Lab Results  Component Value Date   HGBA1C 5.3 03/01/2016    Lab Results  Component Value Date   CHOL 119 03/01/2016   HDL 56 03/01/2016   LDLCALC 52 03/01/2016   TRIG 54 03/01/2016    Significant Diagnostic Results in last 30 days:  No results found.  Assessment/Plan 1. Acute congestive heart  failure, unspecified heart failure type (HCC) Mild, will given increase lasix dosing 60 mg qd x 2 days then return to 40 mg qd Monitor weight and symptoms for improvement (pants tight, feels bloated)  2. 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   Family/ staff Communication: discussed with Mr. Jakubiak  Labs/tests ordered:  NA

## 2020-04-15 ENCOUNTER — Non-Acute Institutional Stay: Payer: Medicare Other | Admitting: Internal Medicine

## 2020-04-15 ENCOUNTER — Encounter: Payer: Self-pay | Admitting: Internal Medicine

## 2020-04-15 ENCOUNTER — Other Ambulatory Visit: Payer: Self-pay

## 2020-04-15 VITALS — BP 141/82 | HR 85 | Temp 97.2°F | Ht 72.0 in | Wt 199.4 lb

## 2020-04-15 DIAGNOSIS — R531 Weakness: Secondary | ICD-10-CM

## 2020-04-15 DIAGNOSIS — G3184 Mild cognitive impairment, so stated: Secondary | ICD-10-CM

## 2020-04-15 DIAGNOSIS — I495 Sick sinus syndrome: Secondary | ICD-10-CM | POA: Diagnosis not present

## 2020-04-15 DIAGNOSIS — M170 Bilateral primary osteoarthritis of knee: Secondary | ICD-10-CM

## 2020-04-15 DIAGNOSIS — I509 Heart failure, unspecified: Secondary | ICD-10-CM

## 2020-04-15 DIAGNOSIS — I480 Paroxysmal atrial fibrillation: Secondary | ICD-10-CM

## 2020-04-18 NOTE — Progress Notes (Signed)
Location:  Occupational psychologist of Service:  Clinic (12)  Provider: Bolden Hagerman L. Mariea Clonts, D.O., C.M.D.  Code Status: DNR Goals of Care:  Advanced Directives 04/15/2020  Does Patient Have a Medical Advance Directive? Yes  Type of Advance Directive Out of facility DNR (pink MOST or yellow form);Healthcare Power of Attorney  Does patient want to make changes to medical advance directive? No - Patient declined  Copy of [place] in Chart? Yes - validated most recent copy scanned in chart (See row information)  Pre-existing out of facility DNR order (yellow form or pink MOST form) Pink MOST/Yellow Form most recent copy in chart - Physician notified to receive inpatient order     Chief Complaint  Patient presents with  . Medical Management of Chronic Issues    4 month follow up     HPI: Patient is a 86 y.o. male seen today for medical management of chronic diseases.  [facility] in [facility].  He is here with his son, Laurey Arrow.    He had his hearing aids adjusted yesterday Laurey Arrow fills in that a new receiver was put in one).  This has been helpful.  He is having some challenges with gradual decrease in the ability to pull himself up with the pole next to his recliner chair.  He explains that it depends on his exact positioning and is due to the right knee wanting to give way.  He does wear a support brace and the knee has longstanding OA.  He had an episode of significant sob back on 7/24.  He was hypertensive tachy, irregular.  sats normal, no edema.  He'd been sob on the phone with his son, also.  He notes that at times he will note the dyspnea when he's exerting himself more.  That time, we did a cxr to r/o pulmonary edema and I had given him a single dose of lopressor 25mg .  His HR later dropped considerably to 37-40range, and he was weak and fatigued and we decided that 12.5mg  prn for this in the future was adequate.  He normally runs in the 40s  to 60s long-term and has not wanted intervention or further eval.  Weight remained stable and sats normal.  CXR did show mild chf and bilateral pleural effusions.    NP saw him in f/u 7/26 and added the prn metoprolol back (had been stopped due to non-use) and a 2 day course of 60mg  lasix instead of 40mg  daily.    He has long felt bloated and his pants tight though weight stable and intake down for him.  Reports decreased appetite w/o weight loss today.  He continues to sleep a lot.  He fell asleep while on the computer this morning and drooled on himself.    With his current lasix regimen, elevation of feet at rest and compression socks, his ankles have stayed slim.    In terms of cognition, he forgot his writing class this week.  He continues to attend it as well as bridge regularly.    He's not seeing as well.  He has an eye appt 9/9 with [facility] ophthalmology.    Past Medical History:  Diagnosis Date  . Abnormality of gait 02/2010  . Anal and rectal polyp 1980  . Atrial fibrillation ([place]) 11/2009  . Chronic kidney disease, stage III (moderate) 08/07/2013  . Deviated nasal septum 11/2009  . Diaphragmatic hernia without mention of obstruction or gangrene 2007  .  Diarrhea 02/2010  . Dizziness and giddiness 2009  . Edema 2010  . Essential and other specified forms of tremor 2007  . 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 Hyperglycemia 01/02/2013  . Hypertrophy of prostate without urinary obstruction and other lower urinary tract symptoms (LUTS) 2002  . Insomnia with sleep apnea, unspecified 02/2010  . Irritable bowel syndrome 11/14/2011  . Long term (current) use of anticoagulants 11/2009  . Other abnormal blood chemistry 02/09/2009  . Other B-complex deficiencies 2008  . Other dyspnea and respiratory abnormality 11/2009  . Other malaise and fatigue 2007  . Pain in joint, lower leg 02/2010  . Palpitations 2007  . Reflux esophagitis 2007  . Sebaceous cyst 2008  . Tension  headache 2009  . Undiagnosed cardiac murmurs 2002  . Unspecified constipation 07/04/2012  . Unspecified essential hypertension 2007  . Unspecified hereditary and idiopathic peripheral neuropathy 02/2010  . Unspecified transient cerebral ischemia 2002  . Urinary frequency 10/01/2012    History reviewed. No pertinent surgical history.  Allergies  Allergen Reactions  . Plavix [Clopidogrel Bisulfate]     indigestion    Outpatient Encounter Medications as of 04/15/2020  Medication Sig  . Acetaminophen 500 MG coapsule Take 2 capsules by mouth at bedtime as needed.  Marland Kitchen apixaban (ELIQUIS) 2.5 MG TABS tablet Take 1 tablet (2.5 mg total) by mouth 2 (two) times daily.  Marland Kitchen doxazosin (CARDURA) 2 MG tablet Take 1 tablet (2 mg total) by mouth daily.  . Emollient (CERAVE DAILY MOISTURIZING) LOTN Apply topically. Apply to legs/feet before compression socks  . Eyelid Cleansers (OCUSOFT EYELID CLEANSING) PADS Apply topically as needed.  . fluticasone (FLONASE ALLERGY RELIEF) 50 MCG/ACT nasal spray Place 2 sprays into both nostrils 2 (two) times daily.  . furosemide (LASIX) 40 MG tablet Take 40 mg by mouth daily.   . Lactase (LACTAID FAST ACT) 9000 units CHEW chew 1 tab (9,000 u) by mouth w/ first bite of milk product PRN lactose intolerant.  . Levothyroxine Sodium 50 MCG CAPS Take 1 capsule (50 mcg total) by mouth daily before breakfast.  . Melatonin 3 MG CAPS Take 1 capsule (3 mg total) by mouth at bedtime.  . mupirocin ointment (BACTROBAN) 2 % Apply 1 application topically 2 (two) times daily. Apply to left buttock skin tag PRN no more than 2 times a day until healed  . omeprazole (PRILOSEC OTC) 20 MG tablet Take 20 mg by mouth 2 (two) times daily. Before breakfast/dinner  . Polyvinyl Alcohol-Povidone PF 1.4-0.6 % SOLN Apply 1.4 % to eye 2 (two) times daily.   . potassium chloride 20 MEQ/15ML (10%) SOLN Take 15 mLs by mouth daily.   Marland Kitchen trolamine salicylate (ASPERCREME) 10 % cream Apply 1 application  topically 2 (two) times daily as needed.  . vitamin B-12 (CYANOCOBALAMIN) 1000 MCG tablet Take 1,000 mcg by mouth daily.  . [DISCONTINUED] metoprolol succinate (TOPROL-XL) 25 MG 24 hr tablet Take 12.5 mg by mouth as needed (For palpitations).   . [DISCONTINUED] mineral oil-hydrophilic petrolatum (AQUAPHOR) ointment Apply 1 application topically 2 (two) times daily as needed for dry skin.  . [DISCONTINUED] ondansetron (ZOFRAN) 4 MG tablet Take 4 mg by mouth every 6 (six) hours as needed for nausea or vomiting.   No facility-administered encounter medications on file as of 04/15/2020.    Review of Systems:  Review of Systems  Constitutional: Positive for malaise/fatigue. Negative for chills, fever and weight loss.  HENT: Positive for hearing loss. Negative for congestion and sore  throat.   Eyes: Positive for blurred vision.  Respiratory: Negative for cough and shortness of breath.   Cardiovascular: Negative for chest pain, palpitations, orthopnea, leg swelling and PND.  Gastrointestinal: Negative for abdominal pain, blood in stool, constipation, diarrhea and melena.       Bloating  Genitourinary: Positive for frequency. Negative for dysuria and urgency.  Musculoskeletal: Positive for joint pain. Negative for falls.  Skin: Negative for itching and rash.  Neurological: Positive for weakness. Negative for dizziness and loss of consciousness.  Endo/Heme/Allergies: Bruises/bleeds easily.  Psychiatric/Behavioral: Positive for memory loss. Negative for depression. The patient is not nervous/anxious and does not have insomnia.        Mild cognitive impairment    Health Maintenance  Topic Date Due  . INFLUENZA VACCINE  04/05/2020  . TETANUS/TDAP  02/24/2027  . COVID-19 Vaccine  Completed  . PNA vac Low Risk Adult  Completed    Physical Exam: Vitals:   04/15/20 1104  BP: (!) 141/82  Pulse: 85  Temp: (!) 97.2 F (36.2 C)  TempSrc: Temporal  SpO2: 96%  Weight: 199 lb 6.4 oz (90.4 kg)    Height: 6' (1.829 m)   Body mass index is 27.04 kg/m. Physical Exam Vitals reviewed.  Constitutional:      General: He is not in acute distress.    Appearance: He is not toxic-appearing.  HENT:     Head: Normocephalic and atraumatic.     Ears:     Comments: Hearing aids Eyes:     Comments: glasses  Cardiovascular:     Rate and Rhythm: Rhythm irregular.     Heart sounds: No murmur heard.   Pulmonary:     Effort: Pulmonary effort is normal.     Breath sounds: Normal breath sounds. No rales.  Abdominal:     General: Bowel sounds are normal.  Musculoskeletal:        General: Normal range of motion.     Cervical back: Neck supple.     Right lower leg: No edema.     Left lower leg: No edema.     Comments: Ankles are slim with compression socks on; wearing brace on right knee  Skin:    General: Skin is warm and dry.  Neurological:     General: No focal deficit present.     Mental Status: He is alert and oriented to person, place, and time.     Cranial Nerves: No cranial nerve deficit.     Motor: Weakness present.     Comments: Uses power scooter  Psychiatric:        Mood and Affect: Mood normal.        Behavior: Behavior normal.        Thought Content: Thought content normal.        Judgment: Judgment normal.     Labs reviewed: Basic Metabolic Panel: Recent Labs    10/09/19 0000 12/17/19 0000 12/17/19 0700  NA 144 142 142  K 4.1 4.1 4.1  CL 106 102 102  CO2 24* 23* 23*  BUN 39* 43* 43*  CREATININE 1.6* 1.8* 1.8*  CALCIUM 8.7  --  8.6*  8.6*  TSH 2.59  --   --    Liver Function Tests: No results for input(s): AST, ALT, ALKPHOS, BILITOT, PROT, ALBUMIN in the last 8760 hours. No results for input(s): LIPASE, AMYLASE in the last 8760 hours. No results for input(s): AMMONIA in the last 8760 hours. CBC: Recent Labs    12/17/19  0000  WBC 9.8  HGB 12.5*  HCT 38*  PLT 136*   Lipid Panel: No results for input(s): CHOL, HDL, LDLCALC, TRIG, CHOLHDL,  LDLDIRECT in the last 8760 hours. Lab Results  Component Value Date   HGBA1C 5.3 03/01/2016    Procedures since last visit: No results found.  Assessment/Plan 1. Sick sinus syndrome (HCC) -seems he has this underlying and contributes to his tachy/brady with his afib -he has declined further cardiac workup  2. Paroxysmal atrial fibrillation (HCC) -cont current mgt and keep prn metoprolol 12.5mg  for HR over 110 and symptomatic--reminded him about reporting dyspnea b/c Laurey Arrow pointed out that he seemed winded for a bit before the episode was reported to anyone -added it back to med list  3. Bilateral primary osteoarthritis of knee -cont brace for support, continues to manage in [facility] at this time -had a prior episode of rehab in snf for strengthening and apt was accommodated for him  4. Chronic congestive heart failure, unspecified heart failure type (HCC) -cont lasix 40mg  daily, compression socks, leg elevation  5. Generalized weakness -due to advancing age and increasing frailty -monitor -wants to stay in his apt as long as possible  6. Mild cognitive impairment -is in fact mild, manages really well  Labs/tests ordered:  No new today Next appt:  08/19/2020  Zareah Hunzeker L. Von Inscoe, D.O. [facility] [address], [place] [address] Cell Phone (Mon-Fri 8am-5pm):  [phone] On Call:  [phone] & follow prompts after 5pm & weekends Office Phone:  [phone] Office Fax:  [phone]

## 2020-04-19 DIAGNOSIS — G3184 Mild cognitive impairment, so stated: Secondary | ICD-10-CM

## 2020-04-19 DIAGNOSIS — I495 Sick sinus syndrome: Secondary | ICD-10-CM | POA: Insufficient documentation

## 2020-04-19 HISTORY — DX: Mild cognitive impairment of uncertain or unknown etiology: G31.84

## 2020-04-19 MED ORDER — METOPROLOL TARTRATE 25 MG PO TABS: 12.5000 mg | ORAL_TABLET | Freq: Every day | ORAL | 0 refills | Status: DC | PRN

## 2020-04-20 DIAGNOSIS — Z20828 Contact with and (suspected) exposure to other viral communicable diseases: Secondary | ICD-10-CM | POA: Diagnosis not present

## 2020-04-20 DIAGNOSIS — Z9189 Other specified personal risk factors, not elsewhere classified: Secondary | ICD-10-CM | POA: Diagnosis not present

## 2020-04-27 DIAGNOSIS — Z20828 Contact with and (suspected) exposure to other viral communicable diseases: Secondary | ICD-10-CM | POA: Diagnosis not present

## 2020-04-27 DIAGNOSIS — Z9189 Other specified personal risk factors, not elsewhere classified: Secondary | ICD-10-CM | POA: Diagnosis not present

## 2020-05-12 DIAGNOSIS — Z961 Presence of intraocular lens: Secondary | ICD-10-CM | POA: Diagnosis not present

## 2020-05-12 DIAGNOSIS — H5203 Hypermetropia, bilateral: Secondary | ICD-10-CM | POA: Diagnosis not present

## 2020-05-12 DIAGNOSIS — H353131 Nonexudative age-related macular degeneration, bilateral, early dry stage: Secondary | ICD-10-CM | POA: Diagnosis not present

## 2020-05-12 DIAGNOSIS — H524 Presbyopia: Secondary | ICD-10-CM | POA: Diagnosis not present

## 2020-06-09 ENCOUNTER — Non-Acute Institutional Stay: Payer: Medicare Other | Admitting: Internal Medicine

## 2020-06-09 ENCOUNTER — Encounter: Payer: Self-pay | Admitting: Internal Medicine

## 2020-06-09 DIAGNOSIS — I495 Sick sinus syndrome: Secondary | ICD-10-CM

## 2020-06-09 DIAGNOSIS — I509 Heart failure, unspecified: Secondary | ICD-10-CM

## 2020-06-09 DIAGNOSIS — I480 Paroxysmal atrial fibrillation: Secondary | ICD-10-CM

## 2020-06-09 NOTE — Progress Notes (Signed)
Location:  [facility] Room Number: 638 Place of Service:  ALF (13) Provider:  Thea Holshouser L. Mariea Clonts, D.O., C.M.D.  Gayland Curry, DO  Patient Care Team: Gayland Curry, DO as PCP - General (Geriatric Medicine) Community, [facility] Derrill Memo, Nicki Reaper, DDS as Consulting Physician (Oral Surgery)  Extended Emergency Contact Information Primary Emergency Contact: Micucci,Edmar Address: [address] [place], [place] [address] [name] of [place] Phone: [phone] Mobile Phone: [phone] Relation: Son Secondary Emergency Contact: Gwyn,[name] Address: [address]          [place], [place] [address] [name] of [place] Mobile Phone: [phone] Relation: Daughter  Code Status:  DNR Goals of care: Advanced Directive information Advanced Directives 06/09/2020  Does Patient Have a Medical Advance Directive? Yes  Type of Advance Directive Living will;Out of facility DNR (pink MOST or yellow form)  Does patient want to make changes to medical advance directive? No - Patient declined  Copy of [place] in Chart? Yes - validated most recent copy scanned in chart (See row information)  Pre-existing out of facility DNR order (yellow form or pink MOST form) Pink MOST/Yellow Form most recent copy in chart - Physician notified to receive inpatient order     Chief Complaint  Patient presents with  . Acute Visit    4 lb weight gain in one week, 1+ edema (not elevating legs as directed)    HPI:  Pt is a 86 y.o. male seen today for an acute visit for weight gain of 4 lbs in a week and 1+ edema when not elevating legs as directed.  Turned out NP had already ordered for his lasix to be doubled at 40mg  po bid for 3 days with a bmp recheck on 63/8 but duplicate SBARs had been done and it was not known that it had been addressed already.  Of note, his bp is running up from baseline with more fluid on him.  He did not  feel different.  Vitals reviewed and appeared that HR and oxygen had been transposed in record based on his normal bradycardia and absence of signs of hypoxia.  Past Medical History:  Diagnosis Date  . Abnormality of gait 02/2010  . Anal and rectal polyp 1980  . Atrial fibrillation ([place]) 11/2009  . Chronic kidney disease, stage III (moderate) ([place]) 08/07/2013  . Deviated nasal septum 11/2009  . Diaphragmatic hernia without mention of obstruction or gangrene 2007  . Diarrhea 02/2010  . Dizziness and giddiness 2009  . Edema 2010  . Essential and other specified forms of tremor 2007  . 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 Hyperglycemia 01/02/2013  . Hypertrophy of prostate without urinary obstruction and other lower urinary tract symptoms (LUTS) 2002  . Insomnia with sleep apnea, unspecified 02/2010  . Irritable bowel syndrome 11/14/2011  . Long term (current) use of anticoagulants 11/2009  . Other abnormal blood chemistry 02/09/2009  . Other B-complex deficiencies 2008  . Other dyspnea and respiratory abnormality 11/2009  . Other malaise and fatigue 2007  . Pain in joint, lower leg 02/2010  . Palpitations 2007  . Reflux esophagitis 2007  . Sebaceous cyst 2008  . Tension headache 2009  . Undiagnosed cardiac murmurs 2002  . Unspecified constipation 07/04/2012  . Unspecified essential hypertension 2007  . Unspecified hereditary and idiopathic peripheral neuropathy 02/2010  . Unspecified transient cerebral ischemia 2002  . Urinary frequency  10/01/2012   History reviewed. No pertinent surgical history.  Allergies  Allergen Reactions  . Plavix [Clopidogrel Bisulfate]     indigestion    Outpatient Encounter Medications as of 06/09/2020  Medication Sig  . Acetaminophen 500 MG coapsule Take 2 capsules by mouth at bedtime as needed.  Marland Kitchen apixaban (ELIQUIS) 2.5 MG TABS tablet Take 1 tablet (2.5 mg total) by mouth 2 (two) times daily.  Marland Kitchen doxazosin (CARDURA) 2 MG tablet Take 1  tablet (2 mg total) by mouth daily.  . Emollient (CERAVE DAILY MOISTURIZING) LOTN Apply topically. Apply to legs/feet before compression socks  . Eyelid Cleansers (OCUSOFT EYELID CLEANSING) PADS Apply topically as needed.  . fluticasone (FLONASE ALLERGY RELIEF) 50 MCG/ACT nasal spray Place 2 sprays into both nostrils 2 (two) times daily.  . furosemide (LASIX) 40 MG tablet Take 40 mg by mouth daily.   . Lactase (LACTAID FAST ACT) 9000 units CHEW chew 1 tab (9,000 u) by mouth w/ first bite of milk product PRN lactose intolerant.  . Levothyroxine Sodium 50 MCG CAPS Take 1 capsule (50 mcg total) by mouth daily before breakfast.  . Melatonin 3 MG CAPS Take 1 capsule (3 mg total) by mouth at bedtime.  . metoprolol tartrate (LOPRESSOR) 25 MG tablet Take 0.5 tablets (12.5 mg total) by mouth daily as needed (HR>/= 110bpm).  . mupirocin ointment (BACTROBAN) 2 % Apply 1 application topically 2 (two) times daily. Apply to left buttock skin tag PRN no more than 2 times a day until healed  . omeprazole (PRILOSEC OTC) 20 MG tablet Take 20 mg by mouth 2 (two) times daily. Before breakfast/dinner  . Polyvinyl Alcohol-Povidone PF 1.4-0.6 % SOLN Apply 1.4 % to eye 2 (two) times daily.   . potassium chloride 20 MEQ/15ML (10%) SOLN Take 15 mLs by mouth daily.   Marland Kitchen trolamine salicylate (ASPERCREME) 10 % cream Apply 1 application topically 2 (two) times daily as needed.  . vitamin B-12 (CYANOCOBALAMIN) 1000 MCG tablet Take 1,000 mcg by mouth daily.   No facility-administered encounter medications on file as of 06/09/2020.    Review of Systems  Constitutional: Positive for malaise/fatigue. Negative for chills and fever.       4 lb wt gain in 1 wk  HENT: Positive for hearing loss. Negative for congestion and sore throat.        Hearing aids  Eyes:       Glasses  Respiratory: Negative for cough and shortness of breath.   Cardiovascular: Positive for leg swelling. Negative for chest pain and palpitations.       Irreg   Gastrointestinal: Negative for abdominal pain.       Increased distention which usually occurs with retention of fluid  Genitourinary: Negative for dysuria.  Musculoskeletal: Positive for joint pain. Negative for falls.  Neurological: Negative for dizziness, loss of consciousness, weakness and headaches.  Psychiatric/Behavioral: Negative for depression. The patient is not nervous/anxious and does not have insomnia.        MCI    Immunization History  Administered Date(s) Administered  . Influenza Inj Mdck Quad Pf 06/23/2016  . Influenza, High Dose Seasonal PF 07/04/2019  . Influenza,inj,Quad PF,6+ Mos 07/05/2018  . Influenza-Unspecified 06/11/2013, 06/23/2014, 06/25/2015, 06/29/2017  . Moderna SARS-COVID-2 Vaccination 09/17/2019, 10/30/2019  . Pneumococcal Conjugate-13 09/09/2015  . Pneumococcal Polysaccharide-23 09/05/2008  . Td 09/05/1978  . Tdap 02/23/2017  . Zoster Recombinat (Shingrix) 10/02/2017, 03/01/2018   Pertinent  Health Maintenance Due  Topic Date Due  . INFLUENZA VACCINE  04/05/2020  .  PNA vac Low Risk Adult  Completed   Fall Risk  04/19/2020 04/15/2020 04/15/2020 12/11/2019 07/31/2019  Falls in the past year? - 0 0 0 0  Comment - - - - [facility]i Telephone Survey: data to providers prior to load  Number falls in past yr: - 0 0 0 -  Injury with Fall? - 0 0 0 -  Risk for fall due to : History of fall(s);Impaired balance/gait;Impaired mobility - - - -  Follow up Falls prevention discussed;Education provided;Falls evaluation completed - - - -   Functional Status Survey:    Vitals:   06/09/20 1112  BP: (!) 172/84  Pulse: 98  Temp: (!) 97.1 F (36.2 C)  SpO2: (!) 55%  Weight: 207 lb 9.6 oz (94.2 kg)  Height: 5\' 9"  (1.753 m)   Body mass index is 30.66 kg/m. Physical Exam Vitals reviewed.  Constitutional:      General: He is not in acute distress.    Appearance: Normal appearance. He is not toxic-appearing.  Cardiovascular:     Rate and Rhythm: Bradycardia  present. Rhythm irregular.     Heart sounds: No murmur heard.   Pulmonary:     Effort: Pulmonary effort is normal.     Breath sounds: Normal breath sounds. No rales.  Abdominal:     General: Bowel sounds are normal. There is distension.     Palpations: Abdomen is soft.     Tenderness: There is no guarding or rebound.  Musculoskeletal:        General: Normal range of motion.     Right lower leg: Edema present.     Left lower leg: Edema present.     Comments: 1+  Neurological:     Mental Status: He is alert. Mental status is at baseline.     Comments: Uses walker to ambulate in apt and scooter longer distances     Labs reviewed: Recent Labs    10/09/19 0000 12/17/19 0000 12/17/19 0700  NA 144 142 142  K 4.1 4.1 4.1  CL 106 102 102  CO2 24* 23* 23*  BUN 39* 43* 43*  CREATININE 1.6* 1.8* 1.8*  CALCIUM 8.7  --  8.6*  8.6*   No results for input(s): AST, ALT, ALKPHOS, BILITOT, PROT, ALBUMIN in the last 8760 hours. Recent Labs    12/17/19 0000  WBC 9.8  HGB 12.5*  HCT 38*  PLT 136*   Lab Results  Component Value Date   TSH 2.59 10/09/2019   Lab Results  Component Value Date   HGBA1C 5.3 03/01/2016   Lab Results  Component Value Date   CHOL 119 03/01/2016   HDL 56 03/01/2016   LDLCALC 52 03/01/2016   TRIG 54 03/01/2016     Assessment/Plan 1. Acute congestive heart failure, unspecified heart failure type ([facility]) -increased lasix to 40mg  po bid x 3 days (started this today), bmp 10/7  2. Paroxysmal atrial fibrillation (HCC) -irregular, has some tachy-brady, but had not had any spells of tachy notable  3. Sick sinus syndrome [facility]) -has declined full cardiac workup at advanced age  Family/ staff Communication: d/w AL nurse  Labs/tests ordered:  BMP 10/7  Fran Mcree L. Lance [place]ar[place], D.O. [facility] [address], [place] [address] Cell Phone (Mon-Fri 8am-5pm):  [phone] On Call:  [phone] & follow  prompts after 5pm & weekends Office Phone:  [phone] Office Fax:  [phone]

## 2020-06-11 DIAGNOSIS — E210 Primary hyperparathyroidism: Secondary | ICD-10-CM | POA: Diagnosis not present

## 2020-06-11 DIAGNOSIS — I5020 Unspecified systolic (congestive) heart failure: Secondary | ICD-10-CM | POA: Diagnosis not present

## 2020-06-11 DIAGNOSIS — B370 Candidal stomatitis: Secondary | ICD-10-CM | POA: Diagnosis not present

## 2020-06-11 DIAGNOSIS — I509 Heart failure, unspecified: Secondary | ICD-10-CM | POA: Diagnosis not present

## 2020-06-11 LAB — BASIC METABOLIC PANEL
BUN: 40 — AB (ref 4–21)
CO2: 30 — AB (ref 13–22)
Chloride: 105 (ref 99–108)
Creatinine: 1.8 — AB (ref 0.6–1.3)
Glucose: 101
Potassium: 4.4 (ref 3.4–5.3)
Sodium: 144 (ref 137–147)

## 2020-06-11 LAB — COMPREHENSIVE METABOLIC PANEL: Calcium: 9 (ref 8.7–10.7)

## 2020-06-12 ENCOUNTER — Encounter: Payer: Self-pay | Admitting: Internal Medicine

## 2020-06-30 ENCOUNTER — Encounter: Payer: Self-pay | Admitting: Internal Medicine

## 2020-08-04 ENCOUNTER — Encounter: Payer: Self-pay | Admitting: Internal Medicine

## 2020-08-04 NOTE — Progress Notes (Signed)
This encounter was created in error - please disregard.

## 2020-08-07 ENCOUNTER — Non-Acute Institutional Stay: Payer: Medicare Other | Admitting: Adult Health

## 2020-08-07 ENCOUNTER — Encounter: Payer: Self-pay | Admitting: Adult Health

## 2020-08-07 DIAGNOSIS — M25511 Pain in right shoulder: Secondary | ICD-10-CM

## 2020-08-07 DIAGNOSIS — M12811 Other specific arthropathies, not elsewhere classified, right shoulder: Secondary | ICD-10-CM | POA: Diagnosis not present

## 2020-08-07 NOTE — Progress Notes (Signed)
Location:  [facility] of Service:  ALF (13) Provider:   Cindi Carbon, ANP [place] [phone]   Gayland Curry, DO  Patient Care Team: Gayland Curry, DO as PCP - General (Geriatric Medicine) Community, [facility] Derrill Memo, Nicki Reaper, DMD as Consulting Physician (Oral Surgery)  Extended Emergency Contact Information Primary Emergency Contact: Lampley,Vic Address: [place], [place] [address] [name] of [place] Phone: [phone] Mobile Phone: [phone] Relation: Son Secondary Emergency Contact: Koppel,[name] Address: [address]          [place], [place] [address] [name] of [place] Phone: [phone] Relation: Daughter  Code Status:  DNR Goals of care: Advanced Directive information Advanced Directives 08/04/2020  Does Patient Have a Medical Advance Directive? Yes  Type of Advance Directive Living will;Out of facility DNR (pink MOST or yellow form)  Does patient want to make changes to medical advance directive? No - Patient declined  Copy of [place] in Chart? Yes - validated most recent copy scanned in chart (See row information)  Pre-existing out of facility DNR order (yellow form or pink MOST form) Pink MOST/Yellow Form most recent copy in chart - Physician notified to receive inpatient order     Chief Complaint  Patient presents with  . Acute Visit    right shoulder pain     HPI:  Pt is a 86 y.o. male seen today for an acute visit for right shoulder pain. He states the pain is 6/10 and worsens with movement (raising the arm to the side or the front) which has been present for 3-4 weeks and worsening the past few days.  The pain is in the anterior shoulder area and can radiate to the elbow area but not always.  He is having difficulty performing ADLs. He states it is affecting his quality of life and sleep. He is not having numbness or  tingling. He is not having neck pain. Reports he had a bone spur in the right shoulder with pain 14 years ago and thinks its worse. He denies any acute injury. He is not ambulatory but can stand and pivot and uses both arms to transfer with a pole in AL.    Past Medical History:  Diagnosis Date  . Abnormality of gait 02/2010  . Anal and rectal polyp 1980  . Atrial fibrillation ([place]) 11/2009  . Chronic kidney disease, stage III (moderate) ([place]) 08/07/2013  . Deviated nasal septum 11/2009  . Diaphragmatic hernia without mention of obstruction or gangrene 2007  . Diarrhea 02/2010  . Dizziness and giddiness 2009  . Edema 2010  . Essential and other specified forms of tremor 2007  . 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 Hyperglycemia 01/02/2013  . Hypertrophy of prostate without urinary obstruction and other lower urinary tract symptoms (LUTS) 2002  . Insomnia with sleep apnea, unspecified 02/2010  . Irritable bowel syndrome 11/14/2011  . Long term (current) use of anticoagulants 11/2009  . Other abnormal blood chemistry 02/09/2009  . Other B-complex deficiencies 2008  . Other dyspnea and respiratory abnormality 11/2009  . Other malaise and fatigue 2007  . Pain in joint, lower leg 02/2010  . Palpitations 2007  . Reflux esophagitis 2007  . Sebaceous cyst 2008  . Tension headache 2009  . Undiagnosed cardiac murmurs 2002  . Unspecified constipation 07/04/2012  . Unspecified essential hypertension 2007  . Unspecified  hereditary and idiopathic peripheral neuropathy 02/2010  . Unspecified transient cerebral ischemia 2002  . Urinary frequency 10/01/2012   History reviewed. No pertinent surgical history.  Allergies  Allergen Reactions  . Plavix [Clopidogrel Bisulfate]     indigestion    Outpatient Encounter Medications as of 08/07/2020  Medication Sig  . Acetaminophen 500 MG coapsule Take 2 capsules by mouth at bedtime as needed.  Marland Kitchen apixaban (ELIQUIS) 2.5 MG TABS tablet  Take 1 tablet (2.5 mg total) by mouth 2 (two) times daily.  Marland Kitchen doxazosin (CARDURA) 2 MG tablet Take 1 tablet (2 mg total) by mouth daily.  . Emollient (CERAVE DAILY MOISTURIZING) LOTN Apply topically. Apply to legs/feet before compression socks  . Eyelid Cleansers (OCUSOFT EYELID CLEANSING) PADS Apply topically as needed.  . fluticasone (FLONASE ALLERGY RELIEF) 50 MCG/ACT nasal spray Place 2 sprays into both nostrils 2 (two) times daily.  . furosemide (LASIX) 40 MG tablet Take 40 mg by mouth daily.   . Lactase (LACTAID FAST ACT) 9000 units CHEW chew 1 tab (9,000 u) by mouth w/ first bite of milk product PRN lactose intolerant.  . Levothyroxine Sodium 50 MCG CAPS Take 1 capsule (50 mcg total) by mouth daily before breakfast.  . Melatonin 3 MG CAPS Take 1 capsule (3 mg total) by mouth at bedtime.  . metoprolol tartrate (LOPRESSOR) 25 MG tablet Take 0.5 tablets (12.5 mg total) by mouth daily as needed (HR>/= 110bpm).  . mupirocin ointment (BACTROBAN) 2 % Apply 1 application topically 2 (two) times daily. Apply to left buttock skin tag PRN no more than 2 times a day until healed  . omeprazole (PRILOSEC OTC) 20 MG tablet Take 20 mg by mouth 2 (two) times daily. Before breakfast/dinner  . Polyvinyl Alcohol-Povidone PF 1.4-0.6 % SOLN Apply 1.4 % to eye 2 (two) times daily.   . potassium chloride 20 MEQ/15ML (10%) SOLN Take 15 mLs by mouth daily.   Marland Kitchen trolamine salicylate (ASPERCREME) 10 % cream Apply 1 application topically 2 (two) times daily as needed.  . vitamin B-12 (CYANOCOBALAMIN) 1000 MCG tablet Take 1,000 mcg by mouth daily.   No facility-administered encounter medications on file as of 08/07/2020.    Review of Systems  Constitutional: Positive for activity change. Negative for appetite change, chills, diaphoresis, fatigue, fever and unexpected weight change.  Respiratory: Negative for cough, shortness of breath and wheezing.   Musculoskeletal: Positive for arthralgias and gait problem. Negative  for joint swelling, myalgias, neck pain and neck stiffness.       Right shoulder pain with ROM    Immunization History  Administered Date(s) Administered  . Influenza Inj Mdck Quad Pf 06/23/2016  . Influenza, High Dose Seasonal PF 07/04/2019, 07/03/2020  . Influenza,inj,Quad PF,6+ Mos 07/05/2018  . Influenza-Unspecified 06/11/2013, 06/23/2014, 06/25/2015, 06/29/2017  . Moderna SARS-COVID-2 Vaccination 09/17/2019, 10/30/2019  . Pneumococcal Conjugate-13 09/09/2015  . Pneumococcal Polysaccharide-23 09/05/2008  . Td 09/05/1978  . Tdap 02/23/2017  . Zoster Recombinat (Shingrix) 10/02/2017, 03/01/2018   Pertinent  Health Maintenance Due  Topic Date Due  . INFLUENZA VACCINE  Completed  . PNA vac Low Risk Adult  Completed   Fall Risk  04/19/2020 04/15/2020 04/15/2020 12/11/2019 07/31/2019  Falls in the past year? - 0 0 0 0  Comment - - - - Emmi Telephone Survey: data to providers prior to load  Number falls in past yr: - 0 0 0 -  Injury with Fall? - 0 0 0 -  Risk for fall due to : History of fall(s);Impaired  balance/gait;Impaired mobility - - - -  Follow up Falls prevention discussed;Education provided;Falls evaluation completed - - - -   Functional Status Survey:    Vitals:   08/07/20 1411  BP: 138/72  Pulse: 62  Resp: (!) 22  Temp: 97.6 F (36.4 C)  SpO2: 98%   There is no height or weight on file to calculate BMI. Physical Exam Vitals and nursing note reviewed.  Constitutional:      Appearance: Normal appearance.  Musculoskeletal:        General: Tenderness (AC joint area. No brusiing. ) present. No swelling, deformity or signs of injury.     Comments: Pain with lateral or forward ROM. + radial pulse on the right. Positive sensation on the right.. Not able to perform empty can test due to pain   Skin:    General: Skin is warm and dry.  Neurological:     Mental Status: He is alert.     Labs reviewed: Recent Labs    10/09/19 0000 10/09/19 0000 12/17/19 0000  12/17/19 0700 06/11/20 0000  NA 144   < > 142 142 144  K 4.1   < > 4.1 4.1 4.4  CL 106   < > 102 102 105  CO2 24*   < > 23* 23* 30*  BUN 39*   < > 43* 43* 40*  CREATININE 1.6*   < > 1.8* 1.8* 1.8*  CALCIUM 8.7  --   --  8.6*  8.6* 9.0   < > = values in this interval not displayed.   No results for input(s): AST, ALT, ALKPHOS, BILITOT, PROT, ALBUMIN in the last 8760 hours. Recent Labs    12/17/19 0000  WBC 9.8  HGB 12.5*  HCT 38*  PLT 136*   Lab Results  Component Value Date   TSH 2.59 10/09/2019   Lab Results  Component Value Date   HGBA1C 5.3 03/01/2016   Lab Results  Component Value Date   CHOL 119 03/01/2016   HDL 56 03/01/2016   LDLCALC 52 03/01/2016   TRIG 54 03/01/2016    Significant Diagnostic Results in last 30 days:  No results found.  Assessment/Plan 1. Acute pain of right shoulder Very bothersome to him affecting ADLs and sleep Not able to take nsaids due to CKD Recommend prednisone 20 mg bid with food x 5 days, if no improvement may consider joint injection Recommend xray as pt states he had prior bone spur  2. Rotator cuff arthropathy, right As above Has +CMS No surgery he will be 89 on 12/6    Family/ staff Communication: discussed with Mr. Sahagun  Labs/tests ordered:  Xray right shoulder 2 view

## 2020-08-08 DIAGNOSIS — M25511 Pain in right shoulder: Secondary | ICD-10-CM | POA: Diagnosis not present

## 2020-08-19 ENCOUNTER — Non-Acute Institutional Stay: Payer: Medicare Other | Admitting: Internal Medicine

## 2020-08-19 ENCOUNTER — Encounter: Payer: Self-pay | Admitting: Internal Medicine

## 2020-08-19 ENCOUNTER — Other Ambulatory Visit: Payer: Self-pay

## 2020-08-19 VITALS — BP 116/68 | HR 46 | Temp 97.9°F

## 2020-08-19 DIAGNOSIS — M12811 Other specific arthropathies, not elsewhere classified, right shoulder: Secondary | ICD-10-CM | POA: Diagnosis not present

## 2020-08-19 DIAGNOSIS — I509 Heart failure, unspecified: Secondary | ICD-10-CM

## 2020-08-19 DIAGNOSIS — I495 Sick sinus syndrome: Secondary | ICD-10-CM | POA: Diagnosis not present

## 2020-08-19 DIAGNOSIS — I480 Paroxysmal atrial fibrillation: Secondary | ICD-10-CM | POA: Diagnosis not present

## 2020-08-19 DIAGNOSIS — M170 Bilateral primary osteoarthritis of knee: Secondary | ICD-10-CM | POA: Diagnosis not present

## 2020-08-19 DIAGNOSIS — E039 Hypothyroidism, unspecified: Secondary | ICD-10-CM | POA: Diagnosis not present

## 2020-08-19 NOTE — Progress Notes (Signed)
Location:  [facility] of Service:  Clinic (12)  Provider: Olie Scaffidi L. Mariea Clonts, D.O., C.M.D.  Code Status: DNR Goals of Care:  Advanced Directives 08/19/2020  Does Patient Have a Medical Advance Directive? Yes  Type of Advance Directive Living will;Out of facility DNR (pink MOST or yellow form)  Does patient want to make changes to medical advance directive? -  Copy of [place] in Chart? -  Pre-existing out of facility DNR order (yellow form or pink MOST form) Yellow form placed in chart (order not valid for inpatient use)   Chief Complaint  Patient presents with  . Medical Management of Chronic Issues    4 month follow-up. Patient c/o loose stools and questions if he is drinking too much coffee. Patient also c/o aches in knees and shoulders. Here with daughter, Juliann [name]     HPI: Patient is a 86 y.o. male seen today for medical management of chronic diseases.    Alyse Low gave him 5 prednisone for his shoulder and it was like a miracle.  7.5/10 pain.  Slept right through his normal time he awakens when he had that once.  Nurses woke him.  Reviewed xray:  AC joint arthritis.  He's having 3 bms per day.  He's begun drinking more coffee than in the past--enjoys it.  He just stopped coffee today.  He had hot water, milk and sugar instead.    His hurting knee--still uses aspercreme.    He really sleeps a lot.  Says almost all of the time.  He had a nap since breakfast.  Fell asleep at the computer.  Reading the paper, falls asleep.  Unless someone's there, he goes to sleep.  He admits to nasal congestion affecting his breathing.  Uses nasal spray.    He's been asked to participate in a study of folks over 89 with a professor in [place].    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  Still part of the writing group and bridge.    Past Medical History:  Diagnosis Date  . Abnormality of gait 02/2010  . Anal and rectal polyp 1980  . Atrial  fibrillation ([place]) 11/2009  . Chronic kidney disease, stage III (moderate) ([place]) 08/07/2013  . Deviated nasal septum 11/2009  . Diaphragmatic hernia without mention of obstruction or gangrene 2007  . Diarrhea 02/2010  . Dizziness and giddiness 2009  . Edema 2010  . Essential and other specified forms of tremor 2007  . 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 Hyperglycemia 01/02/2013  . Hypertrophy of prostate without urinary obstruction and other lower urinary tract symptoms (LUTS) 2002  . Insomnia with sleep apnea, unspecified 02/2010  . Irritable bowel syndrome 11/14/2011  . Long term (current) use of anticoagulants 11/2009  . Other abnormal blood chemistry 02/09/2009  . Other B-complex deficiencies 2008  . Other dyspnea and respiratory abnormality 11/2009  . Other malaise and fatigue 2007  . Pain in joint, lower leg 02/2010  . Palpitations 2007  . Reflux esophagitis 2007  . Sebaceous cyst 2008  . Tension headache 2009  . Undiagnosed cardiac murmurs 2002  . Unspecified constipation 07/04/2012  . Unspecified essential hypertension 2007  . Unspecified hereditary and idiopathic peripheral neuropathy 02/2010  . Unspecified transient cerebral ischemia 2002  . Urinary frequency 10/01/2012    History reviewed. No pertinent surgical history.  Allergies  Allergen Reactions  . Plavix [Clopidogrel Bisulfate]     indigestion  Outpatient Encounter Medications as of 08/19/2020  Medication Sig  . Acetaminophen 500 MG coapsule Take 2 capsules by mouth at bedtime. And as needed  . apixaban (ELIQUIS) 2.5 MG TABS tablet Take 1 tablet (2.5 mg total) by mouth 2 (two) times daily.  Marland Kitchen doxazosin (CARDURA) 2 MG tablet Take 1 tablet (2 mg total) by mouth daily.  . Emollient (CERAVE DAILY MOISTURIZING) LOTN Apply topically. Apply to legs/feet before compression socks  . Eyelid Cleansers (OCUSOFT EYELID CLEANSING) PADS Apply topically as needed.  . fluticasone (FLONASE) 50 MCG/ACT nasal  spray Place 2 sprays into both nostrils 2 (two) times daily.  . furosemide (LASIX) 40 MG tablet Take 40 mg by mouth daily.   . Lactase 9000 units CHEW chew 1 tab (9,000 u) by mouth w/ first bite of milk product PRN lactose intolerant.  . Levothyroxine Sodium 50 MCG CAPS Take 1 capsule (50 mcg total) by mouth daily before breakfast.  . Melatonin 3 MG CAPS Take 1 capsule (3 mg total) by mouth at bedtime.  . metoprolol tartrate (LOPRESSOR) 25 MG tablet Take 0.5 tablets (12.5 mg total) by mouth daily as needed (HR>/= 110bpm).  . mupirocin ointment (BACTROBAN) 2 % Apply 1 application topically 2 (two) times daily. Apply to left buttock skin tag PRN no more than 2 times a day until healed  . omeprazole (PRILOSEC OTC) 20 MG tablet Take 20 mg by mouth daily.  . Polyvinyl Alcohol-Povidone PF 1.4-0.6 % SOLN Apply 1.4 % to eye 2 (two) times daily.   . potassium chloride 20 MEQ/15ML (10%) SOLN Take 15 mLs by mouth daily.   Marland Kitchen trolamine salicylate (ASPERCREME) 10 % cream Apply 1 application topically 2 (two) times daily as needed.  . vitamin B-12 (CYANOCOBALAMIN) 1000 MCG tablet Take 1,000 mcg by mouth daily.   No facility-administered encounter medications on file as of 08/19/2020.    Review of Systems:  Review of Systems  Constitutional: Negative for chills and fever.  HENT: Positive for hearing loss. Negative for congestion and sore throat.        Hearing loss getting worse  Eyes: Negative for blurred vision.       Glasses  Respiratory: Negative for cough and shortness of breath.   Cardiovascular: Negative for chest pain, palpitations and leg swelling.  Gastrointestinal: Negative for abdominal pain, blood in stool, constipation and melena.       Frequent bms  Genitourinary: Negative for dysuria.  Musculoskeletal: Positive for joint pain. Negative for falls.  Neurological: Negative for dizziness and loss of consciousness.  Endo/Heme/Allergies: Bruises/bleeds easily.  Psychiatric/Behavioral:  Negative for depression and memory loss. The patient is not nervous/anxious and does not have insomnia.     Health Maintenance  Topic Date Due  . COVID-19 Vaccine (3 - Moderna risk 4-dose series) 11/27/2019  . TETANUS/TDAP  02/24/2027  . INFLUENZA VACCINE  Completed  . PNA vac Low Risk Adult  Completed    Physical Exam: Vitals:   08/19/20 1052  BP: 116/68  [name]: (!) 46  Temp: 97.9 F (36.6 C)  TempSrc: Temporal  SpO2: 96%   There is no height or weight on file to calculate BMI. Physical Exam Vitals reviewed.  Constitutional:      General: He is not in acute distress.    Appearance: Normal appearance. He is not toxic-appearing.  HENT:     Head: Normocephalic and atraumatic.     Ears:     Comments: Bilateral hearing aids but still HOH    Mouth/Throat:  Comments: Drools a little now  Eyes:     Conjunctiva/sclera: Conjunctivae normal.     Pupils: Pupils are equal, round, and reactive to light.     Comments: glasses  Cardiovascular:     Rate and Rhythm: Normal rate and regular rhythm.  Pulmonary:     Effort: Pulmonary effort is normal.     Breath sounds: Normal breath sounds. No rales.  Abdominal:     General: Bowel sounds are normal. There is no distension.     Palpations: Abdomen is soft.     Tenderness: There is no abdominal tenderness. There is no guarding or rebound.  Musculoskeletal:        General: Normal range of motion.     Cervical back: Neck supple.     Right lower leg: No edema.     Left lower leg: No edema.     Comments: Right shoulder tenderness resolved, right knee not tender either  Neurological:     General: No focal deficit present.     Mental Status: He is alert and oriented to person, place, and time.  Psychiatric:        Mood and Affect: Mood normal.        Behavior: Behavior normal.        Thought Content: Thought content normal.        Judgment: Judgment normal.     Labs reviewed: Basic Metabolic Panel: Recent Labs     10/09/19 0000 12/17/19 0000 12/17/19 0700 06/11/20 0000  NA 144 142 142 144  K 4.1 4.1 4.1 4.4  CL 106 102 102 105  CO2 24* 23* 23* 30*  BUN 39* 43* 43* 40*  CREATININE 1.6* 1.8* 1.8* 1.8*  CALCIUM 8.7  --  8.6*  8.6* 9.0  TSH 2.59  --   --   --    Liver Function Tests: No results for input(s): AST, ALT, ALKPHOS, BILITOT, PROT, ALBUMIN in the last 8760 hours. No results for input(s): LIPASE, AMYLASE in the last 8760 hours. No results for input(s): AMMONIA in the last 8760 hours. CBC: Recent Labs    12/17/19 0000  WBC 9.8  HGB 12.5*  HCT 38*  PLT 136*   Lipid Panel: No results for input(s): CHOL, HDL, LDLCALC, TRIG, CHOLHDL, LDLDIRECT in the last 8760 hours. Lab Results  Component Value Date   HGBA1C 5.3 03/01/2016    Procedures since last visit: No results found.  Assessment/Plan 1. Rotator cuff arthropathy, right -improved after course of prednisone -discussed could be repeated or shoulder injection if needed, but he no longer has any pain at present  2. Paroxysmal atrial fibrillation (HCC) -rate currently controlled, seems to have tachy-brady which he previously declined further eval for  3. Bilateral primary osteoarthritis of knee -cont aspercreme prn, tylenol  4. Chronic congestive heart failure, unspecified heart failure type (HCC) -cont lasix daily, edema finally better with this  5. Acquired hypothyroidism -cont current levothyroxine and monitor  6. Sick sinus syndrome ([place]) -has prn lopressor when tachy and symptomatic  Labs/tests ordered:  No new Next appt:  11/25/2020  Sharicka Pogorzelski L. Kaylin Schellenberg, D.O. [facility] [address], [place] [address] Cell Phone (Mon-Fri 8am-5pm):  [phone] On Call:  [phone] & follow prompts after 5pm & weekends Office Phone:  [phone] Office Fax:  [phone]

## 2020-08-25 ENCOUNTER — Telehealth: Payer: Self-pay

## 2020-08-25 NOTE — Telephone Encounter (Signed)
Called and left message trying to schedule AWV for patient with Sherrie Mustache, NP.

## 2020-09-13 DIAGNOSIS — Z9189 Other specified personal risk factors, not elsewhere classified: Secondary | ICD-10-CM | POA: Diagnosis not present

## 2020-09-13 DIAGNOSIS — R112 Nausea with vomiting, unspecified: Secondary | ICD-10-CM | POA: Diagnosis not present

## 2020-09-13 DIAGNOSIS — R6883 Chills (without fever): Secondary | ICD-10-CM | POA: Diagnosis not present

## 2020-09-13 DIAGNOSIS — Z20822 Contact with and (suspected) exposure to covid-19: Secondary | ICD-10-CM | POA: Diagnosis not present

## 2020-09-13 DIAGNOSIS — N401 Enlarged prostate with lower urinary tract symptoms: Secondary | ICD-10-CM | POA: Diagnosis not present

## 2020-09-13 LAB — CBC: RBC: 4.1 (ref 3.87–5.11)

## 2020-09-13 LAB — CBC AND DIFFERENTIAL
HCT: 39 — AB (ref 41–53)
Hemoglobin: 12.8 — AB (ref 13.5–17.5)
Neutrophils Absolute: 10.5
Platelets: 146 — AB (ref 150–399)
WBC: 12.1

## 2020-09-13 LAB — BASIC METABOLIC PANEL
BUN: 37 — AB (ref 4–21)
CO2: 26 — AB (ref 13–22)
Chloride: 104 (ref 99–108)
Creatinine: 1.7 — AB (ref <=1.3)
Potassium: 3.7 (ref 3.4–5.3)
Sodium: 141 (ref 137–147)

## 2020-09-13 LAB — NOVEL CORONAVIRUS, NAA: SARS-CoV-2, NAA: NOT DETECTED

## 2020-09-13 LAB — COMPREHENSIVE METABOLIC PANEL: Calcium: 8.5 — AB (ref 8.7–10.7)

## 2020-09-14 ENCOUNTER — Encounter: Payer: Self-pay | Admitting: Internal Medicine

## 2020-09-14 ENCOUNTER — Non-Acute Institutional Stay (SKILLED_NURSING_FACILITY): Payer: Medicare Other | Admitting: Adult Health

## 2020-09-14 DIAGNOSIS — N401 Enlarged prostate with lower urinary tract symptoms: Secondary | ICD-10-CM | POA: Diagnosis not present

## 2020-09-14 DIAGNOSIS — E860 Dehydration: Secondary | ICD-10-CM | POA: Diagnosis not present

## 2020-09-14 DIAGNOSIS — R3915 Urgency of urination: Secondary | ICD-10-CM | POA: Diagnosis not present

## 2020-09-14 DIAGNOSIS — N1832 Chronic kidney disease, stage 3b: Secondary | ICD-10-CM | POA: Diagnosis not present

## 2020-09-14 DIAGNOSIS — R531 Weakness: Secondary | ICD-10-CM | POA: Diagnosis not present

## 2020-09-14 DIAGNOSIS — R110 Nausea: Secondary | ICD-10-CM

## 2020-09-14 DIAGNOSIS — R509 Fever, unspecified: Secondary | ICD-10-CM | POA: Diagnosis not present

## 2020-09-14 DIAGNOSIS — I509 Heart failure, unspecified: Secondary | ICD-10-CM

## 2020-09-14 LAB — CBC AND DIFFERENTIAL
HCT: 36 — AB (ref 41–53)
Hemoglobin: 12.4 — AB (ref 13.5–17.5)
Platelets: 119 — AB (ref 150–399)
WBC: 7.8

## 2020-09-14 LAB — CBC: RBC: 3.9 (ref 3.87–5.11)

## 2020-09-15 ENCOUNTER — Encounter: Payer: Self-pay | Admitting: Adult Health

## 2020-09-15 ENCOUNTER — Encounter: Payer: Self-pay | Admitting: Internal Medicine

## 2020-09-15 ENCOUNTER — Non-Acute Institutional Stay (SKILLED_NURSING_FACILITY): Payer: Medicare Other | Admitting: Internal Medicine

## 2020-09-15 DIAGNOSIS — N1832 Chronic kidney disease, stage 3b: Secondary | ICD-10-CM

## 2020-09-15 DIAGNOSIS — I495 Sick sinus syndrome: Secondary | ICD-10-CM | POA: Diagnosis not present

## 2020-09-15 DIAGNOSIS — E860 Dehydration: Secondary | ICD-10-CM

## 2020-09-15 DIAGNOSIS — R531 Weakness: Secondary | ICD-10-CM

## 2020-09-15 DIAGNOSIS — G3184 Mild cognitive impairment, so stated: Secondary | ICD-10-CM | POA: Diagnosis not present

## 2020-09-15 DIAGNOSIS — I480 Paroxysmal atrial fibrillation: Secondary | ICD-10-CM

## 2020-09-15 DIAGNOSIS — I509 Heart failure, unspecified: Secondary | ICD-10-CM

## 2020-09-15 NOTE — Progress Notes (Signed)
Location:  Occupational psychologist of Service:  SNF (31) Provider:   Cindi Carbon, ANP [facility] [phone]  Gayland Curry, DO  Patient Care Team: Gayland Curry, DO as PCP - General (Geriatric Medicine) [facility] Memo, Nicki Reaper, DMD as Consulting Physician (Oral Surgery)  Extended Emergency Contact Information Primary Emergency Contact: Canner,Erubiel Address: [place], [place] [address] [name] of [place] Phone: [phone] Mobile Phone: [phone] Relation: Son Secondary Emergency Contact: Blanchfield,[name] Address: [address]          [place],  [address] [name] of [place] Phone: [phone] Relation: Daughter  Code Status:  DNR Goals of care: Advanced Directive information Advanced Directives 08/19/2020  Does Patient Have a Medical Advance Directive? Yes  Type of Advance Directive Living will;Out of facility DNR (pink MOST or yellow form)  Does patient want to make changes to medical advance directive? -  Copy of [place] in Chart? -  Pre-existing out of facility DNR order (yellow form or pink MOST form) Yellow form placed in chart (order not valid for inpatient use)     Chief Complaint  Patient presents with  . Acute Visit    Chills, nausea, weakness    HPI:  Pt is a 87 y.o. male seen today for symptoms of chills, nausea, and weakness. [facility] but was moved to skilled rehab on 09/13/20 due to the aforementioned complaints. ON 1/9 he had dry heaves and was given a phenergan supp which helped. He now reports only mild nausea and has been able to eat and drink small to moderate amts. A rapid covid test was done which was negative. PCR pending. Pt was weak and not able to stand moved to skilled rehab. He reports his knees have been bothering him and seem to "give" when he stands, particularly the right knee. There is  no injury or swelling or redness to the knee. Only mild pain.  He has not had a fever, sore throat. Cough, sob, congestion, or hypoxia. He is vaccinated and boosted.   His symptoms were reported to the oncall provider and IVF ordered. Nurses were not able to obtain an IV due to difficult access. CBC and BMP were ordered along with UA. UA unremarkable final pending. BMP showed unchanged CKD with normal electrolytes. WBC ws 12 and f/u CBC showed resolution.   Past Medical History:  Diagnosis Date  . Abnormality of gait 02/2010  . Anal and rectal polyp 1980  . Atrial fibrillation ([place]) 11/2009  . Chronic kidney disease, stage III (moderate) ([place]) 08/07/2013  . Deviated nasal septum 11/2009  . Diaphragmatic hernia without mention of obstruction or gangrene 2007  . Diarrhea 02/2010  . Dizziness and giddiness 2009  . Edema 2010  . Essential and other specified forms of tremor 2007  . 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 Hyperglycemia 01/02/2013  . Hypertrophy of prostate without urinary obstruction and other lower urinary tract symptoms (LUTS) 2002  . Insomnia with sleep apnea, unspecified 02/2010  . Irritable bowel syndrome 11/14/2011  . Long term (current) use of anticoagulants 11/2009  . Other abnormal blood chemistry 02/09/2009  . Other B-complex deficiencies 2008  . Other dyspnea and respiratory abnormality 11/2009  . Other malaise and fatigue 2007  . Pain in joint, lower leg 02/2010  . Palpitations 2007  . Reflux esophagitis 2007  .  Sebaceous cyst 2008  . Tension headache 2009  . Undiagnosed cardiac murmurs 2002  . Unspecified constipation 07/04/2012  . Unspecified essential hypertension 2007  . Unspecified hereditary and idiopathic peripheral neuropathy 02/2010  . Unspecified transient cerebral ischemia 2002  . Urinary frequency 10/01/2012   History reviewed. No pertinent surgical history.  Allergies  Allergen Reactions  . Plavix [Clopidogrel Bisulfate]      indigestion    Outpatient Encounter Medications as of 09/14/2020  Medication Sig  . ondansetron (ZOFRAN) 4 MG tablet Take 4 mg by mouth every 8 (eight) hours as needed for nausea or vomiting.  . Acetaminophen 500 MG coapsule Take 2 capsules by mouth at bedtime. And as needed  . apixaban (ELIQUIS) 2.5 MG TABS tablet Take 1 tablet (2.5 mg total) by mouth 2 (two) times daily.  Marland Kitchen doxazosin (CARDURA) 2 MG tablet Take 1 tablet (2 mg total) by mouth daily.  . Emollient (CERAVE DAILY MOISTURIZING) LOTN Apply topically. Apply to legs/feet before compression socks  . Eyelid Cleansers (OCUSOFT EYELID CLEANSING) PADS Apply topically as needed.  . fluticasone (FLONASE) 50 MCG/ACT nasal spray Place 2 sprays into both nostrils 2 (two) times daily.  . furosemide (LASIX) 40 MG tablet Take 40 mg by mouth daily.   . Lactase 9000 units CHEW chew 1 tab (9,000 u) by mouth w/ first bite of milk product PRN lactose intolerant.  . Levothyroxine Sodium 50 MCG CAPS Take 1 capsule (50 mcg total) by mouth daily before breakfast.  . Melatonin 3 MG CAPS Take 1 capsule (3 mg total) by mouth at bedtime.  . metoprolol tartrate (LOPRESSOR) 25 MG tablet Take 0.5 tablets (12.5 mg total) by mouth daily as needed (HR>/= 110bpm).  . mupirocin ointment (BACTROBAN) 2 % Apply 1 application topically 2 (two) times daily. Apply to left buttock skin tag PRN no more than 2 times a day until healed  . omeprazole (PRILOSEC OTC) 20 MG tablet Take 20 mg by mouth daily.  . Polyvinyl Alcohol-Povidone PF 1.4-0.6 % SOLN Apply 1.4 % to eye 2 (two) times daily.   . potassium chloride 20 MEQ/15ML (10%) SOLN Take 15 mLs by mouth daily.   Marland Kitchen trolamine salicylate (ASPERCREME) 10 % cream Apply 1 application topically 2 (two) times daily as needed.  . vitamin B-12 (CYANOCOBALAMIN) 1000 MCG tablet Take 1,000 mcg by mouth daily.   No facility-administered encounter medications on file as of 09/14/2020.    Review of Systems  Constitutional: Positive for  activity change, appetite change and chills. Negative for diaphoresis, fatigue, fever and unexpected weight change.  HENT: Negative for congestion, sinus pressure, sore throat and trouble swallowing.   Respiratory: Negative for cough, shortness of breath, wheezing and stridor.   Cardiovascular: Negative for chest pain, palpitations and leg swelling.  Gastrointestinal: Positive for nausea. Negative for abdominal distention, abdominal pain, constipation and diarrhea.  Genitourinary: Negative for difficulty urinating and dysuria.  Musculoskeletal: Positive for gait problem. Negative for arthralgias, back pain, joint swelling and myalgias.  Neurological: Positive for weakness. Negative for dizziness, seizures, syncope, facial asymmetry, speech difficulty and headaches.  Hematological: Negative for adenopathy. Does not bruise/bleed easily.  Psychiatric/Behavioral: Negative for agitation, behavioral problems and confusion.    Immunization History  Administered Date(s) Administered  . Influenza Inj Mdck Quad Pf 06/23/2016  . Influenza, High Dose Seasonal PF 07/04/2019, 06/26/2020, 07/03/2020  . Influenza,inj,Quad PF,6+ Mos 07/05/2018  . Influenza-Unspecified 06/11/2013, 06/23/2014, 06/25/2015, 06/29/2017  . Moderna SARS-COV2 Booster Vaccination 07/18/2020  . Moderna Sars-Covid-2 Vaccination 09/17/2019, 10/30/2019  .  Pneumococcal Conjugate-13 09/09/2015  . Pneumococcal Polysaccharide-23 09/05/2008  . Td 09/05/1978  . Tdap 02/23/2017  . Zoster Recombinat (Shingrix) 10/02/2017, 03/01/2018   Pertinent  Health Maintenance Due  Topic Date Due  . INFLUENZA VACCINE  Completed  . PNA vac Low Risk Adult  Completed   Fall Risk  04/19/2020 04/15/2020 04/15/2020 12/11/2019 07/31/2019  Falls in the past year? - 0 0 0 0  Comment - - - - [place] Telephone Survey: data to providers prior to load  Number falls in past yr: - 0 0 0 -  Injury with Fall? - 0 0 0 -  Risk for fall due to : History of fall(s);Impaired  balance/gait;Impaired mobility - - - -  Follow up Falls prevention discussed;Education provided;Falls evaluation completed - - - -   Functional Status Survey:    Vitals:   09/15/20 0913  BP: 128/60  Pulse: 62  Resp: 18  Temp: 97.7 F (36.5 C)  SpO2: 95%   There is no height or weight on file to calculate BMI. Physical Exam Vitals and nursing note reviewed.  Constitutional:      General: He is not in acute distress.    Appearance: He is not diaphoretic.  HENT:     Head: Normocephalic and atraumatic.     Mouth/Throat:     Mouth: Mucous membranes are moist.     Pharynx: Oropharynx is clear.  Eyes:     Conjunctiva/sclera: Conjunctivae normal.     Pupils: Pupils are equal, round, and reactive to light.  Neck:     Thyroid: No thyromegaly.     Vascular: No JVD.     Trachea: No tracheal deviation.  Cardiovascular:     Rate and Rhythm: Normal rate and regular rhythm.     Heart sounds: No murmur heard.   Pulmonary:     Effort: Pulmonary effort is normal. No respiratory distress.     Breath sounds: Normal breath sounds. No wheezing.  Abdominal:     General: Bowel sounds are normal. There is no distension.     Palpations: Abdomen is soft.     Tenderness: There is no abdominal tenderness.  Musculoskeletal:     Right lower leg: No edema.     Left lower leg: No edema.  Lymphadenopathy:     Cervical: No cervical adenopathy.  Skin:    General: Skin is warm and dry.  Neurological:     Mental Status: He is alert and oriented to person, place, and time.     Cranial Nerves: No cranial nerve deficit.  Psychiatric:        Mood and Affect: Mood and affect and mood normal.     Labs reviewed: Recent Labs    10/09/19 0000 12/17/19 0000 12/17/19 0700 06/11/20 0000  NA 144 142 142 144  K 4.1 4.1 4.1 4.4  CL 106 102 102 105  CO2 24* 23* 23* 30*  BUN 39* 43* 43* 40*  CREATININE 1.6* 1.8* 1.8* 1.8*  CALCIUM 8.7  --  8.6*  8.6* 9.0   No results for input(s): AST, ALT,  ALKPHOS, BILITOT, PROT, ALBUMIN in the last 8760 hours. Recent Labs    12/17/19 0000  WBC 9.8  HGB 12.5*  HCT 38*  PLT 136*   Lab Results  Component Value Date   TSH 2.59 10/09/2019   Lab Results  Component Value Date   HGBA1C 5.3 03/01/2016   Lab Results  Component Value Date   CHOL 119 03/01/2016   HDL 56  03/01/2016   LDLCALC 52 03/01/2016   TRIG 54 03/01/2016    Significant Diagnostic Results in last 30 days:  DG Chest 2 View  Result Date: 10/23/2016 CLINICAL DATA:  Shortness of breath. EXAM: CHEST  2 VIEW COMPARISON:  04/11/2016 FINDINGS: AP and lateral views of the chest. The cardio pericardial silhouette is enlarged. Interstitial markings are diffusely coarsened with chronic features. Basilar atelectasis bilaterally with tiny left pleural effusion. Bones are diffusely demineralized. IMPRESSION: Cardiomegaly with underlying chronic interstitial changes. Basilar atelectasis with tiny left pleural effusion. Electronically Signed   By: Misty Stanley M.D.   On: 10/23/2016 17:02    Assessment/Plan 1. Nausea Improving with normal abd exam.  WBC improved Add Zofran 4 mg po or IM q 8 prn Covid swab sent and pending.  Continues on isolation  2. Weakness Due to acute illness along with chronic knee issues causing him to having difficulty standing for tranfers. If this does not improve could consider PT.  3. Dehydration Mild due to decreased intake Encourage oral fluid as tolerated and hold lasix and kdur x 2 days then resume Pt is improving and able to eat and drink. Due to risk of CHF and difficult IV access will discontinue IVF order for now.   4. Chronic congestive heart failure, unspecified heart failure type ([place]) He appears to have lost weight on exam but needs a weight.  No current symptoms of fluid overload.  See above   5. Stage 3b chronic kidney disease (HCC) Unchanged on labs 1/10 Hold lasix as above due to decreased intake and perceived weight loss along  with acute illness  Continue to monitor    Family/ staff Communication: discussed with Mr. Coster and the nurse   Labs/tests ordered: PCR Pending

## 2020-09-15 NOTE — Progress Notes (Signed)
Provider:  Rexene Edison. Mariea Clonts, D.O., C.M.D. Location:  [facility] Room Number: 160 A Place of Service:  SNF (31)  PCP: Gayland Curry, DO Patient Care Team: Gayland Curry, DO as PCP - General (Geriatric Medicine) [facility] [name], DMD as Consulting Physician (Oral Surgery)  Extended Emergency Contact Information Primary Emergency Contact: Lecrone,Massai Address: [place], [place] [address] [name] of [place] Phone: [phone] Mobile Phone: [phone] Relation: Son Secondary Emergency Contact: Wieting,[name] Address: [address]          [place], [place] [address] [name] of [employer] Phone: [phone] Relation: Daughter  Code Status: DNR Goals of Care: Advanced Directive information Advanced Directives 09/15/2020  Does Patient Have a Medical Advance Directive? Yes  Type of Advance Directive Living will;Out of facility DNR (pink MOST or yellow form)  Does patient want to make changes to medical advance directive? No - Patient declined  Copy of [place] in Chart? Yes - validated most recent copy scanned in chart (See row information)  Pre-existing out of facility DNR order (yellow form or pink MOST form) Yellow form placed in chart (order not valid for inpatient use)   Chief Complaint  Patient presents with  . New Admit To SNF    New Admit to SNF for nausea, vomiting, violent chills, weakness, loss of appetite, Covid swabbed 09/13/2020 and pending. CBC 1/10    HPI: Patient is a 87 y.o. male seen today for admission to [facility] due to weakness, nausea, vomiting, violent chills, loss of appetite, covid swab done 09/13/20 (pending) and CBC 1/10.    Looking back through nursing notes, it appears he started to feel bad on 1/7 when he was increasingly weak and unsteady taking multiple attempts to stand and requiring two CNAs to help him.  1/9, "c/O  "feeling yucky, and the thought of food makes his stomach feel sick". No other symptoms voiced, sitting in recliner stating "just going to go back to sleep". VS 110/[id number]-[id number] sat 97%RA." (stable bradycardia for him). 1.5 hrs later he had dry heaves and cold chills, excessive thirst and urination. Phenergan suppository was given.  On call NP putting him on isolation d/t nausea and chills and we will be swabbing him to test for Covid-19. Received orders for UA/C&S, CBC, and BMP.  1/9 rapid covid was negative. WBC 12.1 and cr 1.73 which was baseline but improved in 24 hrs to 7.8 1/10.  UA negative, culture pending.  IVFs were ordered with D5W at 70cc/h Transferred to rehab.  Diet:  Regular diet, chopped/Dysphagia 3, thin liq no straws.   Since coming over here, he's had no further n/v or diarrhea.  He remains weak with generalized malaise and intake was less but hydrating very well.  Lasix has been on hold 1/10 and 1/11, then to resume.  zofran was ordered in place of phenergan (also is standing order anyway).  He's been drinking very well though not eating that well yet.  Family attempted to visit this am, but PCR result still not back on covid test.  They brought him more aspercreme for his knee and batteries for his hearing aids.   Past Medical History:  Diagnosis Date  . Abnormality of gait 02/2010  . Anal and rectal polyp 1980  . Atrial fibrillation ([facility]) 11/2009  . Chronic kidney disease, stage III (moderate) ([facility]) 08/07/2013  . Deviated nasal septum  11/2009  . Diaphragmatic hernia without mention of obstruction or gangrene 2007  . Diarrhea 02/2010  . Dizziness and giddiness 2009  . Edema 2010  . Essential and other specified forms of tremor 2007  . 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 Hyperglycemia 01/02/2013  . Hypertrophy of prostate without urinary obstruction and other lower urinary tract symptoms (LUTS) 2002  . Insomnia with sleep apnea, unspecified 02/2010  .  Irritable bowel syndrome 11/14/2011  . Long term (current) use of anticoagulants 11/2009  . Other abnormal blood chemistry 02/09/2009  . Other B-complex deficiencies 2008  . Other dyspnea and respiratory abnormality 11/2009  . Other malaise and fatigue 2007  . Pain in joint, lower leg 02/2010  . Palpitations 2007  . Reflux esophagitis 2007  . Sebaceous cyst 2008  . Tension headache 2009  . Undiagnosed cardiac murmurs 2002  . Unspecified constipation 07/04/2012  . Unspecified essential hypertension 2007  . Unspecified hereditary and idiopathic peripheral neuropathy 02/2010  . Unspecified transient cerebral ischemia 2002  . Urinary frequency 10/01/2012   History reviewed. No pertinent surgical history.  Social History   Socioeconomic History  . Marital status: Widowed    Spouse name: Not on file  . Number of children: Not on file  . Years of education: Not on file  . Highest education level: Not on file  Occupational History  . Occupation: retired   Tobacco Use  . Smoking status: Never Smoker  . Smokeless tobacco: Never Used  Vaping Use  . Vaping Use: Never used  Substance and Sexual Activity  . Alcohol use: Yes    Comment: 2oz a week  . Drug use: No  . Sexual activity: Never  Other Topics Concern  . Not on file  Social History Narrative   Lives at [facility] since 02/1992   Widowed   Living Will, DNR   Retired Futures trader with walking, power wheelchair   Exercise: none   Social Determinants of Radio broadcast assistant Strain: Not on file  Food Insecurity: Not on file  Transportation Needs: Not on file  Physical Activity: Not on file  Stress: Not on file  Social Connections: Not on file    reports that he has never smoked. He has never used smokeless tobacco. He reports current alcohol use. He reports that he does not use drugs.  Functional Status Survey:    Family History  Problem Relation Age of Onset  . Congestive Heart Failure Mother   . Diabetes  Father   . Heart disease Father   . Lung cancer Sister   . Kidney disease Sister     Health Maintenance  Topic Date Due  . COVID-19 Vaccine (4 - Booster for Moderna series) 01/15/2021  . TETANUS/TDAP  02/24/2027  . INFLUENZA VACCINE  Completed  . PNA vac Low Risk Adult  Completed    Allergies  Allergen Reactions  . Plavix [Clopidogrel Bisulfate]     indigestion    Outpatient Encounter Medications as of 09/15/2020  Medication Sig  . Acetaminophen 500 MG coapsule Take 2 capsules by mouth at bedtime. And as needed  . apixaban (ELIQUIS) 2.5 MG TABS tablet Take 1 tablet (2.5 mg total) by mouth 2 (two) times daily.  Marland Kitchen doxazosin (CARDURA) 2 MG tablet Take 1 tablet (2 mg total) by mouth daily.  . Emollient (CERAVE DAILY MOISTURIZING) LOTN Apply topically. Apply to legs/feet before compression socks  . Eyelid Cleansers (OCUSOFT EYELID CLEANSING) PADS Apply  topically as needed.  . fluticasone (FLONASE) 50 MCG/ACT nasal spray Place 2 sprays into both nostrils 2 (two) times daily.  . furosemide (LASIX) 40 MG tablet Take 40 mg by mouth daily.   . Lactase 9000 units CHEW chew 1 tab (9,000 u) by mouth w/ first bite of milk product PRN lactose intolerant.  . Levothyroxine Sodium 50 MCG CAPS Take 1 capsule (50 mcg total) by mouth daily before breakfast.  . Melatonin 3 MG CAPS Take 1 capsule (3 mg total) by mouth at bedtime.  . metoprolol tartrate (LOPRESSOR) 25 MG tablet Take 0.5 tablets (12.5 mg total) by mouth daily as needed (HR>/= 110bpm).  . mupirocin ointment (BACTROBAN) 2 % Apply 1 application topically 2 (two) times daily. Apply to left buttock skin tag PRN no more than 2 times a day until healed  . omeprazole (PRILOSEC OTC) 20 MG tablet Take 20 mg by mouth daily.  . ondansetron (ZOFRAN) 4 MG tablet Take 4 mg by mouth every 8 (eight) hours as needed for nausea or vomiting.  . Polyvinyl Alcohol-Povidone PF 1.4-0.6 % SOLN Apply 1.4 % to eye 2 (two) times daily.   . potassium chloride 20  MEQ/15ML (10%) SOLN Take 15 mLs by mouth daily.   Marland Kitchen trolamine salicylate (ASPERCREME) 10 % cream Apply 1 application topically 2 (two) times daily as needed.  . vitamin B-12 (CYANOCOBALAMIN) 1000 MCG tablet Take 1,000 mcg by mouth daily.   No facility-administered encounter medications on file as of 09/15/2020.    Review of Systems  Constitutional: Positive for malaise/fatigue. Negative for chills and fever.  HENT: Positive for hearing loss. Negative for congestion and sore throat.   Eyes: Negative for blurred vision.  Respiratory: Negative for cough and shortness of breath.   Cardiovascular: Positive for palpitations. Negative for chest pain and leg swelling.       Reports bad spell of rapid afib this am  Gastrointestinal: Negative for abdominal pain, blood in stool, constipation, melena, nausea and vomiting.       Appetite remains diminished with loss of taste and smell reported  Genitourinary: Negative for dysuria.  Musculoskeletal: Positive for joint pain. Negative for falls.  Neurological: Negative for dizziness and loss of consciousness.  Endo/Heme/Allergies: Bruises/bleeds easily.  Psychiatric/Behavioral: Positive for memory loss. Negative for depression. The patient is not nervous/anxious and does not have insomnia.        MCI    Vitals:   09/15/20 1023  BP: (!) 130/51  Pulse: (!) 58  Temp: 98.8 F (37.1 C)  SpO2: 95%  Weight: 174 lb 3.2 oz (79 kg)  Height: 5\' 9"  (1.753 m)   Body mass index is 25.72 kg/m. Physical Exam Vitals reviewed.  Constitutional:      General: He is not in acute distress.    Appearance: Normal appearance. He is not toxic-appearing.  HENT:     Head: Normocephalic and atraumatic.     Right Ear: External ear normal.     Left Ear: External ear normal.     Ears:     Comments: HOH; hearing aids    Nose: No congestion.     Mouth/Throat:     Pharynx: Oropharynx is clear.  Eyes:     Extraocular Movements: Extraocular movements intact.      Conjunctiva/sclera: Conjunctivae normal.     Pupils: Pupils are equal, round, and reactive to light.     Comments: glasses  Cardiovascular:     Rate and Rhythm: Bradycardia present. Rhythm irregular.  Pulmonary:  Effort: Pulmonary effort is normal.     Breath sounds: Normal breath sounds. No wheezing, rhonchi or rales.  Abdominal:     General: Bowel sounds are normal.     Palpations: Abdomen is soft.     Tenderness: There is no abdominal tenderness. There is no guarding or rebound.  Musculoskeletal:        General: Normal range of motion.     Cervical back: Neck supple.     Right lower leg: No edema.     Left lower leg: No edema.  Lymphadenopathy:     Cervical: No cervical adenopathy.  Skin:    General: Skin is warm and dry.  Neurological:     General: No focal deficit present.     Mental Status: He is alert and oriented to person, place, and time.     Motor: Weakness present.     Gait: Gait abnormal.  Psychiatric:        Mood and Affect: Mood normal.        Behavior: Behavior normal.     Labs reviewed: Basic Metabolic Panel: Recent Labs    12/17/19 0700 06/11/20 0000 09/13/20 0000  NA 142 144 141  K 4.1 4.4 3.7  CL 102 105 104  CO2 23* 30* 26*  BUN 43* 40* 37*  CREATININE 1.8* 1.8* 1.7*  CALCIUM 8.6*  8.6* 9.0 8.5*   Liver Function Tests: No results for input(s): AST, ALT, ALKPHOS, BILITOT, PROT, ALBUMIN in the last 8760 hours. No results for input(s): LIPASE, AMYLASE in the last 8760 hours. No results for input(s): AMMONIA in the last 8760 hours. CBC: Recent Labs    12/17/19 0000 09/13/20 0000  WBC 9.8 12.1  NEUTROABS  --  10.50  HGB 12.5* 12.8*  HCT 38* 39*  PLT 136* 146*   Cardiac Enzymes: No results for input(s): CKTOTAL, CKMB, CKMBINDEX, TROPONINI in the last 8760 hours. BNP: Invalid input(s): POCBNP Lab Results  Component Value Date   HGBA1C 5.3 03/01/2016   Lab Results  Component Value Date   TSH 2.59 10/09/2019   Lab Results   Component Value Date   VITAMINB12 1,095 03/20/2017   No results found for: FOLATE No results found for: IRON, TIBC, FERRITIN  Imaging and Procedures obtained prior to SNF admission: DG Chest 2 View  Result Date: 10/23/2016 CLINICAL DATA:  Shortness of breath. EXAM: CHEST  2 VIEW COMPARISON:  04/11/2016 FINDINGS: AP and lateral views of the chest. The cardio pericardial silhouette is enlarged. Interstitial markings are diffusely coarsened with chronic features. Basilar atelectasis bilaterally with tiny left pleural effusion. Bones are diffusely demineralized. IMPRESSION: Cardiomegaly with underlying chronic interstitial changes. Basilar atelectasis with tiny left pleural effusion. Electronically Signed   By: Misty Stanley M.D.   On: 10/23/2016 17:02    Assessment/Plan 1. Weakness -appetite remains poor vs baseline but drinking very well -has improved per nursing -continue additional assistance in rehab  2. Dehydration -improving - no edema right now and I haven't seen his legs this skinny in years  3. Sick sinus syndrome (HCC) -ongoing, had a spell of rapid afib this am he reports but resolved  4. Paroxysmal atrial fibrillation (HCC) -cont current mgt  5. Mild cognitive impairment -mild memory loss at advanced age, remains very alert and appropriate  6. Stage 3b chronic kidney disease (HCC) -Avoid nephrotoxic agents like nsaids, dose adjust renally excreted meds, hydrate.  7. Chronic congestive heart failure, unspecified heart failure type ([place]) Drinking well Ok to resume diuretic and  potassium tomorrow as planned  Still not eating well and has early satiety  Family/ staff Communication: d/w rehab nurse  Labs/tests ordered: no new needed, COVID PCR still pending  Brendt Dible L. Romaldo Saville, D.O. [facility] [address], [place] [address] Cell Phone (Mon-Fri 8am-5pm):  [phone] On Call:  [phone] & follow prompts after  5pm & weekends Office Phone:  [phone] Office Fax:  [phone]

## 2020-09-16 ENCOUNTER — Encounter: Payer: Self-pay | Admitting: *Deleted

## 2020-09-17 DIAGNOSIS — Z9189 Other specified personal risk factors, not elsewhere classified: Secondary | ICD-10-CM | POA: Diagnosis not present

## 2020-09-17 DIAGNOSIS — Z20828 Contact with and (suspected) exposure to other viral communicable diseases: Secondary | ICD-10-CM | POA: Diagnosis not present

## 2020-10-26 ENCOUNTER — Encounter: Payer: Self-pay | Admitting: Internal Medicine

## 2020-11-19 ENCOUNTER — Encounter: Payer: Self-pay | Admitting: Adult Health

## 2020-11-19 ENCOUNTER — Non-Acute Institutional Stay (SKILLED_NURSING_FACILITY): Payer: Medicare Other | Admitting: Adult Health

## 2020-11-19 DIAGNOSIS — N1832 Chronic kidney disease, stage 3b: Secondary | ICD-10-CM | POA: Diagnosis not present

## 2020-11-19 DIAGNOSIS — I480 Paroxysmal atrial fibrillation: Secondary | ICD-10-CM | POA: Diagnosis not present

## 2020-11-19 DIAGNOSIS — R54 Age-related physical debility: Secondary | ICD-10-CM

## 2020-11-19 DIAGNOSIS — I495 Sick sinus syndrome: Secondary | ICD-10-CM

## 2020-11-19 DIAGNOSIS — I509 Heart failure, unspecified: Secondary | ICD-10-CM | POA: Diagnosis not present

## 2020-11-19 NOTE — Progress Notes (Signed)
Location:  Occupational psychologist of Service:  SNF (31) Provider:   Cindi Carbon, ANP [place] [phone]   Gary Curry, DO  Patient Care Team: Gary Curry, DO as PCP - General (Geriatric Medicine) Community, [facility] Derrill [name], Gary [name], DMD as Consulting Physician (Oral Surgery)  Extended Emergency Contact Information Primary Emergency Contact: Gary [name],Gary [name] Address: [place], [place] [address] Gary [name] of [name] Phone: [phone] Mobile Phone: [phone] Relation: Son Secondary Emergency Contact: Gary [name],Gary [name] Address: [address]          [place], [place] [address] Gary [name] of [place] Phone: [phone] Relation: Daughter  Code Status:  DNR Goals of care: Advanced Directive information Advanced Directives 09/15/2020  Does Patient Have a Medical Advance Directive? Yes  Type of Advance Directive Living will;Out of facility DNR (pink MOST or yellow form)  Does patient want to make changes to medical advance directive? No - Patient declined  Copy of [place] in Chart? Yes - validated most recent copy scanned in chart (See row information)  Pre-existing out of facility DNR order (yellow form or pink MOST form) Yellow form placed in chart (order not valid for inpatient use)     Chief Complaint  Patient presents with  . Medical Management of Chronic Issues    HPI:  Pt is a 87 y.o. male seen today for medical management of chronic diseases.    Gary [name] recently moved to skilled care after a viral illness (covid neg) in Jan. He was weaker and required a stand up lift for transfers.  The staff report that at times he is too weak for the stand up lift and would be safer with a hoyer.   He has few complaints. He feels palpitations in the morning and takes some deep breaths and they go away.  Pulse ranges 50-90 in matrix. No issues with syncope.   He denies  any sob or doe. His weight is quite variable and difficult to interpret in the records.   Complains his skin tears to easily.   Complains his bottom hurts due to a wound on his buttocks. The nurses place a polymem on this and state that it is small but never fully heals due to the fact he sits on it in the recliner or power wheel chair most of the day. He has a chair cushion and mattress pad.    Past Medical History:  Diagnosis Date  . Abnormality of gait 02/2010  . Anal and rectal polyp 1980  . Atrial fibrillation ([place] [name]) 11/2009  . Chronic kidney disease, stage III (moderate) ([place]) 08/07/2013  . Deviated nasal septum 11/2009  . Diaphragmatic hernia without mention of obstruction or gangrene 2007  . Diarrhea 02/2010  . Dizziness and giddiness 2009  . Edema 2010  . Essential and other specified forms of tremor 2007  . 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 Hyperglycemia 01/02/2013  . Hypertrophy of prostate without urinary obstruction and other lower urinary tract symptoms (LUTS) 2002  . Insomnia with sleep apnea, unspecified 02/2010  . Irritable bowel syndrome 11/14/2011  . Long term (current) use of anticoagulants 11/2009  . Other abnormal blood chemistry 02/09/2009  . Other B-complex deficiencies 2008  . Other dyspnea and respiratory abnormality 11/2009  . Other malaise and fatigue 2007  . Pain in joint, lower leg 02/2010  . Palpitations 2007  . Reflux  esophagitis 2007  . Sebaceous cyst 2008  . Tension headache 2009  . Undiagnosed cardiac murmurs 2002  . Unspecified constipation 07/04/2012  . Unspecified essential hypertension 2007  . Unspecified hereditary and idiopathic peripheral neuropathy 02/2010  . Unspecified transient cerebral ischemia 2002  . Urinary frequency 10/01/2012   History reviewed. No pertinent surgical history.  Allergies  Allergen Reactions  . Plavix [Clopidogrel Bisulfate]     indigestion    Outpatient Encounter Medications as of 11/19/2020   Medication Sig  . Acetaminophen 500 MG coapsule Take 2 capsules by mouth at bedtime. And as needed  . apixaban (ELIQUIS) 2.5 MG TABS tablet Take 1 tablet (2.5 mg total) by mouth 2 (two) times daily.  Marland Kitchen doxazosin (CARDURA) 2 MG tablet Take 1 tablet (2 mg total) by mouth daily.  . Emollient (CERAVE DAILY MOISTURIZING) LOTN Apply topically. Apply to legs/feet before compression socks  . Eyelid Cleansers (OCUSOFT EYELID CLEANSING) PADS Apply topically as needed.  . fluticasone (FLONASE) 50 MCG/ACT nasal spray Place 2 sprays into both nostrils 2 (two) times daily.  . furosemide (LASIX) 40 MG tablet Take 40 mg by mouth daily.   . Lactase 9000 units CHEW chew 1 tab (9,000 u) by mouth w/ first bite of milk product PRN lactose intolerant.  . Levothyroxine Sodium 50 MCG CAPS Take 1 capsule (50 mcg total) by mouth daily before breakfast.  . Melatonin 3 MG CAPS Take 1 capsule (3 mg total) by mouth at bedtime.  . metoprolol tartrate (LOPRESSOR) 25 MG tablet Take 0.5 tablets (12.5 mg total) by mouth daily as needed (HR>/= 110bpm).  . mupirocin ointment (BACTROBAN) 2 % Apply 1 application topically 2 (two) times daily. Apply to left buttock skin tag PRN no more than 2 times a day until healed  . omeprazole (PRILOSEC OTC) 20 MG tablet Take 20 mg by mouth daily.  . ondansetron (ZOFRAN) 4 MG tablet Take 4 mg by mouth every 8 (eight) hours as needed for nausea or vomiting.  . Polyvinyl Alcohol-Povidone PF 1.4-0.6 % SOLN Apply 1.4 % to eye 2 (two) times daily.   . potassium chloride 20 MEQ/15ML (10%) SOLN Take 15 mLs by mouth daily.   Marland Kitchen trolamine salicylate (ASPERCREME) 10 % cream Apply 1 application topically 2 (two) times daily as needed.  . vitamin B-12 (CYANOCOBALAMIN) 1000 MCG tablet Take 1,000 mcg by mouth daily.   No facility-administered encounter medications on file as of 11/19/2020.    Review of Systems  Constitutional: Positive for activity change. Negative for appetite change, chills, diaphoresis,  fatigue, fever and unexpected weight change.  Respiratory: Negative for cough, shortness of breath, wheezing and stridor.   Cardiovascular: Positive for palpitations. Negative for chest pain and leg swelling.  Gastrointestinal: Negative for abdominal distention, abdominal pain, constipation and diarrhea.  Genitourinary: Negative for difficulty urinating and dysuria.  Musculoskeletal: Positive for arthralgias and gait problem. Negative for back pain, joint swelling and myalgias.  Skin: Positive for wound.  Neurological: Negative for dizziness, seizures, syncope, facial asymmetry, speech difficulty, weakness and headaches.  Hematological: Negative for adenopathy. Does not bruise/bleed easily.  Psychiatric/Behavioral: Negative for agitation, behavioral problems and confusion.    Immunization History  Administered Date(s) Administered  . Influenza Inj Mdck Quad Pf 06/23/2016  . Influenza, High Dose Seasonal PF 07/04/2019, 06/26/2020, 07/03/2020  . Influenza,inj,Quad PF,6+ Mos 07/05/2018  . Influenza-Unspecified 06/11/2013, 06/23/2014, 06/25/2015, 06/29/2017  . Moderna SARS-COV2 Booster Vaccination 07/18/2020  . Moderna Sars-Covid-2 Vaccination 09/17/2019, 10/30/2019  . Pneumococcal Conjugate-13 09/09/2015  . Pneumococcal  Polysaccharide-23 09/05/2008  . Td 09/05/1978  . Tdap 02/23/2017  . Zoster Recombinat (Shingrix) 10/02/2017, 03/01/2018   Pertinent  Health Maintenance Due  Topic Date Due  . INFLUENZA VACCINE  Completed  . PNA vac Low Risk Adult  Completed   Fall Risk  04/19/2020 04/15/2020 04/15/2020 12/11/2019 07/31/2019  Falls in the past year? - 0 0 0 0  Comment - - - - Emmi Telephone Survey: data to providers prior to load  Number falls in past yr: - 0 0 0 -  Injury with Fall? - 0 0 0 -  Risk for fall due to : History of fall(s);Impaired balance/gait;Impaired mobility - - - -  Follow up Falls prevention discussed;Education provided;Falls evaluation completed - - - -   Functional  Status Survey:    Vitals:   11/19/20 1025  Weight: 195 lb 3.2 oz (88.5 kg)   Body mass index is 28.83 kg/m.  Wt Readings from Last 3 Encounters:  11/19/20 195 lb 3.2 oz (88.5 kg)  09/15/20 174 lb 3.2 oz (79 kg)  08/04/20 205 lb (93 kg)    Physical Exam Vitals and nursing note reviewed.  Constitutional:      General: He is not in acute distress.    Appearance: He is not diaphoretic.  HENT:     Head: Normocephalic and atraumatic.  Neck:     Thyroid: No thyromegaly.     Vascular: No JVD.     Trachea: No tracheal deviation.  Cardiovascular:     Rate and Rhythm: Normal rate. Rhythm irregular.     Heart sounds: Murmur heard.    Pulmonary:     Effort: Pulmonary effort is normal. No respiratory distress.     Breath sounds: Normal breath sounds. No wheezing.  Abdominal:     General: Bowel sounds are normal. There is no distension.     Palpations: Abdomen is soft.     Tenderness: There is no abdominal tenderness.  Musculoskeletal:     Cervical back: Rigidity present. No tenderness.     Right lower leg: Edema (+1) present.     Left lower leg: Edema (trace) present.  Lymphadenopathy:     Cervical: No cervical adenopathy.  Skin:    General: Skin is warm and dry.     Comments: Skin tear to left hand and left leg covered with polymem  Neurological:     Mental Status: He is alert and oriented to person, place, and time.     Cranial Nerves: No cranial nerve deficit.  Psychiatric:        Mood and Affect: Mood normal.     Labs reviewed: Recent Labs    12/17/19 0700 06/11/20 0000 09/13/20 0000  NA 142 144 141  K 4.1 4.4 3.7  CL 102 105 104  CO2 23* 30* 26*  BUN 43* 40* 37*  CREATININE 1.8* 1.8* 1.7*  CALCIUM 8.6*  8.6* 9.0 8.5*   No results for input(s): AST, ALT, ALKPHOS, BILITOT, PROT, ALBUMIN in the last 8760 hours. Recent Labs    12/17/19 0000 09/13/20 0000 09/14/20 0000  WBC 9.8 12.1 7.8  NEUTROABS  --  10.50  --   HGB 12.5* 12.8* 12.4*  HCT 38* 39* 36*   PLT 136* 146* 119*   Lab Results  Component Value Date   TSH 2.59 10/09/2019   Lab Results  Component Value Date   HGBA1C 5.3 03/01/2016   Lab Results  Component Value Date   CHOL 119 03/01/2016   HDL 56  03/01/2016   LDLCALC 52 03/01/2016   TRIG 54 03/01/2016    Significant Diagnostic Results in last 30 days:  No results found.  Assessment/Plan  1. Paroxysmal atrial fibrillation (HCC) Continues with periodic palpitations which is not new but has tachybrady issues as well making this difficulty to treat. He is not symptomatic at this time with good control. Goals of care are comfort based at 87 years old.   2. Chronic congestive heart failure, unspecified heart failure type ([place]) Appears compensated at this time.  Continue Lasix 40 mg qd   3. Sick sinus syndrome [facility]) Not currently symptomatic. No pacemaker due to goals of care.   4. Stage 3b chronic kidney disease ([place]) Continue to periodically monitor BMP and avoid nephrotoxic agents  5. Frailty syndrome in geriatric patient Progressively weaker over the past year now requiring skilled care Prone to skin tears and dry skin Prone to pressure injury due to immobility and has delayed wound healing.    Family/ staff Communication: resident   Labs/tests ordered:  NA

## 2020-11-25 ENCOUNTER — Encounter: Payer: Self-pay | Admitting: Internal Medicine

## 2020-11-27 ENCOUNTER — Non-Acute Institutional Stay (SKILLED_NURSING_FACILITY): Payer: Medicare Other | Admitting: Adult Health

## 2020-11-27 ENCOUNTER — Encounter: Payer: Self-pay | Admitting: Adult Health

## 2020-11-27 DIAGNOSIS — L89322 Pressure ulcer of left buttock, stage 2: Secondary | ICD-10-CM

## 2020-11-27 DIAGNOSIS — N3945 Continuous leakage: Secondary | ICD-10-CM

## 2020-11-27 NOTE — Progress Notes (Signed)
Location:  [place] Room Number: 143-A Place of Service:  SNF [phone]) Provider:  Virgie Dad, MD  Patient Care Team: Virgie Dad, MD as PCP - General (Internal Medicine) [facility], [facility] [name], DMD as Consulting Physician (Oral Surgery)  Extended Emergency Contact Information Primary Emergency Contact: Dacy,Jillian Address: [place], [place] [address] [name] of [place] Phone: [phone] Mobile Phone: [phone] Relation: Son Secondary Emergency Contact: Studstill,[name] Address: [address]          [place], [place] [address] [name] of [place] Phone: [phone] Relation: Daughter  Code Status:  DNR  Goals of care: Advanced Directive information Advanced Directives 11/27/2020  Does Patient Have a Medical Advance Directive? Yes  Type of Paramedic of [place];Living will;Out of facility DNR (pink MOST or yellow form)  Does patient want to make changes to medical advance directive? No - Patient declined  Copy of [place] in Chart? Yes - validated most recent copy scanned in chart (See row information)  Pre-existing out of facility DNR order (yellow form or pink MOST form) Yellow form placed in chart (order not valid for inpatient use)     Chief Complaint  Patient presents with  . Acute Visit    Wound assessment     HPI:  Pt is a 87 y.o. male seen for wound assessment. He reports that he has had a wound to his buttocks for two years that does not heal. The nurses are currently applying polymem to the area. They report that he sits on his bottom all day in the chair and does not relieve pressure. He has some pain to the wound but no itching, fever, or drainage. He has a cushion in his motorized chair to help with pressure relief.   Past Medical History:  Diagnosis Date  . Abnormality of gait 02/2010  . Anal and rectal  polyp 1980  . Atrial fibrillation ([place]) 11/2009  . Chronic kidney disease, stage III (moderate) ([place]) 08/07/2013  . Deviated nasal septum 11/2009  . Diaphragmatic hernia without mention of obstruction or gangrene 2007  . Diarrhea 02/2010  . Dizziness and giddiness 2009  . Edema 2010  . Essential and other specified forms of tremor 2007  . 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 Hyperglycemia 01/02/2013  . Hypertrophy of prostate without urinary obstruction and other lower urinary tract symptoms (LUTS) 2002  . Insomnia with sleep apnea, unspecified 02/2010  . Irritable bowel syndrome 11/14/2011  . Long term (current) use of anticoagulants 11/2009  . Other abnormal blood chemistry 02/09/2009  . Other B-complex deficiencies 2008  . Other dyspnea and respiratory abnormality 11/2009  . Other malaise and fatigue 2007  . Pain in joint, lower leg 02/2010  . Palpitations 2007  . Reflux esophagitis 2007  . Sebaceous cyst 2008  . Tension headache 2009  . Undiagnosed cardiac murmurs 2002  . Unspecified constipation 07/04/2012  . Unspecified essential hypertension 2007  . Unspecified hereditary and idiopathic peripheral neuropathy 02/2010  . Unspecified transient cerebral ischemia 2002  . Urinary frequency 10/01/2012   History reviewed. No pertinent surgical history.  Allergies  Allergen Reactions  . Plavix [Clopidogrel Bisulfate]     indigestion    Outpatient Encounter Medications as of 11/27/2020  Medication Sig  . Acetaminophen 500 MG coapsule Take 2 capsules by mouth at bedtime. And as needed  .  apixaban (ELIQUIS) 2.5 MG TABS tablet Take 1 tablet (2.5 mg total) by mouth 2 (two) times daily.  Marland Kitchen doxazosin (CARDURA) 2 MG tablet Take 1 tablet (2 mg total) by mouth daily.  . Emollient (CERAVE DAILY MOISTURIZING) LOTN Apply topically. Apply to legs/feet before compression socks  . Eyelid Cleansers (OCUSOFT EYELID CLEANSING) PADS Apply topically daily. In the morning and as needed  through out the day for matted eyes  . fluticasone (FLONASE) 50 MCG/ACT nasal spray Place 2 sprays into both nostrils 2 (two) times daily.  . furosemide (LASIX) 40 MG tablet Take 40 mg by mouth daily.   . Lactase 9000 units CHEW chew 1 tab (9,000 u) by mouth w/ first bite of milk product PRN lactose intolerant.  . Levothyroxine Sodium 50 MCG CAPS Take 1 capsule (50 mcg total) by mouth daily before breakfast.  . Melatonin 3 MG CAPS Take 1 capsule (3 mg total) by mouth at bedtime.  . metoprolol tartrate (LOPRESSOR) 25 MG tablet Take 0.5 tablets (12.5 mg total) by mouth daily as needed (HR>/= 110bpm).  . mupirocin ointment (BACTROBAN) 2 % Apply 1 application topically 2 (two) times daily. Apply to left buttock skin tag PRN no more than 2 times a day until healed  . omeprazole (PRILOSEC OTC) 20 MG tablet Take 20 mg by mouth daily.  . ondansetron (ZOFRAN) 4 MG tablet Take 4 mg by mouth every 8 (eight) hours as needed for nausea or vomiting.  . Polyvinyl Alcohol-Povidone PF 1.4-0.6 % SOLN Apply 1.4 % to eye 2 (two) times daily.   . potassium chloride 20 MEQ/15ML (10%) SOLN Take 15 mLs by mouth daily.   Marland Kitchen trolamine salicylate (ASPERCREME) 10 % cream Apply 1 application topically 2 (two) times daily as needed.  . vitamin B-12 (CYANOCOBALAMIN) 1000 MCG tablet Take 1,000 mcg by mouth daily.   No facility-administered encounter medications on file as of 11/27/2020.    Review of Systems  Constitutional: Negative for activity change, appetite change, chills, diaphoresis, fatigue, fever and unexpected weight change.  Skin: Positive for color change and wound. Negative for pallor and rash.    Immunization History  Administered Date(s) Administered  . Influenza Inj Mdck Quad Pf 06/23/2016  . Influenza, High Dose Seasonal PF 07/04/2019, 06/26/2020, 07/03/2020  . Influenza,inj,Quad PF,6+ Mos 07/05/2018  . Influenza-Unspecified 06/11/2013, 06/23/2014, 06/25/2015, 06/29/2017, 06/26/2020  . Moderna SARS-COV2  Booster Vaccination 07/18/2020  . Moderna Sars-Covid-2 Vaccination 09/17/2019, 10/30/2019, 07/18/2020  . Pneumococcal Conjugate-13 09/09/2015  . Pneumococcal Polysaccharide-23 09/05/2008  . Td 09/05/1978  . Tdap 02/23/2017  . Zoster Recombinat (Shingrix) 10/02/2017, 03/01/2018   Pertinent  Health Maintenance Due  Topic Date Due  . INFLUENZA VACCINE  Completed  . PNA vac Low Risk Adult  Completed   Fall Risk  04/19/2020 04/15/2020 04/15/2020 12/11/2019 07/31/2019  Falls in the past year? - 0 0 0 0  Comment - - - - Emmi Telephone Survey: data to providers prior to load  Number falls in past yr: - 0 0 0 -  Injury with Fall? - 0 0 0 -  Risk for fall due to : History of fall(s);Impaired balance/gait;Impaired mobility - - - -  Follow up Falls prevention discussed;Education provided;Falls evaluation completed - - - -   Functional Status Survey:    Vitals:   11/27/20 1031  BP: 118/66  Pulse: (!) 58  Resp: 20  Temp: (!) 97.5 F (36.4 C)  SpO2: 97%  Weight: 196 lb 3.2 oz (89 kg)  Height: 5\' 9"  (1.753  m)   Body mass index is 28.97 kg/m. Physical Exam Vitals and nursing note reviewed.  Constitutional:      Appearance: Normal appearance.  Skin:    General: Skin is warm and dry.     Comments: Left buttock with open area 1 cm round with 100% pink tissue no drainage or swelling. Quite tender. Surrounding area is erythematous.   Neurological:     Mental Status: He is alert and oriented to person, place, and time. Mental status is at baseline.     Labs reviewed: Recent Labs    12/17/19 0700 06/11/20 0000 09/13/20 0000  NA 142 144 141  K 4.1 4.4 3.7  CL 102 105 104  CO2 23* 30* 26*  BUN 43* 40* 37*  CREATININE 1.8* 1.8* 1.7*  CALCIUM 8.6*  8.6* 9.0 8.5*   No results for input(s): AST, ALT, ALKPHOS, BILITOT, PROT, ALBUMIN in the last 8760 hours. Recent Labs    12/17/19 0000 09/13/20 0000 09/14/20 0000  WBC 9.8 12.1 7.8  NEUTROABS  --  10.50  --   HGB 12.5* 12.8* 12.4*   HCT 38* 39* 36*  PLT 136* 146* 119*   Lab Results  Component Value Date   TSH 2.59 10/09/2019   Lab Results  Component Value Date   HGBA1C 5.3 03/01/2016   Lab Results  Component Value Date   CHOL 119 03/01/2016   HDL 56 03/01/2016   LDLCALC 52 03/01/2016   TRIG 54 03/01/2016    Significant Diagnostic Results in last 30 days:  No results found.  Assessment/Plan  1. Pressure injury of left buttock, stage 2 ([place]) This area is difficult to heal due to his age, incontinence, and continued pressure to the area.  Recommend Diflucan 150 mg x 1  Lie down 1 hr two times per day in the bed mid morning and mid afternoon to relieve pressure Add prostat AWC 1 oz per day   2. Continuous leakage of urine Contributes to his skin issues Keep clean and dry and use barrier cream   Family/ staff Communication: resident and nurse Jasmine   Labs/tests ordered:  NA

## 2020-12-28 ENCOUNTER — Non-Acute Institutional Stay (SKILLED_NURSING_FACILITY): Payer: Medicare Other | Admitting: Internal Medicine

## 2020-12-28 ENCOUNTER — Encounter: Payer: Self-pay | Admitting: Internal Medicine

## 2020-12-28 DIAGNOSIS — M170 Bilateral primary osteoarthritis of knee: Secondary | ICD-10-CM

## 2020-12-28 DIAGNOSIS — R001 Bradycardia, unspecified: Secondary | ICD-10-CM | POA: Diagnosis not present

## 2020-12-28 DIAGNOSIS — I480 Paroxysmal atrial fibrillation: Secondary | ICD-10-CM | POA: Diagnosis not present

## 2020-12-28 DIAGNOSIS — M79672 Pain in left foot: Secondary | ICD-10-CM | POA: Diagnosis not present

## 2020-12-28 DIAGNOSIS — N1832 Chronic kidney disease, stage 3b: Secondary | ICD-10-CM

## 2020-12-28 DIAGNOSIS — I509 Heart failure, unspecified: Secondary | ICD-10-CM

## 2020-12-28 DIAGNOSIS — E039 Hypothyroidism, unspecified: Secondary | ICD-10-CM | POA: Diagnosis not present

## 2020-12-28 NOTE — Progress Notes (Signed)
Location:    [facility] Room Number: [place] of Service:  SNF [phone]) Provider:  Veleta Miners MD  Virgie Dad, MD  Patient Care Team: Virgie Dad, MD as PCP - General (Internal Medicine) Community, Well [name], DMD as Consulting Physician (Oral Surgery)  Extended Emergency Contact Information Primary Emergency Contact: Fein,Adreyan Address: [place], [place] [address] [name] of [place] Phone: [phone] Mobile Phone: [phone] Relation: Son Secondary Emergency Contact: Saulsbury,[name] Address: [address]          [place], [place] [address] [name] of [place] Phone: [phone] Relation: Daughter  Code Status:  DNR Goals of care: Advanced Directive information Advanced Directives 12/28/2020  Does Patient Have a Medical Advance Directive? Yes  Type of Paramedic of [place];Living will;Out of facility DNR (pink MOST or yellow form)  Does patient want to make changes to medical advance directive? No - Patient declined  Copy of [place] in Chart? Yes - validated most recent copy scanned in chart (See row information)  Pre-existing out of facility DNR order (yellow form or pink MOST form) Yellow form placed in chart (order not valid for inpatient use)     Chief Complaint  Patient presents with  . Medical Management of Chronic Issues    HPI:  Pt is a 87 y.o. male seen today for medical management of chronic diseases.    Patient was admitted in SNF on 1/22 Has history of sick sinus syndrome.  Bradycardia History of PAF On Eliquis Chronic CHF CKD stage III, BPH Mild cognitive impairment, hypothyroid  Patient lives in SNF is dependent for his ADLs Weight seems to be stable appetite is good His main complaint today is lost left heel pain.  He states the pain is more when he is in the bed at night.  Always at night  he is.  He is not ambulatory. He had reported of this pain to Dr. Mariea Clonts before and she had diagnosed him with healed spur .  No other active complaints   Past Medical History:  Diagnosis Date  . Abnormality of gait 02/2010  . Anal and rectal polyp 1980  . Atrial fibrillation ([place]) 11/2009  . Chronic kidney disease, stage III (moderate) ([place]) 08/07/2013  . Deviated nasal septum 11/2009  . Diaphragmatic hernia without mention of obstruction or gangrene 2007  . Diarrhea 02/2010  . Dizziness and giddiness 2009  . Edema 2010  . Essential and other specified forms of tremor 2007  . 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 Hyperglycemia 01/02/2013  . Hypertrophy of prostate without urinary obstruction and other lower urinary tract symptoms (LUTS) 2002  . Insomnia with sleep apnea, unspecified 02/2010  . Irritable bowel syndrome 11/14/2011  . Long term (current) use of anticoagulants 11/2009  . Other abnormal blood chemistry 02/09/2009  . Other B-complex deficiencies 2008  . Other dyspnea and respiratory abnormality 11/2009  . Other malaise and fatigue 2007  . Pain in joint, lower leg 02/2010  . Palpitations 2007  . Reflux esophagitis 2007  . Sebaceous cyst 2008  . Tension headache 2009  . Undiagnosed cardiac murmurs 2002  . Unspecified constipation 07/04/2012  . Unspecified essential hypertension 2007  . Unspecified hereditary and idiopathic peripheral neuropathy 02/2010  . Unspecified transient cerebral ischemia 2002  . Urinary frequency 10/01/2012   No past surgical history on file.  Allergies  Allergen Reactions  . Plavix [Clopidogrel Bisulfate]     indigestion    Allergies as of 12/28/2020      Reactions   Plavix [clopidogrel Bisulfate]    indigestion      Medication List       Accurate as of December 28, 2020  3:10 PM. If you have any questions, ask your nurse or doctor.        Acetaminophen 500 MG capsule Take 2 capsules by mouth at bedtime. And as  needed   apixaban 2.5 MG Tabs tablet Commonly known as: Eliquis Take 1 tablet (2.5 mg total) by mouth 2 (two) times daily.   CeraVe Daily Moisturizing Lotn Apply topically. Apply to legs/feet before compression socks   doxazosin 2 MG tablet Commonly known as: Cardura Take 1 tablet (2 mg total) by mouth daily.   fluticasone 50 MCG/ACT nasal spray Commonly known as: FLONASE Place 2 sprays into both nostrils 2 (two) times daily.   furosemide 40 MG tablet Commonly known as: LASIX Take 40 mg by mouth daily.   Lactase 9000 units Chew chew 1 tab (9,000 u) by mouth w/ first bite of milk product PRN lactose intolerant.   Levothyroxine Sodium 50 MCG Caps Take 1 capsule (50 mcg total) by mouth daily before breakfast.   Melatonin 3 MG Caps Take 1 capsule (3 mg total) by mouth at bedtime.   metoprolol tartrate 25 MG tablet Commonly known as: LOPRESSOR Take 0.5 tablets (12.5 mg total) by mouth daily as needed (HR>/= 110bpm).   mupirocin ointment 2 % Commonly known as: BACTROBAN Apply 1 application topically 2 (two) times daily. Apply to left buttock skin tag PRN no more than 2 times a day until healed   OcuSoft Eyelid Cleansing Pads Apply topically daily. In the morning and as needed through out the day for matted eyes   omeprazole 20 MG tablet Commonly known as: PRILOSEC OTC Take 20 mg by mouth daily.   ondansetron 4 MG tablet Commonly known as: ZOFRAN Take 4 mg by mouth every 8 (eight) hours as needed for nausea or vomiting.   Polyvinyl Alcohol-Povidone PF 1.4-0.6 % Soln Apply 1.4 % to eye 2 (two) times daily.   potassium chloride 20 MEQ/15ML (10%) Soln Take 15 mLs by mouth daily.   PROSTAT PO Take 1 oz by mouth daily.   trolamine salicylate 10 % cream Commonly known as: ASPERCREME Apply 1 application topically 2 (two) times daily as needed.   vitamin B-12 1000 MCG tablet Commonly known as: CYANOCOBALAMIN Take 1,000 mcg by mouth daily.       Review of Systems   Constitutional: Positive for activity change.  HENT: Negative.   Respiratory: Negative.   Cardiovascular: Positive for leg swelling.  Gastrointestinal: Negative.   Genitourinary: Negative.   Musculoskeletal: Positive for arthralgias and gait problem.  Skin: Negative.   Neurological: Negative for dizziness.  Psychiatric/Behavioral: Negative.     Immunization History  Administered Date(s) Administered  . Influenza Inj Mdck Quad Pf 06/23/2016  . Influenza, High Dose Seasonal PF 07/04/2019, 06/26/2020, 07/03/2020  . Influenza,inj,Quad PF,6+ Mos 07/05/2018  . Influenza-Unspecified 06/11/2013, 06/23/2014, 06/25/2015, 06/29/2017, 06/26/2020  . Moderna SARS-COV2 Booster Vaccination 07/18/2020  . Moderna Sars-Covid-2 Vaccination 09/17/2019, 10/30/2019, 07/18/2020  . Pneumococcal Conjugate-13 09/09/2015  . Pneumococcal Polysaccharide-23 09/05/2008  . Td 09/05/1978  . Tdap 02/23/2017  . Zoster Recombinat (Shingrix) 10/02/2017, 03/01/2018   Pertinent  Health Maintenance Due  Topic Date Due  . INFLUENZA VACCINE  04/05/2021  . PNA  vac Low Risk Adult  Completed   Fall Risk  04/19/2020 04/15/2020 04/15/2020 12/11/2019 07/31/2019  Falls in the past year? - 0 0 0 0  Comment - - - - Emmi Telephone Survey: data to providers prior to load  Number falls in past yr: - 0 0 0 -  Injury with Fall? - 0 0 0 -  Risk for fall due to : History of fall(s);Impaired balance/gait;Impaired mobility - - - -  Follow up Falls prevention discussed;Education provided;Falls evaluation completed - - - -   Functional Status Survey:    Vitals:   12/28/20 1504  BP: (!) 146/67  Pulse: 60  Resp: (!) 22  Temp: 97.7 F (36.5 C)  SpO2: 93%  Weight: 187 lb 1.6 oz (84.9 kg)  Height: 5\' 9"  (1.753 m)   Body mass index is 27.63 kg/m. Physical Exam Vitals reviewed.  Constitutional:      Appearance: Normal appearance.  HENT:     Head: Normocephalic.     Nose: Nose normal.     Mouth/Throat:     Mouth: Mucous  membranes are moist.     Pharynx: Oropharynx is clear.  Eyes:     Pupils: Pupils are equal, round, and reactive to light.  Cardiovascular:     Rate and Rhythm: Bradycardia present. Rhythm irregular.     Pulses: Normal pulses.  Pulmonary:     Effort: Pulmonary effort is normal.  Abdominal:     General: Abdomen is flat. Bowel sounds are normal.     Palpations: Abdomen is soft.  Musculoskeletal:     Cervical back: Neck supple.     Comments: Mild Swelling Bilateral No Open Lesions in Heel Does have Feeble Pulses Bilateral with Color changes in Both Legs  Skin:    General: Skin is warm.  Neurological:     General: No focal deficit present.     Mental Status: He is alert and oriented to person, place, and time.  Psychiatric:        Mood and Affect: Mood normal.        Thought Content: Thought content normal.     Labs reviewed: Recent Labs    06/11/20 0000 09/13/20 0000  NA 144 141  K 4.4 3.7  CL 105 104  CO2 30* 26*  BUN 40* 37*  CREATININE 1.8* 1.7*  CALCIUM 9.0 8.5*   No results for input(s): AST, ALT, ALKPHOS, BILITOT, PROT, ALBUMIN in the last 8760 hours. Recent Labs    09/13/20 0000 09/14/20 0000  WBC 12.1 7.8  NEUTROABS 10.50  --   HGB 12.8* 12.4*  HCT 39* 36*  PLT 146* 119*   Lab Results  Component Value Date   TSH 2.59 10/09/2019   Lab Results  Component Value Date   HGBA1C 5.3 03/01/2016   Lab Results  Component Value Date   CHOL 119 03/01/2016   HDL 56 03/01/2016   LDLCALC 52 03/01/2016   TRIG 54 03/01/2016    Significant Diagnostic Results in last 30 days:  No results found.  Assessment/Plan Pain of left heel ? Etiology Most Likely PAD as patient says Pain in when he raises his leg at night Will try Meloxicam for 5 days with Food for pain Control Already on Tylenol at night Don't know How ABI will help at his age. Will Wait for now   Paroxysmal atrial fibrillation (HCC) On Eliquis and Low doe of Metoprolol Chronic congestive heart  failure, unspecified heart failure type (HCC) Stable on Lasix Stage  3b chronic kidney disease (HCC) Creat stable Bilateral primary osteoarthritis of knee Tylenol PRN Acquired hypothyroidism TSh normal in 2/21 Bradycardia Supportive care         Family/ staff Communication:   Labs/tests ordered:

## 2021-01-18 ENCOUNTER — Non-Acute Institutional Stay (SKILLED_NURSING_FACILITY): Payer: Medicare Other | Admitting: Adult Health

## 2021-01-18 ENCOUNTER — Encounter: Payer: Self-pay | Admitting: Adult Health

## 2021-01-18 DIAGNOSIS — B370 Candidal stomatitis: Secondary | ICD-10-CM | POA: Diagnosis not present

## 2021-01-18 DIAGNOSIS — M79672 Pain in left foot: Secondary | ICD-10-CM | POA: Diagnosis not present

## 2021-01-18 NOTE — Progress Notes (Signed)
Location:   [facility] Room Number: 143-A Place of Service:  SNF [id number]) Provider:  Royal Hawthorn, NP    Patient Care Team: Virgie Dad, MD as PCP - General (Internal Medicine) Community, Well [name], DMD as Consulting Physician (Oral Surgery)  Extended Emergency Contact Information Primary Emergency Contact: Magee,Tykee Address: [place], [place] [address] [name] of [place] Phone: [phone] Mobile Phone: [phone] Relation: Son Secondary Emergency Contact: Veach,[name] Address: [address]          [place], [place] [address] [name] of [place] Phone: [phone] Relation: Daughter  Code Status:  DNR Goals of care: Advanced Directive information Advanced Directives 01/18/2021  Does Patient Have a Medical Advance Directive? Yes  Type of Paramedic of [place];Out of facility DNR (pink MOST or yellow form)  Does patient want to make changes to medical advance directive? No - Patient declined  Copy of [place] in Chart? Yes - validated most recent copy scanned in chart (See row information)  Pre-existing out of facility DNR order (yellow form or pink MOST form) -     Chief Complaint  Patient presents with  . Acute Visit    Fever     HPI:  Pt is a 87 y.o. male seen today for an acute visit for fever and and nausea. Mr. Akamine had a temp of 100 and felt nauseated on 5/15.  He did not have a cough, sore throat, body aches, sob, abd pain, diarrhea or other symptoms. He feels back to baseline today. He has not had any sick contacts. He felt it was transient and is now back to baseline. He states his left heel still hurts and burns around 3 am. He tried Mobic in march and can't remember if it helped. He could have PAD but given his age of 26 and goals of care no further studies were ordered. He is not ambulatory and so he does  not have claudication. He also feels that there is something in his mouth like a thick coating. Did have some trouble getting his pills down yesterday.   Past Medical History:  Diagnosis Date  . Abnormality of gait 02/2010  . Anal and rectal polyp 1980  . Atrial fibrillation ([employer]) 11/2009  . Chronic kidney disease, stage III (moderate) ([facility]) 08/07/2013  . Deviated nasal septum 11/2009  . Diaphragmatic hernia without mention of obstruction or gangrene 2007  . Diarrhea 02/2010  . Dizziness and giddiness 2009  . Edema 2010  . Essential and other specified forms of tremor 2007  . 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 Hyperglycemia 01/02/2013  . Hypertrophy of prostate without urinary obstruction and other lower urinary tract symptoms (LUTS) 2002  . Insomnia with sleep apnea, unspecified 02/2010  . Irritable bowel syndrome 11/14/2011  . Long term (current) use of anticoagulants 11/2009  . Other abnormal blood chemistry 02/09/2009  . Other B-complex deficiencies 2008  . Other dyspnea and respiratory abnormality 11/2009  . Other malaise and fatigue 2007  . Pain in joint, lower leg 02/2010  . Palpitations 2007  . Reflux esophagitis 2007  . Sebaceous cyst 2008  . Tension headache 2009  . Undiagnosed cardiac murmurs 2002  . Unspecified constipation 07/04/2012  . Unspecified essential hypertension 2007  . Unspecified hereditary and idiopathic peripheral neuropathy 02/2010  . Unspecified transient cerebral ischemia 2002  . Urinary  frequency 10/01/2012   History reviewed. No pertinent surgical history.  Allergies  Allergen Reactions  . Other     Peppers.  . Plavix [Clopidogrel Bisulfate]     indigestion    Allergies as of 01/18/2021      Reactions   Other    Peppers.   Plavix [clopidogrel Bisulfate]    indigestion      Medication List       Accurate as of Jan 18, 2021 10:25 AM. If you have any questions, ask your nurse or doctor.        Acetaminophen 500 MG  capsule Take 2 capsules by mouth at bedtime. And as needed   apixaban 2.5 MG Tabs tablet Commonly known as: Eliquis Take 1 tablet (2.5 mg total) by mouth 2 (two) times daily.   CeraVe Daily Moisturizing Lotn Apply topically. Apply to legs/feet before compression socks   doxazosin 2 MG tablet Commonly known as: Cardura Take 1 tablet (2 mg total) by mouth daily.   fluticasone 50 MCG/ACT nasal spray Commonly known as: FLONASE Place 2 sprays into both nostrils 2 (two) times daily.   furosemide 40 MG tablet Commonly known as: LASIX Take 40 mg by mouth daily.   Lactase 9000 units Chew chew 1 tab (9,000 u) by mouth w/ first bite of milk product PRN lactose intolerant.   Levothyroxine Sodium 50 MCG Caps Take 1 capsule (50 mcg total) by mouth daily before breakfast.   Melatonin 3 MG Caps Take 1 capsule (3 mg total) by mouth at bedtime.   metoprolol tartrate 25 MG tablet Commonly known as: LOPRESSOR Take 0.5 tablets (12.5 mg total) by mouth daily as needed (HR>/= 110bpm).   mupirocin ointment 2 % Commonly known as: BACTROBAN Apply 1 application topically 2 (two) times daily. Apply to left buttock skin tag PRN no more than 2 times a day until healed   OcuSoft Eyelid Cleansing Pads Apply topically daily. In the morning and as needed through out the day for matted eyes   omeprazole 20 MG tablet Commonly known as: PRILOSEC OTC Take 20 mg by mouth daily.   ondansetron 4 MG tablet Commonly known as: ZOFRAN Take 4 mg by mouth every 8 (eight) hours as needed for nausea or vomiting.   Polyvinyl Alcohol-Povidone PF 1.4-0.6 % Soln Apply 1.4 % to eye 2 (two) times daily.   potassium chloride 20 MEQ/15ML (10%) Soln Take 15 mLs by mouth daily.   PROSTAT PO Take 1 oz by mouth daily.   trolamine salicylate 10 % cream Commonly known as: ASPERCREME Apply 1 application topically 2 (two) times daily as needed.   vitamin B-12 1000 MCG tablet Commonly known as: CYANOCOBALAMIN Take  1,000 mcg by mouth daily.       Review of Systems  Constitutional: Negative for activity change, appetite change, chills, diaphoresis, fatigue, fever (resolved) and unexpected weight change.  HENT: Positive for trouble swallowing (due to coating in mouth ).   Respiratory: Negative for cough, shortness of breath, wheezing and stridor.   Cardiovascular: Negative for chest pain, palpitations and leg swelling.  Gastrointestinal: Positive for nausea (resolved). Negative for abdominal distention, abdominal pain, constipation and diarrhea.  Genitourinary: Negative for difficulty urinating and dysuria.  Musculoskeletal: Positive for gait problem. Negative for arthralgias, back pain, joint swelling and myalgias.       Left heel pain  Neurological: Negative for dizziness, seizures, syncope, facial asymmetry, speech difficulty, weakness and headaches.  Hematological: Negative for adenopathy. Does not bruise/bleed easily.  Psychiatric/Behavioral: Negative  for agitation, behavioral problems and confusion.    Immunization History  Administered Date(s) Administered  . Influenza Inj Mdck Quad Pf 06/23/2016  . Influenza, High Dose Seasonal PF 07/04/2019, 06/26/2020, 07/03/2020  . Influenza,inj,Quad PF,6+ Mos 07/05/2018  . Influenza-Unspecified 06/11/2013, 06/23/2014, 06/25/2015, 06/29/2017, 06/26/2020  . Moderna SARS-COV2 Booster Vaccination 07/18/2020  . Moderna Sars-Covid-2 Vaccination 09/17/2019, 10/30/2019, 07/18/2020  . Pneumococcal Conjugate-13 09/09/2015  . Pneumococcal Polysaccharide-23 09/05/2008  . Td 09/05/1978  . Tdap 02/23/2017  . Zoster Recombinat (Shingrix) 10/02/2017, 03/01/2018   Pertinent  Health Maintenance Due  Topic Date Due  . INFLUENZA VACCINE  04/05/2021  . PNA vac Low Risk Adult  Completed   Fall Risk  04/19/2020 04/15/2020 04/15/2020 12/11/2019 07/31/2019  Falls in the past year? - 0 0 0 0  Comment - - - - Emmi Telephone Survey: data to providers prior to load  Number  falls in past yr: - 0 0 0 -  Injury with Fall? - 0 0 0 -  Risk for fall due to : History of fall(s);Impaired balance/gait;Impaired mobility - - - -  Follow up Falls prevention discussed;Education provided;Falls evaluation completed - - - -   Functional Status Survey:    Vitals:   01/18/21 1018  BP: 103/62  Pulse: (!) 53  Resp: 20  Temp: 98.4 F (36.9 C)  SpO2: 91%  Weight: 188 lb 9.6 oz (85.5 kg)  Height: 5\' 9"  (1.753 m)   Body mass index is 27.85 kg/m. Physical Exam Vitals and nursing note reviewed.  HENT:     Ears:     Comments: Hearing loss    Nose: Nose normal. No congestion.     Mouth/Throat:     Mouth: Mucous membranes are moist.     Pharynx: Oropharynx is clear.     Comments: Thick white coating to tongue  Eyes:     Conjunctiva/sclera: Conjunctivae normal.     Pupils: Pupils are equal, round, and reactive to light.  Cardiovascular:     Rate and Rhythm: Normal rate. Rhythm irregular.     Heart sounds: No murmur heard.   Pulmonary:     Breath sounds: Normal breath sounds.  Abdominal:     General: Bowel sounds are normal. There is no distension.     Palpations: Abdomen is soft.     Tenderness: There is no abdominal tenderness. There is no right CVA tenderness, left CVA tenderness or guarding.  Musculoskeletal:     Cervical back: No rigidity or tenderness.     Right lower leg: No edema.     Left lower leg: No edema.  Lymphadenopathy:     Cervical: No cervical adenopathy.  Skin:    General: Skin is dry.  Neurological:     General: No focal deficit present.     Mental Status: He is oriented to person, place, and time. Mental status is at baseline.  Psychiatric:        Mood and Affect: Mood normal.     Labs reviewed: Recent Labs    06/11/20 0000 09/13/20 0000  NA 144 141  K 4.4 3.7  CL 105 104  CO2 30* 26*  BUN 40* 37*  CREATININE 1.8* 1.7*  CALCIUM 9.0 8.5*   No results for input(s): AST, ALT, ALKPHOS, BILITOT, PROT, ALBUMIN in the last 8760  hours. Recent Labs    09/13/20 0000 09/14/20 0000  WBC 12.1 7.8  NEUTROABS 10.50  --   HGB 12.8* 12.4*  HCT 39* 36*  PLT 146* 119*  Lab Results  Component Value Date   TSH 2.59 10/09/2019   Lab Results  Component Value Date   HGBA1C 5.3 03/01/2016   Lab Results  Component Value Date   CHOL 119 03/01/2016   HDL 56 03/01/2016   LDLCALC 52 03/01/2016   TRIG 54 03/01/2016    Significant Diagnostic Results in last 30 days:  No results found.  Assessment/Plan  1. Pain of left heel Neurontin 100 mg qhs, if no improvement after 1 week may increase to 200 mg qhs  Trying neurontin rather than mobic at this time since he is having some GI upset.   2. Thrush Nystatin mouthwash tid x 7 days   Acute symptoms have resolved, if reoccurs would obtain covid swab. Pt is vaccinated x 3.   Family/ staff Communication: resident and his nurse   Labs/tests ordered:  NA

## 2021-01-28 ENCOUNTER — Encounter: Payer: Self-pay | Admitting: Adult Health

## 2021-01-28 ENCOUNTER — Non-Acute Institutional Stay (SKILLED_NURSING_FACILITY): Payer: Medicare Other | Admitting: Adult Health

## 2021-01-28 DIAGNOSIS — I359 Nonrheumatic aortic valve disorder, unspecified: Secondary | ICD-10-CM | POA: Diagnosis not present

## 2021-01-28 DIAGNOSIS — L89322 Pressure ulcer of left buttock, stage 2: Secondary | ICD-10-CM | POA: Diagnosis not present

## 2021-01-28 DIAGNOSIS — I495 Sick sinus syndrome: Secondary | ICD-10-CM

## 2021-01-28 DIAGNOSIS — N1832 Chronic kidney disease, stage 3b: Secondary | ICD-10-CM

## 2021-01-28 DIAGNOSIS — M79672 Pain in left foot: Secondary | ICD-10-CM | POA: Diagnosis not present

## 2021-01-28 DIAGNOSIS — I509 Heart failure, unspecified: Secondary | ICD-10-CM

## 2021-01-28 DIAGNOSIS — I480 Paroxysmal atrial fibrillation: Secondary | ICD-10-CM

## 2021-01-28 NOTE — Progress Notes (Signed)
Location:  [facility] Room Number: 143-A Place of Service:  SNF [phone]) Provider:  Royal Hawthorn, NP   Patient Care Team: Virgie Dad, MD as PCP - General (Internal Medicine) Community, Well [name], DMD as Consulting Physician (Oral Surgery)  Extended Emergency Contact Information Primary Emergency Contact: Penman,Aven Address: [place], [place] [address] [name] of [place] Phone: [phone] Mobile Phone: [phone] Relation: Son Secondary Emergency Contact: Sarti,[name] Address: [address]          [place], [place] [address] [name] of [place] Phone: [phone] Relation: Daughter  Code Status: DNR Goals of care: Advanced Directive information Advanced Directives 01/28/2021  Does Patient Have a Medical Advance Directive? Yes  Type of Paramedic of [place];Living will;Out of facility DNR (pink MOST or yellow form)  Does patient want to make changes to medical advance directive? No - Patient declined  Copy of [place] in Chart? Yes - validated most recent copy scanned in chart (See row information)  Pre-existing out of facility DNR order (yellow form or pink MOST form) Yellow form placed in chart (order not valid for inpatient use)     Chief Complaint  Patient presents with  . Medical Management of Chronic Issues    Routine Visit and discuss need for 2nd covid booster or exclude     HPI:  Pt is a 87 y.o. male seen today for medical management of chronic diseases.    He was started on neurontin at bed time for left heel possibly related to PAD and/or neuropathy. He found some relief but still has nights where the pain wakes him up. He is not ambulatory and has no ulcerations. The nurse is going to try heel protectors at night as well she reports.   He is not having any issues with sob or chest pain. Noted hx of afib  with palpitations, along with SSS.  HR runs in the 40s at times. NO periods of syncope or dizziness. Due to his age he is not a candidate for a pacemaker.  Has a hx of aortic valve disorder. EF normal on echo in 2019 but the study was technically difficulty so they could not assess the degree of AS. Study notes calcified and restricted aortic valve.   Also he had a stage 2 pressure area on his buttocks which is healing with pressure relief and barrier cream Past Medical History:  Diagnosis Date  . Abnormality of gait 02/2010  . Anal and rectal polyp 1980  . Atrial fibrillation ([place]) 11/2009  . Chronic kidney disease, stage III (moderate) ([place]) 08/07/2013  . Deviated nasal septum 11/2009  . Diaphragmatic hernia without mention of obstruction or gangrene 2007  . Diarrhea 02/2010  . Dizziness and giddiness 2009  . Edema 2010  . Essential and other specified forms of tremor 2007  . 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 Hyperglycemia 01/02/2013  . Hypertrophy of prostate without urinary obstruction and other lower urinary tract symptoms (LUTS) 2002  . Insomnia with sleep apnea, unspecified 02/2010  . Irritable bowel syndrome 11/14/2011  . Long term (current) use of anticoagulants 11/2009  . Other abnormal blood chemistry 02/09/2009  . Other B-complex deficiencies 2008  . Other dyspnea and respiratory abnormality 11/2009  . Other malaise and fatigue 2007  . Pain in joint, lower leg 02/2010  . Palpitations 2007  . Reflux  esophagitis 2007  . Sebaceous cyst 2008  . Tension headache 2009  . Undiagnosed cardiac murmurs 2002  . Unspecified constipation 07/04/2012  . Unspecified essential hypertension 2007  . Unspecified hereditary and idiopathic peripheral neuropathy 02/2010  . Unspecified transient cerebral ischemia 2002  . Urinary frequency 10/01/2012   History reviewed. No pertinent surgical history.  Allergies  Allergen Reactions  . Other     Peppers.  . Plavix  [Clopidogrel Bisulfate]     indigestion    Outpatient Encounter Medications as of 01/28/2021  Medication Sig  . Acetaminophen 500 MG coapsule Take 1 capsule by mouth at bedtime. And 2 by mouth as needed through out the day for pain  . apixaban (ELIQUIS) 2.5 MG TABS tablet Take 1 tablet (2.5 mg total) by mouth 2 (two) times daily.  Marland Kitchen doxazosin (CARDURA) 2 MG tablet Take 1 tablet (2 mg total) by mouth daily.  . Emollient (CERAVE DAILY MOISTURIZING) LOTN Apply topically. Apply to legs/feet before compression socks  . Eyelid Cleansers (OCUSOFT EYELID CLEANSING) PADS Apply 1 application topically as needed (In the morning and as needed through out the day for matted eyes).  . fluticasone (FLONASE) 50 MCG/ACT nasal spray Place 2 sprays into both nostrils 2 (two) times daily.  . furosemide (LASIX) 40 MG tablet Take 40 mg by mouth daily.   Marland Kitchen gabapentin (NEURONTIN) 100 MG capsule Take 100 mg by mouth at bedtime. 100 mg qhs if no improvement after 1 week, increase to 200 mg  . Lactase 9000 units CHEW chew 1 tab (9,000 u) by mouth w/ first bite of milk product PRN lactose intolerant.  . Levothyroxine Sodium 50 MCG CAPS Take 1 capsule (50 mcg total) by mouth daily before breakfast.  . Melatonin 3 MG CAPS Take 1 capsule (3 mg total) by mouth at bedtime.  . metoprolol tartrate (LOPRESSOR) 25 MG tablet Take 0.5 tablets (12.5 mg total) by mouth daily as needed (HR>/= 110bpm).  . mupirocin ointment (BACTROBAN) 2 % Apply 1 application topically as needed. Apply to left buttock skin tag, no more than 2 times a day until healed  . omeprazole (PRILOSEC OTC) 20 MG tablet Take 20 mg by mouth daily.  . ondansetron (ZOFRAN) 4 MG tablet Take 4 mg by mouth every 8 (eight) hours as needed for nausea or vomiting.  . Polyvinyl Alcohol-Povidone PF 1.4-0.6 % SOLN Apply 1.4 % to eye 2 (two) times daily.   . potassium chloride 20 MEQ/15ML (10%) SOLN Take 15 mLs by mouth daily.   Marland Kitchen trolamine salicylate (ASPERCREME) 10 % cream  Apply 1 application topically 2 (two) times daily as needed.  . vitamin B-12 (CYANOCOBALAMIN) 1000 MCG tablet Take 1,000 mcg by mouth daily.  . [DISCONTINUED] Pollen Extracts (PROSTAT PO) Take 1 oz by mouth daily.   No facility-administered encounter medications on file as of 01/28/2021.    Review of Systems  Constitutional: Negative for activity change, appetite change, chills, diaphoresis, fatigue, fever and unexpected weight change.  Respiratory: Negative for cough, shortness of breath, wheezing and stridor.   Cardiovascular: Positive for leg swelling. Negative for chest pain and palpitations (occasionally).  Gastrointestinal: Negative for abdominal distention, abdominal pain, constipation and diarrhea.  Genitourinary: Negative for difficulty urinating and dysuria.  Musculoskeletal: Positive for gait problem. Negative for arthralgias, back pain, joint swelling and myalgias.       Left heel pain  Skin: Positive for wound (improved).  Neurological: Positive for weakness. Negative for dizziness, seizures, syncope, facial asymmetry, speech difficulty and headaches.  Hematological: Negative for adenopathy. Does not bruise/bleed easily.  Psychiatric/Behavioral: Negative for agitation, behavioral problems and confusion.    Immunization History  Administered Date(s) Administered  . Influenza Inj Mdck Quad Pf 06/23/2016  . Influenza, High Dose Seasonal PF 07/04/2019, 06/26/2020, 07/03/2020  . Influenza,inj,Quad PF,6+ Mos 07/05/2018  . Influenza-Unspecified 06/11/2013, 06/23/2014, 06/25/2015, 06/29/2017, 06/26/2020  . Moderna SARS-COV2 Booster Vaccination 07/18/2020  . Moderna Sars-Covid-2 Vaccination 09/17/2019, 10/30/2019, 07/18/2020  . Pneumococcal Conjugate-13 09/09/2015  . Pneumococcal Polysaccharide-23 09/05/2008  . Td 09/05/1978  . Tdap 02/23/2017  . Zoster Recombinat (Shingrix) 10/02/2017, 03/01/2018   Pertinent  Health Maintenance Due  Topic Date Due  . INFLUENZA VACCINE   04/05/2021  . PNA vac Low Risk Adult  Completed   Fall Risk  04/19/2020 04/15/2020 04/15/2020 12/11/2019 07/31/2019  Falls in the past year? - 0 0 0 0  Comment - - - - Emmi Telephone Survey: data to providers prior to load  Number falls in past yr: - 0 0 0 -  Injury with Fall? - 0 0 0 -  Risk for fall due to : History of fall(s);Impaired balance/gait;Impaired mobility - - - -  Follow up Falls prevention discussed;Education provided;Falls evaluation completed - - - -   Functional Status Survey:    Vitals:   01/28/21 0950  BP: (!) 119/54  Pulse: (!) 51  Resp: 16  Temp: 97.7 F (36.5 C)  SpO2: 93%  Weight: 184 lb (83.5 kg)  Height: 5\' 9"  (1.753 m)   Body mass index is 27.17 kg/m. Physical Exam Vitals and nursing note reviewed.  Constitutional:      Appearance: Normal appearance.  Cardiovascular:     Rate and Rhythm: Rhythm irregular.     Pulses:          Dorsalis pedis pulses are 2+ on the right side and 0 on the left side.  Pulmonary:     Breath sounds: Normal breath sounds.  Abdominal:     General: Bowel sounds are normal. There is no distension.     Palpations: Abdomen is soft.     Tenderness: There is no abdominal tenderness.  Musculoskeletal:     Right lower leg: Edema (+1) present.     Left lower leg: Edema (+1) present.     Right foot: Normal range of motion. No foot drop.     Left foot: Normal range of motion. No foot drop.  Feet:     Right foot:     Skin integrity: Skin integrity normal.     Left foot:     Skin integrity: Skin integrity normal.  Skin:    General: Skin is warm and dry.     Findings: Erythema (sacral area) present.  Neurological:     General: No focal deficit present.     Mental Status: He is alert and oriented to person, place, and time.  Psychiatric:        Mood and Affect: Mood normal.     Labs reviewed: Recent Labs    06/11/20 0000 09/13/20 0000  NA 144 141  K 4.4 3.7  CL 105 104  CO2 30* 26*  BUN 40* 37*  CREATININE 1.8*  1.7*  CALCIUM 9.0 8.5*   No results for input(s): AST, ALT, ALKPHOS, BILITOT, PROT, ALBUMIN in the last 8760 hours. Recent Labs    09/13/20 0000 09/14/20 0000  WBC 12.1 7.8  NEUTROABS 10.50  --   HGB 12.8* 12.4*  HCT 39* 36*  PLT 146* 119*   Lab  Results  Component Value Date   TSH 2.59 10/09/2019   Lab Results  Component Value Date   HGBA1C 5.3 03/01/2016   Lab Results  Component Value Date   CHOL 119 03/01/2016   HDL 56 03/01/2016   LDLCALC 52 03/01/2016   TRIG 54 03/01/2016    Significant Diagnostic Results in last 30 days:  No results found.  Assessment/Plan  1. Pain of left heel Possibly due to PAD Increase neurontin to 200 mg qhs  2. Pressure injury of left buttock, stage 2 (HCC) Improved as the area has closed but has some erythema  continue barrier cream and pressure relief   3. Sick sinus syndrome (HCC) Has bradycardia but no current symptoms Goals of care are comfort based at 87 y.o.  4. Chronic congestive heart failure, unspecified heart failure type ([place]) His weight is trending down Continue Lasix 40 mg qd   5. Paroxysmal atrial fibrillation (HCC) Rate is controlled.  Continues on Eliquis for CVA risk reduction   6. Stage 3b chronic kidney disease ([place]) Continue to periodically monitor BMP and avoid nephrotoxic agents  7. Aortic valve disorder Monitoring of symptoms only due to age  Family/ staff Communication: resident   Labs/tests ordered: BMP next month

## 2021-01-31 ENCOUNTER — Telehealth: Payer: Self-pay | Admitting: Adult Health

## 2021-01-31 DIAGNOSIS — Z20828 Contact with and (suspected) exposure to other viral communicable diseases: Secondary | ICD-10-CM | POA: Diagnosis not present

## 2021-01-31 DIAGNOSIS — Z9189 Other specified personal risk factors, not elsewhere classified: Secondary | ICD-10-CM | POA: Diagnosis not present

## 2021-01-31 NOTE — Telephone Encounter (Signed)
Nurse called to report Gary [name] has nasal congestion and cough. He was not feeling well and a rapid covid test was completed and returned positive. He was placed on isolation.  I asked the nurse to write an order for pharmacy to dose paxlovid for this resident. Last Cr 1.7, will await input from pharmacy.

## 2021-01-31 NOTE — Telephone Encounter (Signed)
PCR ordered and sent

## 2021-02-16 ENCOUNTER — Non-Acute Institutional Stay (SKILLED_NURSING_FACILITY): Payer: Medicare Other | Admitting: Orthopedic Surgery

## 2021-02-16 ENCOUNTER — Encounter: Payer: Self-pay | Admitting: Orthopedic Surgery

## 2021-02-16 DIAGNOSIS — R400 Somnolence: Secondary | ICD-10-CM | POA: Diagnosis not present

## 2021-02-16 DIAGNOSIS — R112 Nausea with vomiting, unspecified: Secondary | ICD-10-CM

## 2021-02-16 DIAGNOSIS — G3184 Mild cognitive impairment, so stated: Secondary | ICD-10-CM

## 2021-02-16 NOTE — Progress Notes (Signed)
Location:  [facility] Room Number: 143-A Place of Service:  SNF [phone]) Provider:  Windell Moulding, NP    Patient Care Team: Gary Dad, MD as PCP - General (Internal Medicine) Community, Well Gary [name], DMD as Consulting Physician (Oral Surgery)  Extended Emergency Contact Information Primary Emergency Contact: [name],Gary Address: [place], [place] [address] [name] Phone: [phone] Mobile Phone: [phone] Relation: Son Secondary Emergency Contact: [name],Gary Address: [address]          [place], [place] [address] [name] Phone: [phone] Relation: Daughter  Code Status:  DNR Goals of care: Advanced Directive information Advanced Directives 02/16/2021  Does Patient Have a Medical Advance Directive? Yes  Type of Paramedic of [place];Living will;Out of facility DNR (pink MOST or yellow form)  Does patient want to make changes to medical advance directive? No - Patient declined  Copy of [place] in Chart? Yes - validated most recent copy scanned in chart (See row information)  Pre-existing out of facility DNR order (yellow form or pink MOST form) Yellow form placed in chart (order not valid for inpatient use)     Chief Complaint  Patient presents with   Acute Visit    Patient c/o nausea     HPI:  Pt is a 87 y.o. male seen today for an acute visit for nausea and right shoulder pain.   06/13 he began to have upset stomach after eating lunch. He began to c/o abdominal pain and vomited twice. He was given phenergan 25 mg once at 1630. Since phenergan administration, he has been sleeping. Nursing staff was unable to administer evening and morning medications. During our encounter, Gary [name] was able to open his eyes to voice and touch. He did not speak or follow commands during encounter.   Prior to nausea  Gary [name] had c/o right shoulder pain to nursing staff. Unable to obtain details of issue at this time.   Nurse does not report any other concerns, temp 99.9, other vitals stable.    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   Hyperglycemia 01/02/2013   Hypertrophy of prostate without urinary obstruction and other lower urinary tract symptoms (LUTS) 2002   Insomnia with sleep apnea, unspecified 02/2010   Irritable bowel syndrome 11/14/2011   Long term (current) use of anticoagulants 11/2009   Other abnormal blood chemistry 02/09/2009   Other B-complex deficiencies 2008   Other dyspnea and respiratory abnormality 11/2009   Other malaise and fatigue 2007   Pain in joint, lower leg 02/2010   Palpitations 2007   Reflux esophagitis 2007   Sebaceous cyst 2008   Tension headache 2009   Undiagnosed cardiac murmurs 2002   Unspecified constipation 07/04/2012   Unspecified essential hypertension 2007   Unspecified hereditary and idiopathic peripheral neuropathy 02/2010   Unspecified transient cerebral ischemia 2002   Urinary frequency 10/01/2012   History reviewed. No pertinent surgical  history.  Allergies  Allergen Reactions   Other     Peppers.   Plavix [Clopidogrel Bisulfate]     indigestion    Outpatient Encounter Medications as of 02/16/2021  Medication Sig   Acetaminophen 500 MG coapsule Take 1 capsule by mouth at bedtime. And 2 by mouth as needed through out the day for pain   apixaban (ELIQUIS) 2.5 MG TABS tablet Take 1 tablet (2.5 mg total) by mouth 2 (two)  times daily.   doxazosin (CARDURA) 2 MG tablet Take 1 tablet (2 mg total) by mouth daily.   Emollient (CERAVE DAILY MOISTURIZING) LOTN Apply topically. Apply to legs/feet before compression socks   Eyelid Cleansers (OCUSOFT EYELID CLEANSING) PADS Apply 1 application topically as directed.   fluticasone (FLONASE) 50 MCG/ACT nasal spray Place 2 sprays into both nostrils 2 (two) times daily.   furosemide (LASIX) 40 MG tablet Take 40 mg by mouth daily.    gabapentin (NEURONTIN) 100 MG capsule Take 200 mg by mouth at bedtime.   Lactase 9000 units CHEW chew 1 tab (9,000 u) by mouth w/ first bite of milk product PRN lactose intolerant.   Levothyroxine Sodium 50 MCG CAPS Take 1 capsule (50 mcg total) by mouth daily before breakfast.   Melatonin 3 MG CAPS Take 1 capsule (3 mg total) by mouth at bedtime.   metoprolol tartrate (LOPRESSOR) 12.5 mg TABS tablet Take 12.5 mg by mouth as needed (if Heart Rate is =/> 110).   mupirocin ointment (BACTROBAN) 2 % Apply 1 application topically as needed. Apply to left buttock skin tag, no more than 2 times a day until healed   omeprazole (PRILOSEC OTC) 20 MG tablet Take 20 mg by mouth daily.   ondansetron (ZOFRAN) 4 MG tablet Take 4 mg by mouth every 8 (eight) hours as needed for nausea or vomiting.   Polyvinyl Alcohol-Povidone PF 1.4-0.6 % SOLN Apply 1.4 % to eye 2 (two) times daily.    potassium chloride 20 MEQ/15ML (10%) SOLN Take 15 mLs by mouth daily.    promethazine (PHENERGAN) 25 MG tablet Take 25 mg by mouth every 6 (six) hours as needed for nausea or vomiting (If vomiting persists after 2 doses, notify MD/NP.).   trolamine salicylate (ASPERCREME) 10 % cream Apply 1 application topically 2 (two) times daily as needed.   vitamin B-12 (CYANOCOBALAMIN) 1000 MCG tablet Take 1,000 mcg by mouth daily.   [DISCONTINUED] metoprolol tartrate (LOPRESSOR) 25 MG tablet Take 0.5 tablets (12.5 mg total) by mouth daily as needed (HR>/= 110bpm).   No facility-administered  encounter medications on file as of 02/16/2021.    Review of Systems  Unable to perform ROS: Patient nonverbal   Immunization History  Administered Date(s) Administered   Influenza Inj Mdck Quad Pf 06/23/2016   Influenza, High Dose Seasonal PF 07/04/2019, 06/26/2020, 07/03/2020   Influenza,inj,Quad PF,6+ Mos 07/05/2018   Influenza-Unspecified 06/11/2013, 06/23/2014, 06/25/2015, 06/29/2017, 06/26/2020   Moderna SARS-COV2 Booster Vaccination 07/18/2020   Moderna Sars-Covid-2 Vaccination 09/17/2019, 10/30/2019   Pneumococcal Conjugate-13 09/09/2015   Pneumococcal Polysaccharide-23 09/05/2008   Td 09/05/1978   Tdap 02/23/2017   Zoster Recombinat (Shingrix) 10/02/2017, 03/01/2018   Pertinent  Health Maintenance Due  Topic Date Due   INFLUENZA VACCINE  04/05/2021   PNA vac Low Risk Adult  Completed   Fall Risk  04/19/2020 04/15/2020 04/15/2020 12/11/2019 07/31/2019  Falls in the past year? - 0 0 0 0  Comment - - - - Emmi Telephone Survey: data to providers prior to load  Number  falls in past yr: - 0 0 0 -  Injury with Fall? - 0 0 0 -  Risk for fall due to : History of fall(s);Impaired balance/gait;Impaired mobility - - - -  Follow up Falls prevention discussed;Education provided;Falls evaluation completed - - - -   Functional Status Survey:    Vitals:   02/16/21 1048  BP: 115/64  Pulse: 80  Resp: 20  Temp: 99.9 F (37.7 C)  SpO2: 90%  Weight: 182 lb 9.6 oz (82.8 kg)  Height: 5\' 9"  (1.753 m)   Body mass index is 26.97 kg/m. Physical Exam Vitals reviewed.  Constitutional:      General: He is sleeping.  HENT:     Head: Normocephalic.  Eyes:     Pupils: Pupils are equal, round, and reactive to light.     Comments: Pupils 3+  Cardiovascular:     Rate and Rhythm: Normal rate. Rhythm irregular.     Pulses: Normal pulses.     Heart sounds: Normal heart sounds. No murmur heard. Pulmonary:     Effort: Pulmonary effort is normal. No respiratory distress.     Breath sounds:  Normal breath sounds. No wheezing.  Abdominal:     General: Bowel sounds are normal. There is no distension.     Palpations: Abdomen is soft.     Tenderness: There is no abdominal tenderness.  Skin:    General: Skin is warm and dry.     Capillary Refill: Capillary refill takes less than 2 seconds.  Neurological:     General: No focal deficit present.     Mental Status: He is easily aroused.     Motor: Weakness present.     Gait: Gait abnormal.  Psychiatric:        Mood and Affect: Mood normal.        Behavior: Behavior normal.    Labs reviewed: Recent Labs    06/11/20 0000 09/13/20 0000  NA 144 141  K 4.4 3.7  CL 105 104  CO2 30* 26*  BUN 40* 37*  CREATININE 1.8* 1.7*  CALCIUM 9.0 8.5*   No results for input(s): AST, ALT, ALKPHOS, BILITOT, PROT, ALBUMIN in the last 8760 hours. Recent Labs    09/13/20 0000 09/14/20 0000  WBC 12.1 7.8  NEUTROABS 10.50  --   HGB 12.8* 12.4*  HCT 39* 36*  PLT 146* 119*   Lab Results  Component Value Date   TSH 2.59 10/09/2019   Lab Results  Component Value Date   HGBA1C 5.3 03/01/2016   Lab Results  Component Value Date   CHOL 119 03/01/2016   HDL 56 03/01/2016   LDLCALC 52 03/01/2016   TRIG 54 03/01/2016    Significant Diagnostic Results in last 30 days:  No results found.  Assessment/Plan 1. Somnolence - aroused to voice and touch - pupils 3+, PERRLA - he was given phenergan 25 mg once at 1630 - discontinue phenergan - start reglan 5 mg po qid prn x 2 weeks for nausea and vomiting  2. Non-intractable vomiting with nausea, unspecified vomiting type - see above - suspect possible food poisoning or virus- recent covid infection - temp 99.9  - cont prn tylenol - cbc/diff - cmp    Family/ staff Communication: plan discussed with nurse  Labs/tests ordered:  none

## 2021-02-18 ENCOUNTER — Inpatient Hospital Stay (HOSPITAL_COMMUNITY)
Admission: EM | Admit: 2021-02-18 | Discharge: 2021-02-23 | DRG: 445 | Disposition: A | Discharge status: Skilled Nursing Facility | Payer: Medicare Other | Source: Skilled Nursing Facility | Attending: Internal Medicine | Admitting: Internal Medicine

## 2021-02-18 ENCOUNTER — Encounter (HOSPITAL_COMMUNITY): Payer: Self-pay

## 2021-02-18 ENCOUNTER — Other Ambulatory Visit: Payer: Self-pay

## 2021-02-18 ENCOUNTER — Emergency Department (HOSPITAL_COMMUNITY): Payer: Medicare Other

## 2021-02-18 DIAGNOSIS — Z8616 Personal history of COVID-19: Secondary | ICD-10-CM | POA: Diagnosis not present

## 2021-02-18 DIAGNOSIS — E876 Hypokalemia: Secondary | ICD-10-CM | POA: Diagnosis present

## 2021-02-18 DIAGNOSIS — Z7901 Long term (current) use of anticoagulants: Secondary | ICD-10-CM

## 2021-02-18 DIAGNOSIS — E039 Hypothyroidism, unspecified: Secondary | ICD-10-CM | POA: Diagnosis present

## 2021-02-18 DIAGNOSIS — Z79899 Other long term (current) drug therapy: Secondary | ICD-10-CM

## 2021-02-18 DIAGNOSIS — Z833 Family history of diabetes mellitus: Secondary | ICD-10-CM

## 2021-02-18 DIAGNOSIS — N400 Enlarged prostate without lower urinary tract symptoms: Secondary | ICD-10-CM | POA: Diagnosis present

## 2021-02-18 DIAGNOSIS — Z7189 Other specified counseling: Secondary | ICD-10-CM

## 2021-02-18 DIAGNOSIS — L89159 Pressure ulcer of sacral region, unspecified stage: Secondary | ICD-10-CM | POA: Diagnosis present

## 2021-02-18 DIAGNOSIS — K838 Other specified diseases of biliary tract: Secondary | ICD-10-CM | POA: Diagnosis not present

## 2021-02-18 DIAGNOSIS — R531 Weakness: Secondary | ICD-10-CM | POA: Diagnosis not present

## 2021-02-18 DIAGNOSIS — N200 Calculus of kidney: Secondary | ICD-10-CM | POA: Diagnosis not present

## 2021-02-18 DIAGNOSIS — R9431 Abnormal electrocardiogram [ECG] [EKG]: Secondary | ICD-10-CM | POA: Diagnosis present

## 2021-02-18 DIAGNOSIS — N1832 Chronic kidney disease, stage 3b: Secondary | ICD-10-CM | POA: Diagnosis present

## 2021-02-18 DIAGNOSIS — U071 COVID-19: Secondary | ICD-10-CM | POA: Diagnosis not present

## 2021-02-18 DIAGNOSIS — K8050 Calculus of bile duct without cholangitis or cholecystitis without obstruction: Principal | ICD-10-CM | POA: Diagnosis present

## 2021-02-18 DIAGNOSIS — I482 Chronic atrial fibrillation, unspecified: Secondary | ICD-10-CM | POA: Diagnosis present

## 2021-02-18 DIAGNOSIS — Z66 Do not resuscitate: Secondary | ICD-10-CM | POA: Diagnosis present

## 2021-02-18 DIAGNOSIS — J189 Pneumonia, unspecified organism: Secondary | ICD-10-CM

## 2021-02-18 DIAGNOSIS — N3289 Other specified disorders of bladder: Secondary | ICD-10-CM | POA: Diagnosis not present

## 2021-02-18 DIAGNOSIS — I10 Essential (primary) hypertension: Secondary | ICD-10-CM | POA: Diagnosis present

## 2021-02-18 DIAGNOSIS — Z801 Family history of malignant neoplasm of trachea, bronchus and lung: Secondary | ICD-10-CM

## 2021-02-18 DIAGNOSIS — Z9049 Acquired absence of other specified parts of digestive tract: Secondary | ICD-10-CM

## 2021-02-18 DIAGNOSIS — G8101 Flaccid hemiplegia affecting right dominant side: Secondary | ICD-10-CM | POA: Diagnosis not present

## 2021-02-18 DIAGNOSIS — N179 Acute kidney failure, unspecified: Secondary | ICD-10-CM | POA: Diagnosis present

## 2021-02-18 DIAGNOSIS — Z8249 Family history of ischemic heart disease and other diseases of the circulatory system: Secondary | ICD-10-CM

## 2021-02-18 DIAGNOSIS — R17 Unspecified jaundice: Principal | ICD-10-CM | POA: Diagnosis present

## 2021-02-18 DIAGNOSIS — R001 Bradycardia, unspecified: Secondary | ICD-10-CM | POA: Diagnosis not present

## 2021-02-18 DIAGNOSIS — I959 Hypotension, unspecified: Secondary | ICD-10-CM | POA: Diagnosis not present

## 2021-02-18 DIAGNOSIS — R899 Unspecified abnormal finding in specimens from other organs, systems and tissues: Secondary | ICD-10-CM

## 2021-02-18 DIAGNOSIS — Z8673 Personal history of transient ischemic attack (TIA), and cerebral infarction without residual deficits: Secondary | ICD-10-CM

## 2021-02-18 DIAGNOSIS — Z993 Dependence on wheelchair: Secondary | ICD-10-CM

## 2021-02-18 DIAGNOSIS — K219 Gastro-esophageal reflux disease without esophagitis: Secondary | ICD-10-CM | POA: Diagnosis present

## 2021-02-18 DIAGNOSIS — F0390 Unspecified dementia without behavioral disturbance: Secondary | ICD-10-CM | POA: Diagnosis present

## 2021-02-18 DIAGNOSIS — I1310 Hypertensive heart and chronic kidney disease without heart failure, with stage 1 through stage 4 chronic kidney disease, or unspecified chronic kidney disease: Secondary | ICD-10-CM | POA: Diagnosis present

## 2021-02-18 LAB — CBC
HCT: 37 % — ABNORMAL LOW (ref 39.0–52.0)
Hemoglobin: 11.8 g/dL — ABNORMAL LOW (ref 13.0–17.0)
MCH: 31.5 pg (ref 26.0–34.0)
MCHC: 31.9 g/dL (ref 30.0–36.0)
MCV: 98.7 fL (ref 80.0–100.0)
Platelets: 145 10*3/uL — ABNORMAL LOW (ref 150–400)
RBC: 3.75 MIL/uL — ABNORMAL LOW (ref 4.22–5.81)
RDW: 13.7 % (ref 11.5–15.5)
WBC: 5.6 10*3/uL (ref 4.0–10.5)
nRBC: 0 % (ref 0.0–0.2)

## 2021-02-18 LAB — COMPREHENSIVE METABOLIC PANEL
ALT: 81 U/L — ABNORMAL HIGH (ref 0–44)
AST: 79 U/L — ABNORMAL HIGH (ref 15–41)
Albumin: 2.4 g/dL — ABNORMAL LOW (ref 3.5–5.0)
Alkaline Phosphatase: 220 U/L — ABNORMAL HIGH (ref 38–126)
Anion gap: 11 (ref 5–15)
BUN: 51 mg/dL — ABNORMAL HIGH (ref 8–23)
CO2: 27 mmol/L (ref 22–32)
Calcium: 8.1 mg/dL — ABNORMAL LOW (ref 8.9–10.3)
Chloride: 104 mmol/L (ref 98–111)
Creatinine, Ser: 2.2 mg/dL — ABNORMAL HIGH (ref 0.61–1.24)
GFR, Estimated: 26 mL/min — ABNORMAL LOW (ref >60)
Glucose, Bld: 108 mg/dL — ABNORMAL HIGH (ref 70–99)
Potassium: 3.8 mmol/L (ref 3.5–5.1)
Sodium: 142 mmol/L (ref 135–145)
Total Bilirubin: 6.2 mg/dL — ABNORMAL HIGH (ref 0.3–1.2)
Total Protein: 5.7 g/dL — ABNORMAL LOW (ref 6.5–8.1)

## 2021-02-18 LAB — LACTIC ACID, PLASMA: Lactic Acid, Venous: 1.2 mmol/L (ref 0.5–1.9)

## 2021-02-18 LAB — MAGNESIUM: Magnesium: 2.1 mg/dL (ref 1.7–2.4)

## 2021-02-18 LAB — LIPASE, BLOOD: Lipase: 56 U/L — ABNORMAL HIGH (ref 11–51)

## 2021-02-18 MED ORDER — LACTATED RINGERS IV BOLUS
500.0000 mL | Freq: Once | INTRAVENOUS | Status: AC
  Administered 2021-02-19: 500 mL via INTRAVENOUS

## 2021-02-18 NOTE — ED Triage Notes (Signed)
Patient arrives from [facility] with [facility], had labs drawn on 6/14- total bilirubin 4.3, repeat drawn this morning was 5.6, patient has no complaints  EMS vitals  118/50 48 HR afib 98% RA

## 2021-02-18 NOTE — ED Provider Notes (Signed)
[facility] EMERGENCY DEPARTMENT Provider Note   CSN: [id number] Arrival date & time: 02/18/21  2102     History Chief Complaint  Patient presents with   Abnormal Lab    DAREN YEAGLE is a 87 y.o. male.  The history is provided by the patient, the nursing home and medical records.  Abnormal Lab  Elevated bilirubin on lab work obtained at skilled nursing facility.  Labs were drawn on 6/14 and resulted today.  At the time of my exam patient is able to tell me that he had blood work drawn at his facility and they noticed an abnormality which is why he is in the emergency department.  He tells me that he does sometimes have abdominal pain but cannot specify where. He denies nausea or vomiting.  Denies fever.  Denies shortness of breath or chest pain.  I spoke with the patient's caregiver at his facility.  She tells me that they noticed that the patient looked more jaundiced over the last week or so.  He has also had intermittent vomiting and complaining of either right abdominal pain or right shoulder pain.  No fever reported. Had covid 2.5 weeks ago (dry cough, fever, no oxygen requirement or hospitalization) and came off precautions last week.  History limited by patient's cognitive impairment.  Level 5 caveat applies.     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   Hyperglycemia 01/02/2013   Hypertrophy of prostate without urinary obstruction and other lower urinary tract symptoms (LUTS) 2002   Insomnia with sleep apnea,  unspecified 02/2010   Irritable bowel syndrome 11/14/2011   Long term (current) use of anticoagulants 11/2009   Mild cognitive impairment 04/19/2020   Other abnormal blood chemistry 02/09/2009   Other B-complex deficiencies 2008   Other dyspnea and respiratory abnormality 11/2009   Other malaise and fatigue 2007   Pain in joint, lower leg 02/2010   Palpitations 2007   Reflux esophagitis 2007   Sebaceous cyst 2008   Tension headache 2009   Undiagnosed cardiac murmurs 2002   Unspecified constipation 07/04/2012   Unspecified essential hypertension 2007   Unspecified hereditary and idiopathic peripheral neuropathy 02/2010   Unspecified transient cerebral ischemia 2002   Urinary frequency 10/01/2012    Patient Active Problem List   Diagnosis Date Noted   Pain of left heel 01/18/2021   Sick sinus syndrome ([place]) 04/19/2020   Mild cognitive impairment 04/19/2020   Excessive sleepiness 08/07/2019   Bradycardia 08/07/2019   Bilateral primary osteoarthritis of knee 08/07/2019   Lactose intolerance 07/18/2018   Generalized weakness 07/18/2018   Aortic valve disorder 07/18/2018   Primary osteoarthritis of right knee 07/18/2018   Hypothyroidism 07/17/2017   Hemoptysis 06/03/2015   Rectal bleeding 06/03/2015   Loose stools 06/03/2015   Chronic congestive heart failure ([place]) 06/03/2015   Edema 06/03/2015   Constipation 12/25/2014   BPH (benign prostatic hyperplasia) 03/12/2014   Other malaise and fatigue 12/04/2013   Healthcare maintenance 12/04/2013   Chronic kidney disease, stage III (moderate) ([place]) 08/07/2013  Advanced directives, counseling/discussion 04/03/2013   Atrial fibrillation ([place]) 01/02/2013   Essential hypertension, benign 01/02/2013   Hyperglycemia 01/02/2013    History reviewed. No pertinent surgical history.     Family History  Problem Relation Age of Onset   Congestive Heart Failure Mother    Diabetes Father    Heart disease Father    Lung cancer Sister    Kidney  disease Sister     Social History   Tobacco Use   Smoking status: Never   Smokeless tobacco: Never  Vaping Use   Vaping Use: Never used  Substance Use Topics   Alcohol use: Yes    Comment: 2oz a week   Drug use: No    Home Medications Prior to Admission medications   Medication Sig Start Date End Date Taking? Authorizing Provider  Acetaminophen 500 MG coapsule Take 1 capsule by mouth at bedtime. And 2 by mouth as needed through out the day for pain   Yes [provider]  apixaban (ELIQUIS) 2.5 MG TABS tablet Take 1 tablet (2.5 mg total) by mouth 2 (two) times daily. Patient taking differently: Take 2.5 mg by mouth 2 (two) times daily. For Afib and stroke prophylaxis 10/22/19  Yes Reed, Tiffany L, DO  doxazosin (CARDURA) 2 MG tablet Take 1 tablet (2 mg total) by mouth daily. 10/22/19  Yes Reed, Tiffany L, DO  Emollient (CERAVE DAILY MOISTURIZING) LOTN Apply topically. Apply to legs/feet before compression socks   Yes [provider]  Eyelid Cleansers (OCUSOFT EYELID CLEANSING) PADS Apply 1 application topically as directed.   Yes [provider]  fluticasone (FLONASE) 50 MCG/ACT nasal spray Place 2 sprays into both nostrils 2 (two) times daily.   Yes [provider]  furosemide (LASIX) 40 MG tablet Take 40 mg by mouth daily.    Yes [provider]  gabapentin (NEURONTIN) 100 MG capsule Take 200 mg by mouth at bedtime.   Yes [provider]  Lactase 9000 units CHEW chew 1 tab (9,000 u) by mouth w/ first bite of milk product PRN lactose intolerant.   Yes [provider]  Levothyroxine Sodium 50 MCG CAPS Take 1 capsule (50 mcg total) by mouth daily before breakfast. 05/30/18  Yes Reed, Tiffany L, DO  Melatonin 3 MG CAPS Take 1 capsule (3 mg total) by mouth at bedtime. 12/13/19  Yes Reed, Tiffany L, DO  metoprolol tartrate (LOPRESSOR) 12.5 mg TABS tablet Take 12.5 mg by mouth as needed (if Heart Rate is =/> 110).   Yes [provider]  mupirocin ointment (BACTROBAN) 2 % Apply 1 application topically as needed. Apply to left buttock skin tag, no more than 2 times a day until healed 08/09/18  Yes [provider]  omeprazole (PRILOSEC OTC) 20 MG tablet Take 20 mg by mouth daily.   Yes [provider]  ondansetron (ZOFRAN) 4 MG tablet Take 4 mg by mouth every 8 (eight) hours as needed for nausea or vomiting.   Yes [provider]  Polyvinyl Alcohol-Povidone PF 1.4-0.6 % SOLN Apply 1.4 % to eye 2 (two) times daily.    Yes [provider]  potassium chloride 20 MEQ/15ML (10%) SOLN Take 15 mLs by mouth daily.  12/24/17  Yes [provider]  promethazine (PHENERGAN) 25 MG tablet Take 25 mg by mouth every 6 (six) hours as needed for nausea or vomiting (If vomiting persists after 2 doses, notify MD/NP.).   Yes [provider]  trolamine salicylate (ASPERCREME) 10 %  cream Apply 1 application topically 2 (two) times daily as needed.   Yes [provider]  vitamin B-12 (CYANOCOBALAMIN) 1000 MCG tablet Take 1,000 mcg by mouth daily.   Yes [provider]    Allergies    Other and Plavix [clopidogrel bisulfate]  Review of Systems   Review of Systems  Constitutional:  Positive for appetite change. Negative for chills and fever.  HENT:  Negative for ear pain and sore throat.   Eyes:  Negative for pain and visual disturbance.  Respiratory:  Negative for cough and shortness of breath.   Cardiovascular:  Negative for chest pain and palpitations.  Gastrointestinal:  Positive for abdominal pain, nausea and vomiting.  Genitourinary:  Negative for dysuria and hematuria.  Musculoskeletal:  Negative for arthralgias and back pain.  Skin:  Negative for color change and rash.  Neurological:  Negative for seizures and syncope.  All other systems reviewed and are negative.  Physical Exam Updated Vital Signs BP 119/62   Pulse (!) 51   Temp 97.7 F (36.5 C)  (Temporal)   Resp 17   Ht 5\' 9"  (1.753 m)   Wt 82.8 kg   SpO2 98%   BMI 26.96 kg/m   Physical Exam Vitals and nursing note reviewed.  Constitutional:      Appearance: He is ill-appearing.     Comments: Elderly appearing  HENT:     Head: Normocephalic and atraumatic.     Mouth/Throat:     Mouth: Mucous membranes are moist.  Eyes:     General: Scleral icterus present.     Extraocular Movements: Extraocular movements intact.     Conjunctiva/sclera: Conjunctivae normal.  Cardiovascular:     Rate and Rhythm: Regular rhythm. Bradycardia present.     Pulses:          Radial pulses are 2+ on the right side and 2+ on the left side.       Dorsalis pedis pulses are 1+ on the right side and 1+ on the left side.     Heart sounds: No murmur heard. Pulmonary:     Effort: Pulmonary effort is normal. No respiratory distress.     Breath sounds: Normal breath sounds.  Abdominal:     General: Abdomen is protuberant.     Palpations: Abdomen is soft. There is hepatomegaly.     Tenderness: There is abdominal tenderness in the right upper quadrant. There is no guarding or rebound.     Comments:  Right sided abdominal surgical scar  Musculoskeletal:     Cervical back: Full passive range of motion without pain, normal range of motion and neck supple.     Right lower leg: 2+ Pitting Edema present.     Left lower leg: 2+ Pitting Edema present.  Skin:    General: Skin is warm and dry.     Coloration: Skin is jaundiced.  Neurological:     Mental Status: He is alert. Mental status is at baseline. He is confused.     Cranial Nerves: Cranial nerves are intact.     Sensory: Sensation is intact.     Motor: Motor function is intact.     Comments:  Alert. Oriented to person, place but not time. Able to accurately tell me why he is in the ED, where he lives and what symptoms he has been having.    ED Results / Procedures / Treatments   Labs (all labs ordered are listed, but only abnormal results are  displayed) Labs Reviewed  COMPREHENSIVE METABOLIC PANEL - Abnormal; Notable for the following components:      Result Value   Glucose, Bld 108 (*)    BUN 51 (*)    Creatinine, Ser 2.20 (*)    Calcium 8.1 (*)    Total Protein 5.7 (*)    Albumin 2.4 (*)    AST 79 (*)    ALT 81 (*)    Alkaline Phosphatase 220 (*)    Total Bilirubin 6.2 (*)    GFR, Estimated 26 (*)    All other components within normal limits  CBC - Abnormal; Notable for the following components:   RBC 3.75 (*)    Hemoglobin 11.8 (*)    HCT 37.0 (*)    Platelets 145 (*)    All other components within normal limits  LIPASE, BLOOD - Abnormal; Notable for the following components:   Lipase 56 (*)    All other components within normal limits  MAGNESIUM  LACTIC ACID, PLASMA  URINALYSIS, ROUTINE W REFLEX MICROSCOPIC  BILIRUBIN, DIRECT  HEPATITIS PANEL, ACUTE  ACETAMINOPHEN LEVEL  SALICYLATE LEVEL  AMMONIA    EKG EKG Interpretation  Date/Time:  Thursday February 18 2021 21:10:17 EDT Ventricular Rate:  47 PR Interval:    QRS Duration: 102 QT Interval:  594 QTC Calculation: 526 R Axis:   55 Text Interpretation: Atrial fibrillation with slow ventricular response Ventricular premature complex Prolonged QT interval Nonspecific T wave abnormality Confirmed by Lajean Saver [phone]) on 02/18/2021 9:42:07 PM  Radiology CT ABDOMEN PELVIS WO CONTRAST  Result Date: 02/19/2021 CLINICAL DATA:  Abdomen distension abnormal labs elevated bilirubin EXAM: CT ABDOMEN AND PELVIS WITHOUT CONTRAST TECHNIQUE: Multidetector CT imaging of the abdomen and pelvis was performed following the standard protocol without IV contrast. COMPARISON:  None. FINDINGS: Lower chest: Lung bases demonstrate trace left pleural effusion. Cardiomegaly with trace pericardial effusion. Mitral calcification. Coronary vascular calcification. Hepatobiliary: No focal hepatic abnormality. Status post cholecystectomy. Extrahepatic common bile duct up to 8 mm. Pancreas:  Possible mild peripancreatic fat stranding. No ductal dilatation. Suspected 18 mm cyst at the pancreatic neck, series 3, image 30. Spleen: Normal in size without focal abnormality. Adrenals/Urinary Tract: Adrenal glands are normal. Kidneys slightly atrophic. No hydronephrosis. Punctate stones lower pole left kidney. Distended urinary bladder. Stomach/Bowel: Stomach nonenlarged. No dilated small bowel. Diverticular disease of the colon without acute wall thickening. Vascular/Lymphatic: Advanced aortic atherosclerosis. No aneurysm. No suspicious nodes. Reproductive: Enlarged prostate with calcification Other: Mild presacral edema and stranding. No significant pelvic effusion or ascites. Right lateral abdominal wall laxity with bulging of fat and bowel. Musculoskeletal: Degenerative changes. No acute or suspicious osseous abnormality IMPRESSION: 1. Status post cholecystectomy. No intra hepatic biliary dilatation. Slightly enlarged extrahepatic common bile duct, probably due to postsurgical change. Follow-up MRCP if ductal obstruction remains a concern. 2. Slightly indistinct appearance of pancreas, question mild pancreatitis, correlate with appropriate enzymes. Suspected 18 mm cyst at the pancreatic neck 3. Nonobstructing stones in the left kidney 4. Diverticular disease of the colon without acute inflammation Electronically Signed   By: Donavan Foil M.D.   On: 02/19/2021 00:05    Procedures Procedures   Medications Ordered in ED Medications  lactated ringers bolus 500 mL (has no administration in time range)    ED Course  I have reviewed the triage vital signs and the nursing notes.  Pertinent labs & imaging results that were available during my care of the patient were reviewed by me and considered in my medical decision making (see chart for  details).    MDM Rules/Calculators/A&P                          Impression is (largely) asymptomatic and painless jaundice in this 87 year old male with h/o  previous cholecystectomy. He is not septic.  Clinical exam is not consistent with cholangitis. No previous history of cirrhosis of any kind that I am able to find.   Differentials include but are not limited to neoplasm, pancreatitis, hepatitis, as well as additional causes of hepatic and post hepatic hyperbilirubinemia. He also has an AKI, unclear etiology at this time. Treated with soft crystalloid in the ED.  CT without any clear surgical etiology that would require general surgery consultation at this time. I ordered additional lab work to further differentiate including hepatitis panel, Tylenol, salicylate levels.  Patient will require further work-up to likely include MRCP.  Admitted to medicine.  Final Clinical Impression(s) / ED Diagnoses Final diagnoses:  Elevated bilirubin  AKI (acute kidney injury) [facility])    Rx / DC Orders ED Discharge Orders     None        Pearson Grippe, DO 02/19/21 [id number]    Lajean Saver, MD 02/19/21 1900

## 2021-02-19 ENCOUNTER — Observation Stay (HOSPITAL_COMMUNITY): Payer: Medicare Other

## 2021-02-19 ENCOUNTER — Encounter (HOSPITAL_COMMUNITY): Payer: Self-pay | Admitting: *Deleted

## 2021-02-19 DIAGNOSIS — R945 Abnormal results of liver function studies: Secondary | ICD-10-CM

## 2021-02-19 DIAGNOSIS — N1832 Chronic kidney disease, stage 3b: Secondary | ICD-10-CM | POA: Diagnosis not present

## 2021-02-19 DIAGNOSIS — E039 Hypothyroidism, unspecified: Secondary | ICD-10-CM | POA: Diagnosis not present

## 2021-02-19 DIAGNOSIS — R350 Frequency of micturition: Secondary | ICD-10-CM

## 2021-02-19 DIAGNOSIS — R9431 Abnormal electrocardiogram [ECG] [EKG]: Secondary | ICD-10-CM | POA: Diagnosis not present

## 2021-02-19 DIAGNOSIS — K831 Obstruction of bile duct: Secondary | ICD-10-CM

## 2021-02-19 DIAGNOSIS — R935 Abnormal findings on diagnostic imaging of other abdominal regions, including retroperitoneum: Secondary | ICD-10-CM

## 2021-02-19 DIAGNOSIS — K838 Other specified diseases of biliary tract: Secondary | ICD-10-CM

## 2021-02-19 DIAGNOSIS — U071 COVID-19: Secondary | ICD-10-CM | POA: Diagnosis not present

## 2021-02-19 DIAGNOSIS — I482 Chronic atrial fibrillation, unspecified: Secondary | ICD-10-CM | POA: Diagnosis not present

## 2021-02-19 DIAGNOSIS — K8050 Calculus of bile duct without cholangitis or cholecystitis without obstruction: Secondary | ICD-10-CM | POA: Diagnosis not present

## 2021-02-19 DIAGNOSIS — N401 Enlarged prostate with lower urinary tract symptoms: Secondary | ICD-10-CM

## 2021-02-19 DIAGNOSIS — L89159 Pressure ulcer of sacral region, unspecified stage: Secondary | ICD-10-CM | POA: Diagnosis present

## 2021-02-19 DIAGNOSIS — K219 Gastro-esophageal reflux disease without esophagitis: Secondary | ICD-10-CM | POA: Diagnosis present

## 2021-02-19 DIAGNOSIS — Z7189 Other specified counseling: Secondary | ICD-10-CM

## 2021-02-19 DIAGNOSIS — I10 Essential (primary) hypertension: Secondary | ICD-10-CM | POA: Diagnosis not present

## 2021-02-19 DIAGNOSIS — J90 Pleural effusion, not elsewhere classified: Secondary | ICD-10-CM | POA: Diagnosis not present

## 2021-02-19 DIAGNOSIS — R17 Unspecified jaundice: Secondary | ICD-10-CM | POA: Diagnosis not present

## 2021-02-19 DIAGNOSIS — N179 Acute kidney failure, unspecified: Secondary | ICD-10-CM | POA: Diagnosis not present

## 2021-02-19 DIAGNOSIS — I517 Cardiomegaly: Secondary | ICD-10-CM | POA: Diagnosis not present

## 2021-02-19 DIAGNOSIS — J189 Pneumonia, unspecified organism: Secondary | ICD-10-CM | POA: Diagnosis not present

## 2021-02-19 LAB — CBC WITH DIFFERENTIAL/PLATELET
Abs Immature Granulocytes: 0.03 10*3/uL (ref 0.00–0.07)
Basophils Absolute: 0 10*3/uL (ref 0.0–0.1)
Basophils Relative: 0 %
Eosinophils Absolute: 0.2 10*3/uL (ref 0.0–0.5)
Eosinophils Relative: 3 %
HCT: 35.2 % — ABNORMAL LOW (ref 39.0–52.0)
Hemoglobin: 11.5 g/dL — ABNORMAL LOW (ref 13.0–17.0)
Immature Granulocytes: 1 %
Lymphocytes Relative: 6 %
Lymphs Abs: 0.3 10*3/uL — ABNORMAL LOW (ref 0.7–4.0)
MCH: 31.8 pg (ref 26.0–34.0)
MCHC: 32.7 g/dL (ref 30.0–36.0)
MCV: 97.2 fL (ref 80.0–100.0)
Monocytes Absolute: 0.7 10*3/uL (ref 0.1–1.0)
Monocytes Relative: 13 %
Neutro Abs: 4 10*3/uL (ref 1.7–7.7)
Neutrophils Relative %: 77 %
Platelets: 146 10*3/uL — ABNORMAL LOW (ref 150–400)
RBC: 3.62 MIL/uL — ABNORMAL LOW (ref 4.22–5.81)
RDW: 13.8 % (ref 11.5–15.5)
WBC: 5.2 10*3/uL (ref 4.0–10.5)
nRBC: 0 % (ref 0.0–0.2)

## 2021-02-19 LAB — BASIC METABOLIC PANEL
Anion gap: 11 (ref 5–15)
BUN: 46 mg/dL — ABNORMAL HIGH (ref 8–23)
CO2: 26 mmol/L (ref 22–32)
Calcium: 8.2 mg/dL — ABNORMAL LOW (ref 8.9–10.3)
Chloride: 102 mmol/L (ref 98–111)
Creatinine, Ser: 2.03 mg/dL — ABNORMAL HIGH (ref 0.61–1.24)
GFR, Estimated: 28 mL/min — ABNORMAL LOW (ref >60)
Glucose, Bld: 97 mg/dL (ref 70–99)
Potassium: 3.3 mmol/L — ABNORMAL LOW (ref 3.5–5.1)
Sodium: 139 mmol/L (ref 135–145)

## 2021-02-19 LAB — SARS CORONAVIRUS 2 (TAT 6-24 HRS): SARS Coronavirus 2: POSITIVE — AB

## 2021-02-19 LAB — HEPATIC FUNCTION PANEL
ALT: 78 U/L — ABNORMAL HIGH (ref 0–44)
AST: 73 U/L — ABNORMAL HIGH (ref 15–41)
Albumin: 2.3 g/dL — ABNORMAL LOW (ref 3.5–5.0)
Alkaline Phosphatase: 209 U/L — ABNORMAL HIGH (ref 38–126)
Bilirubin, Direct: 3.6 mg/dL — ABNORMAL HIGH (ref 0.0–0.2)
Indirect Bilirubin: 1.9 mg/dL — ABNORMAL HIGH (ref 0.3–0.9)
Total Bilirubin: 5.5 mg/dL — ABNORMAL HIGH (ref 0.3–1.2)
Total Protein: 5.7 g/dL — ABNORMAL LOW (ref 6.5–8.1)

## 2021-02-19 LAB — MAGNESIUM: Magnesium: 1.8 mg/dL (ref 1.7–2.4)

## 2021-02-19 LAB — HEPATITIS PANEL, ACUTE
HCV Ab: NONREACTIVE
Hep A IgM: NONREACTIVE
Hep B C IgM: NONREACTIVE
Hepatitis B Surface Ag: NONREACTIVE

## 2021-02-19 LAB — AMMONIA: Ammonia: 23 umol/L (ref 9–35)

## 2021-02-19 LAB — APTT: aPTT: 34 seconds (ref 24–36)

## 2021-02-19 LAB — SALICYLATE LEVEL: Salicylate Lvl: <7 mg/dL — ABNORMAL LOW (ref 7.0–30.0)

## 2021-02-19 LAB — PROTIME-INR
INR: 1.4 — ABNORMAL HIGH (ref 0.8–1.2)
Prothrombin Time: 17.3 seconds — ABNORMAL HIGH (ref 11.4–15.2)

## 2021-02-19 LAB — BILIRUBIN, DIRECT: Bilirubin, Direct: 3.4 mg/dL — ABNORMAL HIGH (ref 0.0–0.2)

## 2021-02-19 LAB — ACETAMINOPHEN LEVEL: Acetaminophen (Tylenol), Serum: <10 ug/mL — ABNORMAL LOW (ref 10–30)

## 2021-02-19 MED ORDER — DOXAZOSIN MESYLATE 2 MG PO TABS
2.0000 mg | ORAL_TABLET | Freq: Every day | ORAL | Status: DC
  Administered 2021-02-19 – 2021-02-23 (×4): 2 mg via ORAL
  Filled 2021-02-19 (×5): qty 1

## 2021-02-19 MED ORDER — POLYETHYLENE GLYCOL 3350 17 G PO PACK: 17.0000 g | PACK | Freq: Every day | ORAL | Status: DC | PRN

## 2021-02-19 MED ORDER — LEVOTHYROXINE SODIUM 25 MCG PO TABS
50.0000 ug | ORAL_TABLET | Freq: Every day | ORAL | Status: DC
  Administered 2021-02-19 – 2021-02-23 (×4): 50 ug via ORAL
  Filled 2021-02-19 (×6): qty 2

## 2021-02-19 MED ORDER — ACETAMINOPHEN 650 MG RE SUPP: 650.0000 mg | Freq: Four times a day (QID) | RECTAL | Status: DC | PRN

## 2021-02-19 MED ORDER — PANTOPRAZOLE SODIUM 40 MG PO TBEC
40.0000 mg | DELAYED_RELEASE_TABLET | Freq: Every day | ORAL | Status: DC
  Administered 2021-02-19 – 2021-02-23 (×5): 40 mg via ORAL
  Filled 2021-02-19 (×4): qty 1

## 2021-02-19 MED ORDER — GABAPENTIN 100 MG PO CAPS
200.0000 mg | ORAL_CAPSULE | Freq: Every day | ORAL | Status: DC
  Administered 2021-02-19 – 2021-02-22 (×4): 200 mg via ORAL
  Filled 2021-02-19 (×4): qty 2

## 2021-02-19 MED ORDER — APIXABAN 2.5 MG PO TABS
2.5000 mg | ORAL_TABLET | Freq: Two times a day (BID) | ORAL | Status: DC
  Filled 2021-02-19: qty 1

## 2021-02-19 MED ORDER — LACTATED RINGERS IV SOLN: INTRAVENOUS | Status: AC

## 2021-02-19 MED ORDER — GADOBUTROL 1 MMOL/ML IV SOLN
8.0000 mL | Freq: Once | INTRAVENOUS | Status: AC | PRN
  Administered 2021-02-19: 8 mL via INTRAVENOUS

## 2021-02-19 MED ORDER — FLUTICASONE PROPIONATE 50 MCG/ACT NA SUSP
2.0000 | Freq: Two times a day (BID) | NASAL | Status: DC
  Administered 2021-02-19 – 2021-02-23 (×6): 2 via NASAL
  Filled 2021-02-19: qty 16

## 2021-02-19 MED ORDER — ACETAMINOPHEN 325 MG PO TABS
650.0000 mg | ORAL_TABLET | Freq: Four times a day (QID) | ORAL | Status: DC | PRN
  Administered 2021-02-21 – 2021-02-22 (×2): 650 mg via ORAL
  Filled 2021-02-19 (×2): qty 2

## 2021-02-19 MED ORDER — MELATONIN 3 MG PO TABS
3.0000 mg | ORAL_TABLET | Freq: Every evening | ORAL | Status: DC | PRN
  Filled 2021-02-19: qty 1

## 2021-02-19 NOTE — H&P (Signed)
History and Physical    Gary [name] OJJ:[id number] DOB: 12/21/1915 DOA: 02/18/2021  PCP: Virgie Dad, MD  Patient coming from: [facility]   Chief Complaint:  Chief Complaint  Patient presents with   Abnormal Lab     HPI: 87 year old male with past medical history of hypothyroidism, hypertension, hypothyroidism, benign prostatic hyperplasia, chronic kidney disease stage IIIb, chronic atrial fibrillation (on anticoagulation) and gastroesophageal reflux disease who presents to [facility] emergency department due to progressively worsening jaundice and abnormal LFTs.  Patient explains that for the past several weeks he has been feeling a sense of generalized malaise and weakness.  Is been associated with poor appetite.  Patient attributed this to an episode of COVID-19 infection that the patient experienced somewhat over a month ago.  At this point, patient denies fevers, sick contacts, confirmed contact with COVID-19, diarrhea, chest pain, shortness of breath, cough, dysuria or sudden changes in bowel habits.  Patient's symptoms of generalized malaise weakness and poor appetite continue to persist and several days ago staff at the patient's skilled nursing facility noted that the patient was becoming jaundiced.  A hepatic function panel was obtained and patient was noted to have a marked hyperbilirubinemia.  Patient was therefore sent to [facility] emergency department for further evaluation.  Upon evaluation in the emergency department [place] function panel confirmed that patient had a total bilirubin of 6.2 with elevated alkaline phosphatase of 220, low albumin of 2.4 and slightly elevated transaminases with AST of 79 and ALT of 81.  CT imaging of the abdomen pelvis with contrast was then performed revealing evidence of an enlarged common bile duct with MRCP recommended for further evaluation if concern for a ductal obstruction.  Furthermore, an indiscriminate  appearance of the pancreas with an 18 mm cystic structure noted at the pancreatic neck.  The hospitalist group was then called to assess the patient for admission to the hospital.  Review of Systems:   Review of Systems  Constitutional:  Positive for malaise/fatigue.  Gastrointestinal:  Positive for abdominal pain.  All other systems reviewed and are negative.  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   Hyperglycemia 01/02/2013   Hypertrophy of prostate without urinary obstruction and other lower urinary tract symptoms (LUTS) 2002   Insomnia with sleep apnea, unspecified 02/2010   Irritable bowel syndrome 11/14/2011   Long term (current) use of anticoagulants 11/2009   Mild cognitive impairment 04/19/2020   Other abnormal blood chemistry 02/09/2009   Other B-complex deficiencies 2008   Other dyspnea and respiratory abnormality 11/2009   Other malaise and fatigue 2007   Pain in joint, lower leg 02/2010   Palpitations 2007   Reflux esophagitis 2007   Sebaceous cyst 2008   Tension headache 2009   Undiagnosed cardiac murmurs 2002   Unspecified constipation 07/04/2012   Unspecified essential hypertension 2007   Unspecified hereditary and idiopathic peripheral neuropathy 02/2010   Unspecified transient cerebral ischemia 2002   Urinary frequency 10/01/2012    History reviewed. No pertinent surgical history.  reports that he has never smoked. He has never used smokeless tobacco. He reports current alcohol use. He reports that he does not use drugs.  Allergies   Allergen Reactions   Other Other (See Comments)    Peppers. Unknown reaction   Plavix [Clopidogrel Bisulfate] Other (See Comments)    Indigestion, GI upset    Family History  Problem Relation Age of Onset   Congestive Heart Failure Mother    Diabetes Father    Heart disease Father    Lung cancer Sister    Kidney disease Sister      Prior to Admission medications   Medication Sig Start Date End Date Taking? Authorizing Provider  Acetaminophen 500 MG coapsule Take 1 capsule by mouth at bedtime. And 2 by mouth as needed through out the day for pain   Yes [provider]  apixaban (ELIQUIS) 2.5 MG TABS tablet Take 1 tablet (2.5 mg total) by mouth 2 (two) times daily. Patient taking differently: Take 2.5 mg by mouth 2 (two) times daily. For Afib and stroke prophylaxis 10/22/19  Yes Reed, Tiffany L, DO  doxazosin (CARDURA) 2 MG tablet Take 1 tablet (2 mg total) by mouth daily. 10/22/19  Yes Reed, Tiffany L, DO  Emollient (CERAVE DAILY MOISTURIZING) LOTN Apply topically. Apply to legs/feet before compression socks   Yes [provider]  Eyelid Cleansers (OCUSOFT EYELID CLEANSING) PADS Apply 1 application topically as directed.   Yes [provider]  fluticasone (FLONASE) 50 MCG/ACT nasal spray Place 2 sprays into both nostrils 2 (two) times daily.   Yes [provider]  furosemide (LASIX) 40 MG tablet Take 40 mg by mouth daily.    Yes [provider]  gabapentin (NEURONTIN) 100 MG capsule Take 200 mg by mouth at bedtime.   Yes [provider]  Lactase 9000 units CHEW chew 1 tab (9,000 u) by mouth w/ first bite of milk product PRN lactose intolerant.   Yes [provider]  Levothyroxine Sodium 50 MCG CAPS Take 1 capsule (50 mcg total) by mouth daily before breakfast. 05/30/18  Yes Reed, Tiffany L, DO  Melatonin 3 MG CAPS Take 1 capsule (3 mg total) by mouth at bedtime. 12/13/19  Yes Reed, Tiffany L, DO  metoprolol tartrate (LOPRESSOR)  12.5 mg TABS tablet Take 12.5 mg by mouth as needed (if Heart Rate is =/> 110).   Yes [provider]  mupirocin ointment (BACTROBAN) 2 % Apply 1 application topically as needed. Apply to left buttock skin tag, no more than 2 times a day until healed 08/09/18  Yes [provider]  omeprazole (PRILOSEC OTC) 20 MG tablet Take 20 mg by mouth daily.   Yes [provider]  ondansetron (ZOFRAN) 4 MG tablet Take 4 mg by mouth every 8 (eight) hours as needed for nausea or vomiting.   Yes [provider]  Polyvinyl Alcohol-Povidone PF 1.4-0.6 % SOLN Apply 1.4 % to eye 2 (two) times daily.    Yes [provider]  potassium chloride 20 MEQ/15ML (10%) SOLN Take 15 mLs by mouth daily.  12/24/17  Yes [provider]  promethazine (PHENERGAN) 25 MG tablet Take 25 mg by mouth every 6 (six) hours as needed for nausea or vomiting (If vomiting persists after 2 doses, notify MD/NP.).   Yes [provider]  trolamine salicylate (ASPERCREME) 10 % cream Apply 1 application topically 2 (two) times daily as needed.   Yes [provider]  vitamin B-12 (CYANOCOBALAMIN) 1000 MCG tablet  Take 1,000 mcg by mouth daily.   Yes [provider]    Physical Exam: Vitals:   02/19/21 0145 02/19/21 0200 02/19/21 0215 02/19/21 0249  BP: (!) 127/51 111/66 (!) 134/56 (!) 133/52  Pulse: (!) 35 (!) 35 (!) 39 64  Resp: 16 17 18    Temp:   98.2 F (36.8 C) 97.8 F (36.6 C)  TempSrc:   Oral Oral  SpO2: 98% 99% 98% 97%  Weight:      Height:        Constitutional: Awake alert and oriented x3, no associated distress.   Skin: Significant development of Seaborg keratosis noted on along the right lateral scalp.  Otherwise, no significant rashes or lesions seen.  Notable poor skin turgor.   Eyes: Pupils are equally reactive to light.  No evidence of scleral icterus or conjunctival pallor.  ENMT: Extremely dry mucous membranes noted.  Posterior pharynx clear of any  exudate or lesions.   Neck: normal, supple, no masses, no thyromegaly.  No evidence of jugular venous distension.   Respiratory: clear to auscultation bilaterally, no wheezing, no crackles. Normal respiratory effort. No accessory muscle use.  Cardiovascular: Bradycardic rate with irregularly irregular rhythm.  Patient is exhibiting a systolic murmur.  Mild edema of the bilateral lower extremities that tracks from the feet up to the knees.  2+ pedal pulses. No carotid bruits.  Chest:   Nontender without crepitus or deformity.   Back:   Nontender without crepitus or deformity. Abdomen: Abdomen is soft and nontender.  No evidence of intra-abdominal masses.  Positive bowel sounds noted in all quadrants.   Musculoskeletal: No joint deformity upper and lower extremities. Good ROM, no contractures. Normal muscle tone.  Neurologic: CN 2-12 grossly intact. Sensation intact.  Patient moving all 4 extremities spontaneously.  Patient is following all commands.  Patient is responsive to verbal stimuli.   Psychiatric: Patient exhibits normal mood with appropriate affect.  Patient seems to possess insight as to their current situation.     Labs on Admission: I have personally reviewed following labs and imaging studies -   CBC: Recent Labs  Lab 02/18/21 2118  WBC 5.6  HGB 11.8*  HCT 37.0*  MCV 98.7  PLT 397*   Basic Metabolic Panel: Recent Labs  Lab 02/18/21 2118  NA 142  K 3.8  CL 104  CO2 27  GLUCOSE 108*  BUN 51*  CREATININE 2.20*  CALCIUM 8.1*  MG 2.1   GFR: Estimated Creatinine Clearance: 17 mL/min (A) (by C-G formula based on SCr of 2.2 mg/dL (H)). Liver Function Tests: Recent Labs  Lab 02/18/21 2118  AST 79*  ALT 81*  ALKPHOS 220*  BILITOT 6.2*  PROT 5.7*  ALBUMIN 2.4*   Recent Labs  Lab 02/18/21 2118  LIPASE 56*   Recent Labs  Lab 02/19/21 0139  AMMONIA 23   Coagulation Profile: No results for input(s): INR, PROTIME in the last 168 hours. Cardiac Enzymes: No  results for input(s): CKTOTAL, CKMB, CKMBINDEX, TROPONINI in the last 168 hours. BNP (last 3 results) No results for input(s): PROBNP in the last 8760 hours. HbA1C: No results for input(s): HGBA1C in the last 72 hours. CBG: No results for input(s): GLUCAP in the last 168 hours. Lipid Profile: No results for input(s): CHOL, HDL, LDLCALC, TRIG, CHOLHDL, LDLDIRECT in the last 72 hours. Thyroid Function Tests: No results for input(s): TSH, T4TOTAL, FREET4, T3FREE, THYROIDAB in the last 72 hours. Anemia Panel: No results for input(s): VITAMINB12, FOLATE, FERRITIN, TIBC, IRON,  RETICCTPCT in the last 72 hours. Urine analysis: No results found for: COLORURINE, APPEARANCEUR, LABSPEC, PHURINE, GLUCOSEU, HGBUR, BILIRUBINUR, KETONESUR, PROTEINUR, UROBILINOGEN, NITRITE, LEUKOCYTESUR  Radiological Exams on Admission - Personally Reviewed: CT ABDOMEN PELVIS WO CONTRAST  Result Date: 02/19/2021 CLINICAL DATA:  Abdomen distension abnormal labs elevated bilirubin EXAM: CT ABDOMEN AND PELVIS WITHOUT CONTRAST TECHNIQUE: Multidetector CT imaging of the abdomen and pelvis was performed following the standard protocol without IV contrast. COMPARISON:  None. FINDINGS: Lower chest: Lung bases demonstrate trace left pleural effusion. Cardiomegaly with trace pericardial effusion. Mitral calcification. Coronary vascular calcification. Hepatobiliary: No focal hepatic abnormality. Status post cholecystectomy. Extrahepatic common bile duct up to 8 mm. Pancreas: Possible mild peripancreatic fat stranding. No ductal dilatation. Suspected 18 mm cyst at the pancreatic neck, series 3, image 30. Spleen: Normal in size without focal abnormality. Adrenals/Urinary Tract: Adrenal glands are normal. Kidneys slightly atrophic. No hydronephrosis. Punctate stones lower pole left kidney. Distended urinary bladder. Stomach/Bowel: Stomach nonenlarged. No dilated small bowel. Diverticular disease of the colon without acute wall thickening.  Vascular/Lymphatic: Advanced aortic atherosclerosis. No aneurysm. No suspicious nodes. Reproductive: Enlarged prostate with calcification Other: Mild presacral edema and stranding. No significant pelvic effusion or ascites. Right lateral abdominal wall laxity with bulging of fat and bowel. Musculoskeletal: Degenerative changes. No acute or suspicious osseous abnormality IMPRESSION: 1. Status post cholecystectomy. No intra hepatic biliary dilatation. Slightly enlarged extrahepatic common bile duct, probably due to postsurgical change. Follow-up MRCP if ductal obstruction remains a concern. 2. Slightly indistinct appearance of pancreas, question mild pancreatitis, correlate with appropriate enzymes. Suspected 18 mm cyst at the pancreatic neck 3. Nonobstructing stones in the left kidney 4. Diverticular disease of the colon without acute inflammation Electronically Signed   By: Donavan Foil M.D.   On: 02/19/2021 00:05   DG Chest 1 View  Result Date: 02/19/2021 CLINICAL DATA:  Pneumonia EXAM: CHEST  1 VIEW COMPARISON:  CT abdomen and pelvis 02/18/2021, chest radiograph 10/23/2016 FINDINGS: Some residual patchy opacities are present in the right mid to lower lung. No visible layering effusion or pneumothorax. Pulmonary vascularity is normally distributed. Cardiomegaly is similar to prior with a calcified, tortuous aorta. Slight rightward mediastinal shift with tracheal tortuosity is also stable. Degenerative changes are present in the imaged spine and shoulders. Telemetry leads overlie the chest. IMPRESSION: 1. Some residual ill-defined opacities present in the right mid to lower lung may be atelectatic or related to infectious or inflammatory consolidation. 2. Enlarged cardiac silhouette may reflect a combination of cardiomegaly and pericardial effusion seen on comparison CT imaging. 3. Aortic Atherosclerosis (ICD10-I70.0). Electronically Signed   By: Lovena Le M.D.   On: 02/19/2021 02:36    EKG: Personally  reviewed.  Rhythm is atrial fibrillation with heart rate of slow ventricular rate of 47 bpm.  Notable prolonged QT of 526.  No dynamic ST segment changes appreciated.  Assessment/Plan Principal Problem:   Jaundice  Patient is presenting with mild jaundice in the setting of a several week history of poor appetite and generalized malaise Evidence of abnormal hepatic function panel with markedly elevated total bilirubin of 6.2, alkaline phosphatase of 220 and mild elevation of the transaminases CT imaging the abdomen pelvis does reveal a dilated common bile duct Abdominal exam reveals little to no tenderness being about concern for possible underlying malignancy Per my discussion with patient and family, while patient wishes any invasive intervention to be limited patient still wishes to be hospitalized for further work-up. Will place order for MRCP Will place order for  GI consultation in the morning.  Active Problems:   Atrial fibrillation, chronic (HCC)  Continue home regimen of Eliquis Bleed bradycardic and therefore no rate controlling agent needed. Monitoring patient on telemetry    Chronic kidney disease, stage 3b (HCC)  Creatinine currently slightly above baseline, not enough to be considered acute kidney injury Gently hydrating patient with intravenous isotonic fluids Strict input and output monitoring Monitoring renal function and electrolytes with serial chemistries Minimizing nephrotoxic agents if at all possible    Goals of care, counseling/discussion  Considering patient's advanced age and comorbidities I explained to the patient that any intensive or invasive intervention would come with extremely high risk Patient is voiced that he would prefer not to pursue any invasive intervention/surgical procedures.  Patient wishes to undergo an invasive work-up of his abnormal liver enzymes at this time I would also like to have the opportunity to speak to gastroenterology concerning  his diagnosis Patient has confirmed that he is a DNR    BPH (benign prostatic hyperplasia)  Continue home regimen of Cardura    Hypothyroidism  Continue home regimen of Synthroid    Prolonged Q-T interval on ECG  Noted on admission EKG Minimize QT prolonging agents Monitoring patient on telemetry    GERD without esophagitis  Continuing home regimen of daily PPI therapy.   Code Status:  DNR Family Communication: Discussed patient care with son via phone conversation.  Status is: Observation  The patient remains OBS appropriate and will d/c before 2 midnights.  Dispo: The patient is from: SNF              Anticipated d/c is to: SNF              Patient currently is not medically stable to d/c.   Difficult to place patient No        Vernelle Emerald MD [facility] Pager [phone]  If 7PM-7AM, please contact night-coverage [email or web address] Use universal [place] password for that web site. If you do not have the password, please call the hospital operator.  02/19/2021, 3:32 AM

## 2021-02-19 NOTE — Progress Notes (Signed)
Infection Prevention reviewed this patient's chart for isolation & determined he does not meet criteria for isolation.  Advised charge nurse Manuela Schwartz to contact provider regarding discontinuing isolation. 01/31/21 + covid at [facility] (assisted living) 02/18/21 returns for jaundice & malaise, retested & + covid  Willey Blade, RN, MSN -Infection Prevention

## 2021-02-19 NOTE — Progress Notes (Signed)
Collaborated with [place] with IP.  Gary [name] states that she does not recommend isolation for this patient based on being asymptomatic and test result being greater than 10 days ago.  Contacted Dr. Arbutus Ped, who stated that patient does not need to be on isolation.

## 2021-02-19 NOTE — Consult Note (Addendum)
[place] Nurse Consult Note: Patient receiving care in [facility] 5N09 Admitted from [facility] Patient very hard of hearing with hearing aids in.  Reason for Consult: Buttock wound Wound type: DTPI on the coccyx Pressure Injury POA: Yes Wound bed: Purple/Maroon Drainage (amount, consistency, odor) None Periwound: Intact Dressing procedure/placement/frequency: Clean the sacral area with no rinse cleanser, pat dry. Apply Xeroform gauze to the wound then secure with sacral foam dressing with the tip upward to prevent soiling from incontinence. Change Xeroform gauze daily. Have ordered mattress replacement for standard size air mattress.  Monitor the wound area(s) for worsening of condition such as: Signs/symptoms of infection, increase in size, development of or worsening of odor, development of pain, or increased pain at the affected locations.   Notify the medical team if any of these develop.  Thank you for the consult. [place] nurse will not follow at this time.   Please re-consult the [place] team if needed.  Cathlean Marseilles Tamala Julian, MSN, RN, [place], [place]le Pearl, [facility] Wound Treatment Associate Pager [phone]

## 2021-02-19 NOTE — Progress Notes (Signed)
  PROGRESS NOTE    Gary [name]  BLT:[id number] DOB: May 12, 1916 DOA: 02/18/2021  PCP: Virgie Dad, MD    LOS - 0   Patient admitted after midnight with jaundice and hyperbilirubinemia.  Enlarged CBD on MRCP.   --Confirmed GI is aware and following.  Full H&P by Dr. Sonia Baller reviewed and I agree with A&P.   No Charge    Ezekiel Slocumb, DO [facility]   If 7PM-7AM, please contact night-coverage [email or web address] 02/19/2021, 8:01 AM

## 2021-02-19 NOTE — Consult Note (Signed)
[facility] Consult: 11:05 AM 02/19/2021  LOS: 0 days    Referring Provider: Nicole Kindred, MD Primary Care Physician:  Virgie Dad, MD Primary Gastroenterologist:  Dr. Verl Blalock.    Reason for Consultation:  choledocholithiasis.     HPI: Gary [name] is a 87 y.o. male.  PMH listed below.  Hard of hearing.  On Eliquis for chronic A. fib, TIA, last dose was the afternoon of 6/16.  Dementia/cognitive impairment.  Lives at [facility].  Wheelchair confined. 1994 colonoscopy.  For surveillance of previous adenomatous colon polyps (1993).  Findings included extensive pandiverticulosis especially in the sigmoid.  No recurrent polyps.  Sore throat last month, tested COVID-19 positive despite vaccination and boosters.  Treated with Paxlovid 5/29 - 6/3.  Several weeks of malaise, weakness, poor appetite.   Staff have noticed jaundice in recent days.  Intermittent nausea and vomiting.  No fevers.  On 6/13 developed stomach upset after eating lunch.  Evolved to abdominal pain and nonbloody emesis.  Also reported right shoulder pain prior to emesis.  Given Phenergan that afternoon which caused excessive sleepiness and difficult to arouse.  T bili 6.2  >> 5.5.  Alkaline phosphatase 220 >> 209.  AST/ALT 79/81 >> 73/78. Potassium 3.3.  BUN/creatinine 46/2.0 CTAP w/o contrast: S/p cholecystectomy.  CBD 8 mm probably due to postsurgical changes.  Slight indistinct appearance of pancreas ?  Mild pancreatitis.  Suspected 18 mm cyst at pancreatic neck.  Colonic diverticulosis.  Nonobstructing left kidney stones. MR abd/MRCP: s/p cholecystectomy.  CBD 10 mm.  Multiple mid and distal CBD stones measuring up to 9 mm.  2 stones in the cystic duct which measure up to 7 mm.  Multiple, nonenhancing pancreatic cysts and  pancreatic body measuring up to 17 mm. CXR: Residual, ill-defined opacities in the right mid-lower lung.  Enlarged cardiac silhouette,?  Collecting cardiomegaly and pericardial effusion.  Patient previously was residing at assisted living but moved to skilled care due to developing inability to transfer from wheelchair to bed.  He is able to mobilize once in the wheelchair.     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   Hyperglycemia 01/02/2013   Hypertrophy of prostate without urinary obstruction and other lower urinary tract symptoms (LUTS) 2002   Insomnia with sleep apnea, unspecified 02/2010   Irritable bowel syndrome 11/14/2011   Long term (current) use of anticoagulants 11/2009   Mild cognitive impairment 04/19/2020   Other abnormal blood chemistry 02/09/2009   Other B-complex deficiencies 2008   Other dyspnea and respiratory abnormality 11/2009   Other malaise and fatigue 2007   Pain  in joint, lower leg 02/2010   Palpitations 2007   Reflux esophagitis 2007   Sebaceous cyst 2008   Tension headache 2009   Undiagnosed cardiac murmurs 2002   Unspecified constipation 07/04/2012   Unspecified essential hypertension 2007   Unspecified hereditary and idiopathic peripheral neuropathy 02/2010   Unspecified transient cerebral ischemia 2002   Urinary frequency 10/01/2012    History reviewed. No pertinent surgical history.  Prior to Admission medications   Medication Sig Start Date End Date Taking? Authorizing Provider  Acetaminophen 500 MG coapsule Take 1 capsule  by mouth at bedtime. And 2 by mouth as needed through out the day for pain   Yes [provider]  apixaban (ELIQUIS) 2.5 MG TABS tablet Take 1 tablet (2.5 mg total) by mouth 2 (two) times daily. Patient taking differently: Take 2.5 mg by mouth 2 (two) times daily. For Afib and stroke prophylaxis 10/22/19  Yes Reed, Tiffany L, DO  doxazosin (CARDURA) 2 MG tablet Take 1 tablet (2 mg total) by mouth daily. 10/22/19  Yes Reed, Tiffany L, DO  Emollient (CERAVE DAILY MOISTURIZING) LOTN Apply topically. Apply to legs/feet before compression socks   Yes [provider]  Eyelid Cleansers (OCUSOFT EYELID CLEANSING) PADS Apply 1 application topically as directed.   Yes [provider]  fluticasone (FLONASE) 50 MCG/ACT nasal spray Place 2 sprays into both nostrils 2 (two) times daily.   Yes [provider]  furosemide (LASIX) 40 MG tablet Take 40 mg by mouth daily.    Yes [provider]  gabapentin (NEURONTIN) 100 MG capsule Take 200 mg by mouth at bedtime.   Yes [provider]  Lactase 9000 units CHEW chew 1 tab (9,000 u) by mouth w/ first bite of milk product PRN lactose intolerant.   Yes [provider]  Levothyroxine Sodium 50 MCG CAPS Take 1 capsule (50 mcg total) by mouth daily before breakfast. 05/30/18  Yes Reed, Tiffany L, DO  Melatonin 3 MG CAPS Take 1 capsule (3 mg total) by mouth at bedtime. 12/13/19  Yes Reed, Tiffany L, DO  metoprolol tartrate (LOPRESSOR) 12.5 mg TABS tablet Take 12.5 mg by mouth as needed (if Heart Rate is =/> 110).   Yes [provider]  mupirocin ointment (BACTROBAN) 2 % Apply 1 application topically as needed. Apply to left buttock skin tag, no more than 2 times a day until healed 08/09/18  Yes [provider]  omeprazole (PRILOSEC OTC) 20 MG tablet Take 20 mg by mouth daily.   Yes [provider]  ondansetron (ZOFRAN) 4 MG tablet Take 4 mg by mouth every 8 (eight) hours as needed for nausea or  vomiting.   Yes [provider]  Polyvinyl Alcohol-Povidone PF 1.4-0.6 % SOLN Apply 1.4 % to eye 2 (two) times daily.    Yes [provider]  potassium chloride 20 MEQ/15ML (10%) SOLN Take 15 mLs by mouth daily.  12/24/17  Yes [provider]  promethazine (PHENERGAN) 25 MG tablet Take 25 mg by mouth every 6 (six) hours as needed for nausea or vomiting (If vomiting persists after 2 doses, notify MD/NP.).   Yes [provider]  trolamine salicylate (ASPERCREME) 10 % cream Apply 1 application topically 2 (two) times daily as needed.   Yes [provider]  vitamin B-12 (CYANOCOBALAMIN) 1000 MCG tablet Take 1,000 mcg by mouth daily.   Yes [provider]    Scheduled Meds:  doxazosin  2 mg Oral Daily   fluticasone  2  spray Each Nare BID   gabapentin  200 mg Oral QHS   levothyroxine  50 mcg Oral QAC breakfast   pantoprazole  40 mg Oral Daily   Infusions:  lactated ringers 100 mL/hr at 02/19/21 0304   PRN Meds: acetaminophen **OR** acetaminophen, melatonin, polyethylene glycol   Allergies as of 02/18/2021 - Review Complete 02/18/2021  Allergen Reaction Noted   Other  01/18/2021   Plavix [clopidogrel bisulfate]  01/02/2013    Family History  Problem Relation Age of Onset   Congestive Heart Failure Mother    Diabetes Father    Heart disease Father    Lung cancer Sister    Kidney disease Sister     Social History   Socioeconomic History   Marital status: Widowed    Spouse name: Not on file   Number of children: Not on file   Years of education: Not on file   Highest education level: Not on file  Occupational History   Occupation: retired   Tobacco Use   Smoking status: Never   Smokeless tobacco: Never  Vaping Use   Vaping Use: Never used  Substance and Sexual Activity   Alcohol use: Yes    Comment: 2oz a week   Drug use: No   Sexual activity: Never  Other Topics Concern   Not on file  Social History Narrative    Lives at [facility] since 02/1992   Widowed   Living Will, DNR   Retired Futures trader with walking, power wheelchair   Exercise: none   Social Determinants of Radio broadcast assistant Strain: Not on file  Food Insecurity: Not on file  Transportation Needs: Not on file  Physical Activity: Not on file  Stress: Not on file  Social Connections: Not on file  Intimate Partner Violence: Not on file    REVIEW OF SYSTEMS: Constitutional: Weakness. ENT:  No nose bleeds Pulm: No cough or shortness of breath. CV:  No palpitations, no LE edema.  No angina. GU:  No hematuria, no frequency GI: See HPI. Heme: No excessive or unusual bleeding or bruising. Transfusions: None. Neuro:  No headaches, no peripheral tingling or numbness.  No syncope, no seizures. Derm:  No itching, no rash or sores.  Endocrine:  No sweats or chills.  No polyuria or dysuria    PHYSICAL EXAM: Vital signs in last 24 hours: Vitals:   02/19/21 0215 02/19/21 0249  BP: (!) 134/56 (!) 133/52  Pulse: (!) 39 64  Resp: 18   Temp: 98.2 F (36.8 C) 97.8 F (36.6 C)  SpO2: 98% 97%   Wt Readings from Last 3 Encounters:  02/18/21 82.8 kg  02/16/21 82.8 kg  01/28/21 83.5 kg    General: Patient looks good for 87 years old.  He is extremely hard of hearing so most of the interview took place between myself and his daughter. Head: No facial asymmetry or swelling.  No signs of head trauma.   Eyes: No scleral icterus.  No conjunctival pallor. Ears: Hard of hearing despite presence of hearing aid. Nose: No discharge or congestion Mouth: Clear but dry oral mucosa.  Tongue midline.  Fair dentition. Neck: No JVD, no masses, no thyromegaly Lungs: Clear bilaterally.  No labored breathing or cough. Heart: irreg/irreg, rate controlled.   with soft murmur.  S1, S2 present Abdomen: Soft.  Active bowel sounds.  Not distended.  No tenderness..   Rectal: Deferred Musc/Skeltl: No obvious joint swelling or  contractures. Extremities: No CCE.  Neurologic: Appropriate but very hard of hearing.  Follows commands.  Upper extremity tremor with exertion, i.e. tremor noted when he was holding onto the bed side rail to assist him leaning forward.  Moves all 4 limbs, strength not tested Skin: Seborrheic keratosis.  No jaundice. Nodes: No cervical adenopathy Psych: Calm, cooperative, pleasant.  Intake/Output from previous day: No intake/output data recorded. Intake/Output this shift: No intake/output data recorded.  LAB RESULTS: Recent Labs    02/18/21 2118 02/19/21 0245  WBC 5.6 5.2  HGB 11.8* 11.5*  HCT 37.0* 35.2*  PLT 145* 146*   BMET Lab Results  Component Value Date   NA 139 02/19/2021   NA 142 02/18/2021   NA 141 09/13/2020   K 3.3 (L) 02/19/2021   K 3.8 02/18/2021   K 3.7 09/13/2020   CL 102 02/19/2021   CL 104 02/18/2021   CL 104 09/13/2020   CO2 26 02/19/2021   CO2 27 02/18/2021   CO2 26 (A) 09/13/2020   GLUCOSE 97 02/19/2021   GLUCOSE 108 (H) 02/18/2021   GLUCOSE 124 (H) 10/23/2016   BUN 46 (H) 02/19/2021   BUN 51 (H) 02/18/2021   BUN 37 (A) 09/13/2020   CREATININE 2.03 (H) 02/19/2021   CREATININE 2.20 (H) 02/18/2021   CREATININE 1.7 (A) 09/13/2020   CALCIUM 8.2 (L) 02/19/2021   CALCIUM 8.1 (L) 02/18/2021   CALCIUM 8.5 (A) 09/13/2020   LFT Recent Labs    02/18/21 2118 02/18/21 2340 02/19/21 0245  PROT 5.7*  --  5.7*  ALBUMIN 2.4*  --  2.3*  AST 79*  --  73*  ALT 81*  --  78*  ALKPHOS 220*  --  209*  BILITOT 6.2*  --  5.5*  BILIDIR  --  3.4* 3.6*  IBILI  --   --  1.9*   PT/INR Lab Results  Component Value Date   INR 1.4 (H) 02/19/2021   INR 1.32 10/23/2016   Hepatitis Panel Recent Labs    02/18/21 2340  HEPBSAG NON REACTIVE  HCVAB NON REACTIVE  HEPAIGM NON REACTIVE  HEPBIGM NON REACTIVE   C-Diff No components found for: CDIFF Lipase     Component Value Date/Time   LIPASE 56 (H) 02/18/2021 2118    Drugs of Abuse  No results found  for: LABOPIA, COCAINSCRNUR, LABBENZ, AMPHETMU, THCU, LABBARB   RADIOLOGY STUDIES: CT ABDOMEN PELVIS WO CONTRAST  Result Date: 02/19/2021 CLINICAL DATA:  Abdomen distension abnormal labs elevated bilirubin EXAM: CT ABDOMEN AND PELVIS WITHOUT CONTRAST TECHNIQUE: Multidetector CT imaging of the abdomen and pelvis was performed following the standard protocol without IV contrast. COMPARISON:  None. FINDINGS: Lower chest: Lung bases demonstrate trace left pleural effusion. Cardiomegaly with trace pericardial effusion. Mitral calcification. Coronary vascular calcification. Hepatobiliary: No focal hepatic abnormality. Status post cholecystectomy. Extrahepatic common bile duct up to 8 mm. Pancreas: Possible mild peripancreatic fat stranding. No ductal dilatation. Suspected 18 mm cyst at the pancreatic neck, series 3, image 30. Spleen: Normal in size without focal abnormality. Adrenals/Urinary Tract: Adrenal glands are normal. Kidneys slightly atrophic. No hydronephrosis. Punctate stones lower pole left kidney. Distended urinary bladder. Stomach/Bowel: Stomach nonenlarged. No dilated small bowel. Diverticular disease of the colon without acute wall thickening. Vascular/Lymphatic: Advanced aortic atherosclerosis. No aneurysm. No suspicious nodes. Reproductive: Enlarged prostate with calcification Other: Mild presacral edema and stranding. No significant pelvic effusion or ascites. Right lateral abdominal wall laxity with bulging of fat and bowel. Musculoskeletal: Degenerative changes. No acute or suspicious osseous abnormality IMPRESSION: 1. Status  post cholecystectomy. No intra hepatic biliary dilatation. Slightly enlarged extrahepatic common bile duct, probably due to postsurgical change. Follow-up MRCP if ductal obstruction remains a concern. 2. Slightly indistinct appearance of pancreas, question mild pancreatitis, correlate with appropriate enzymes. Suspected 18 mm cyst at the pancreatic neck 3. Nonobstructing  stones in the left kidney 4. Diverticular disease of the colon without acute inflammation Electronically Signed   By: Donavan Foil M.D.   On: 02/19/2021 00:05   DG Chest 1 View  Result Date: 02/19/2021 CLINICAL DATA:  Pneumonia EXAM: CHEST  1 VIEW COMPARISON:  CT abdomen and pelvis 02/18/2021, chest radiograph 10/23/2016 FINDINGS: Some residual patchy opacities are present in the right mid to lower lung. No visible layering effusion or pneumothorax. Pulmonary vascularity is normally distributed. Cardiomegaly is similar to prior with a calcified, tortuous aorta. Slight rightward mediastinal shift with tracheal tortuosity is also stable. Degenerative changes are present in the imaged spine and shoulders. Telemetry leads overlie the chest. IMPRESSION: 1. Some residual ill-defined opacities present in the right mid to lower lung may be atelectatic or related to infectious or inflammatory consolidation. 2. Enlarged cardiac silhouette may reflect a combination of cardiomegaly and pericardial effusion seen on comparison CT imaging. 3. Aortic Atherosclerosis (ICD10-I70.0). Electronically Signed   By: Lovena Le M.D.   On: 02/19/2021 02:36   MR ABDOMEN MRCP W WO CONTAST  Result Date: 02/19/2021 CLINICAL DATA:  Dilated LFTs EXAM: MRI ABDOMEN WITHOUT AND WITH CONTRAST (INCLUDING MRCP) TECHNIQUE: Multiplanar multisequence MR imaging of the abdomen was performed both before and after the administration of intravenous contrast. Heavily T2-weighted images of the biliary and pancreatic ducts were obtained, and three-dimensional MRCP images were rendered by post processing. CONTRAST:  75mL GADAVIST GADOBUTROL 1 MMOL/ML IV SOLN COMPARISON:  CT abdomen/pelvis dated 02/18/2021 FINDINGS: Lower chest: Scarring/atelectasis at the left lung base. Cardiomegaly. Hepatobiliary: Liver is within normal limits. No hepatic steatosis. No suspicious/enhancing hepatic lesions. Status post cholecystectomy. No intrahepatic ductal dilatation.  Dilated common duct, measuring 10 mm (series 11/image 20), with at least four mid/distal CBD stones (series 11/images 18-19) measuring up to 9 mm (series 7/image 16). Two additional cystic duct remnant stones measuring up to 7 mm (series 7/image 23). Pancreas: Multiple nonenhancing pancreatic cystic lesions, including a dominant 17 mm cyst in the pancreatic body (series 7/image 22). No enhancement following contrast administration. No main pancreatic ductal dilatation. Spleen:  Within normal limits. Adrenals/Urinary Tract:  Adrenal glands are within normal limits. 5 mm hemorrhagic cyst in the left upper kidney (series 1300/image 32). 13 mm right lower pole renal cyst (series 7/image 27). No enhancing renal lesions. No hydronephrosis. Stomach/Bowel: Stomach is within normal limits. Visualized bowel is notable for left colonic diverticulosis, without evidence of diverticulitis. Vascular/Lymphatic:  No evidence of abdominal aortic aneurysm. No suspicious abdominal lymphadenopathy. Other:  No abdominal ascites. Musculoskeletal: No focal osseous lesions. IMPRESSION: Status post cholecystectomy. Mildly dilated common duct, measuring 10 mm. Choledocholithiasis with multiple mid/distal CBD stones measuring up to 9 mm and two cystic duct remnant stones measuring up to 7 mm, as above. Multiple nonenhancing pancreatic cysts, measuring up to 17 mm in the pancreatic body. These likely reflect benign pseudocysts or side branch IPMNs. Given the patient's age, routine follow-up is not recommended. Electronically Signed   By: Julian Hy M.D.   On: 02/19/2021 10:41      IMPRESSION:       Symptomatic choledocholithiasis.     Pancreatic cysts likely benign pseudocyst or sidebranch IPMN's.  Chronic Eliquis.  Indication A. fib and history of TIA.  Latest dose was 6/16 in afternoon.  This is not on hold     AKI versus progression of CKD.    Hypokalemia.       Breakthrough COVID infection 5/29.  Treated with  paxlovid 5/29-6/3.  Remains on contact precautions.  Repeat COVID testing positive on 6/17, this is not surprising.   PLAN:        Discontinued Eliquis.      Discuss potential ERCP w Dr. Rush Landmark.   The soonest any ERCP could be pursued would be 6/18 to allow for Eliquis.  Hospitalist ordered heart healthy diet.  If he does not tolerate this then would switch to clear liquids.     Daughter and [facility] POA is [employer].  [phone].   She was in the room during the encounter.  Discussed briefly the MRCP findings of stones in the bile duct and potential intervention for this in the next day or 2.  Detailed description and risk benefits of ERCP were not discussed with Ms. Gary [name]   Azucena Freed  02/19/2021, 11:05 AM Phone [phone]

## 2021-02-20 DIAGNOSIS — F0390 Unspecified dementia without behavioral disturbance: Secondary | ICD-10-CM | POA: Diagnosis present

## 2021-02-20 DIAGNOSIS — N179 Acute kidney failure, unspecified: Secondary | ICD-10-CM | POA: Diagnosis present

## 2021-02-20 DIAGNOSIS — Z8673 Personal history of transient ischemic attack (TIA), and cerebral infarction without residual deficits: Secondary | ICD-10-CM | POA: Diagnosis not present

## 2021-02-20 DIAGNOSIS — Z79899 Other long term (current) drug therapy: Secondary | ICD-10-CM | POA: Diagnosis not present

## 2021-02-20 DIAGNOSIS — E876 Hypokalemia: Secondary | ICD-10-CM | POA: Diagnosis present

## 2021-02-20 DIAGNOSIS — I482 Chronic atrial fibrillation, unspecified: Secondary | ICD-10-CM | POA: Diagnosis present

## 2021-02-20 DIAGNOSIS — K8050 Calculus of bile duct without cholangitis or cholecystitis without obstruction: Secondary | ICD-10-CM | POA: Diagnosis present

## 2021-02-20 DIAGNOSIS — Z7901 Long term (current) use of anticoagulants: Secondary | ICD-10-CM | POA: Diagnosis not present

## 2021-02-20 DIAGNOSIS — Z801 Family history of malignant neoplasm of trachea, bronchus and lung: Secondary | ICD-10-CM | POA: Diagnosis not present

## 2021-02-20 DIAGNOSIS — K831 Obstruction of bile duct: Secondary | ICD-10-CM | POA: Diagnosis not present

## 2021-02-20 DIAGNOSIS — R17 Unspecified jaundice: Secondary | ICD-10-CM | POA: Diagnosis not present

## 2021-02-20 DIAGNOSIS — Z66 Do not resuscitate: Secondary | ICD-10-CM | POA: Diagnosis present

## 2021-02-20 DIAGNOSIS — Z8249 Family history of ischemic heart disease and other diseases of the circulatory system: Secondary | ICD-10-CM | POA: Diagnosis not present

## 2021-02-20 DIAGNOSIS — I959 Hypotension, unspecified: Secondary | ICD-10-CM | POA: Diagnosis not present

## 2021-02-20 DIAGNOSIS — E039 Hypothyroidism, unspecified: Secondary | ICD-10-CM | POA: Diagnosis present

## 2021-02-20 DIAGNOSIS — N1832 Chronic kidney disease, stage 3b: Secondary | ICD-10-CM | POA: Diagnosis present

## 2021-02-20 DIAGNOSIS — Z8616 Personal history of COVID-19: Secondary | ICD-10-CM | POA: Diagnosis not present

## 2021-02-20 DIAGNOSIS — R531 Weakness: Secondary | ICD-10-CM | POA: Diagnosis not present

## 2021-02-20 DIAGNOSIS — Z7401 Bed confinement status: Secondary | ICD-10-CM | POA: Diagnosis not present

## 2021-02-20 DIAGNOSIS — I700 Atherosclerosis of aorta: Secondary | ICD-10-CM | POA: Diagnosis not present

## 2021-02-20 DIAGNOSIS — L89159 Pressure ulcer of sacral region, unspecified stage: Secondary | ICD-10-CM | POA: Diagnosis present

## 2021-02-20 DIAGNOSIS — R5381 Other malaise: Secondary | ICD-10-CM | POA: Diagnosis not present

## 2021-02-20 DIAGNOSIS — K838 Other specified diseases of biliary tract: Secondary | ICD-10-CM | POA: Diagnosis not present

## 2021-02-20 DIAGNOSIS — G8100 Flaccid hemiplegia affecting unspecified side: Secondary | ICD-10-CM | POA: Diagnosis not present

## 2021-02-20 DIAGNOSIS — I499 Cardiac arrhythmia, unspecified: Secondary | ICD-10-CM | POA: Diagnosis not present

## 2021-02-20 DIAGNOSIS — I1310 Hypertensive heart and chronic kidney disease without heart failure, with stage 1 through stage 4 chronic kidney disease, or unspecified chronic kidney disease: Secondary | ICD-10-CM | POA: Diagnosis present

## 2021-02-20 DIAGNOSIS — Z9049 Acquired absence of other specified parts of digestive tract: Secondary | ICD-10-CM | POA: Diagnosis not present

## 2021-02-20 DIAGNOSIS — K219 Gastro-esophageal reflux disease without esophagitis: Secondary | ICD-10-CM | POA: Diagnosis not present

## 2021-02-20 DIAGNOSIS — Z993 Dependence on wheelchair: Secondary | ICD-10-CM | POA: Diagnosis not present

## 2021-02-20 DIAGNOSIS — Z833 Family history of diabetes mellitus: Secondary | ICD-10-CM | POA: Diagnosis not present

## 2021-02-20 DIAGNOSIS — G8101 Flaccid hemiplegia affecting right dominant side: Secondary | ICD-10-CM | POA: Diagnosis present

## 2021-02-20 DIAGNOSIS — N400 Enlarged prostate without lower urinary tract symptoms: Secondary | ICD-10-CM | POA: Diagnosis present

## 2021-02-20 LAB — CBC
HCT: 37.2 % — ABNORMAL LOW (ref 39.0–52.0)
Hemoglobin: 12.1 g/dL — ABNORMAL LOW (ref 13.0–17.0)
MCH: 31.4 pg (ref 26.0–34.0)
MCHC: 32.5 g/dL (ref 30.0–36.0)
MCV: 96.6 fL (ref 80.0–100.0)
Platelets: 151 10*3/uL (ref 150–400)
RBC: 3.85 MIL/uL — ABNORMAL LOW (ref 4.22–5.81)
RDW: 14 % (ref 11.5–15.5)
WBC: 6 10*3/uL (ref 4.0–10.5)
nRBC: 0 % (ref 0.0–0.2)

## 2021-02-20 LAB — COMPREHENSIVE METABOLIC PANEL
ALT: 59 U/L — ABNORMAL HIGH (ref 0–44)
AST: 51 U/L — ABNORMAL HIGH (ref 15–41)
Albumin: 2.2 g/dL — ABNORMAL LOW (ref 3.5–5.0)
Alkaline Phosphatase: 213 U/L — ABNORMAL HIGH (ref 38–126)
Anion gap: 9 (ref 5–15)
BUN: 38 mg/dL — ABNORMAL HIGH (ref 8–23)
CO2: 23 mmol/L (ref 22–32)
Calcium: 8.1 mg/dL — ABNORMAL LOW (ref 8.9–10.3)
Chloride: 108 mmol/L (ref 98–111)
Creatinine, Ser: 1.78 mg/dL — ABNORMAL HIGH (ref 0.61–1.24)
GFR, Estimated: 33 mL/min — ABNORMAL LOW (ref >60)
Glucose, Bld: 93 mg/dL (ref 70–99)
Potassium: 3.4 mmol/L — ABNORMAL LOW (ref 3.5–5.1)
Sodium: 140 mmol/L (ref 135–145)
Total Bilirubin: 5.5 mg/dL — ABNORMAL HIGH (ref 0.3–1.2)
Total Protein: 5.4 g/dL — ABNORMAL LOW (ref 6.5–8.1)

## 2021-02-20 LAB — MRSA PCR SCREENING: MRSA by PCR: NEGATIVE

## 2021-02-20 MED ORDER — POTASSIUM CHLORIDE CRYS ER 20 MEQ PO TBCR
40.0000 meq | EXTENDED_RELEASE_TABLET | Freq: Once | ORAL | Status: AC
  Administered 2021-02-20: 40 meq via ORAL
  Filled 2021-02-20: qty 2

## 2021-02-20 MED ORDER — MAGNESIUM SULFATE IN D5W 1-5 GM/100ML-% IV SOLN
1.0000 g | Freq: Once | INTRAVENOUS | Status: AC
  Administered 2021-02-20: 1 g via INTRAVENOUS
  Filled 2021-02-20: qty 100

## 2021-02-20 NOTE — Progress Notes (Signed)
Gastroenterology Inpatient Follow-up Note   PATIENT IDENTIFICATION  Gary [name] is a 87 y.o. male  Hospital Day: 3  SUBJECTIVE  The patient and daughter are seen at the bedside today. He has come off COVID-19 infection protocol isolation. Patient states he has no significant abdominal pain at this time. His urine remains dark. No fevers or chills are noted.   OBJECTIVE  Scheduled Inpatient Medications:   doxazosin  2 mg Oral Daily   fluticasone  2 spray Each Nare BID   gabapentin  200 mg Oral QHS   levothyroxine  50 mcg Oral QAC breakfast   pantoprazole  40 mg Oral Daily   Continuous Inpatient Infusions:  PRN Inpatient Medications: acetaminophen **OR** acetaminophen, melatonin, polyethylene glycol   Physical Examination  Temp:  [98.2 F (36.8 C)-98.4 F (36.9 C)] 98.4 F (36.9 C) (06/18 0425) Pulse Rate:  [56-58] 58 (06/18 0425) Resp:  [18] 18 (06/18 0425) BP: (117-122)/(61-64) 117/64 (06/18 0425) SpO2:  [98 %] 98 % (06/18 0425) Temp (24hrs), Avg:98.3 F (36.8 C), Min:98.2 F (36.8 C), Max:98.4 F (36.9 C)  Weight: 82.8 kg GEN: NAD, appears younger than stated age, doesn't appear chronically ill PSYCH: Cooperative, without pressured speech EYE: Sclerae icteric ENT: MMM CV: Nontachycardic RESP: Decreased breath sounds at the bases bilaterally GI: NABS, soft, NT/ND, without rebound MSK/EXT: Lower extremity edema present SKIN: No jaundice NEURO:  Alert & Oriented x 3, no focal deficits   Review of Data   Laboratory Studies   Recent Labs  Lab 02/19/21 0245 02/20/21 0745  NA 139 140  K 3.3* 3.4*  CL 102 108  CO2 26 23  BUN 46* 38*  CREATININE 2.03* 1.78*  GLUCOSE 97 93  CALCIUM 8.2* 8.1*  MG 1.8  --    Recent Labs  Lab 02/20/21 0745  AST 51*  ALT 59*  ALKPHOS 213*    Recent Labs  Lab 02/18/21 2118 02/19/21 0245 02/20/21 0745  WBC 5.6 5.2 6.0  HGB 11.8* 11.5* 12.1*  HCT 37.0* 35.2* 37.2*  PLT 145* 146* 151   Recent Labs  Lab  02/19/21 0245  APTT 34  INR 1.4*    Imaging Studies  CT ABDOMEN PELVIS WO CONTRAST  Result Date: 02/19/2021 CLINICAL DATA:  Abdomen distension abnormal labs elevated bilirubin EXAM: CT ABDOMEN AND PELVIS WITHOUT CONTRAST TECHNIQUE: Multidetector CT imaging of the abdomen and pelvis was performed following the standard protocol without IV contrast. COMPARISON:  None. FINDINGS: Lower chest: Lung bases demonstrate trace left pleural effusion. Cardiomegaly with trace pericardial effusion. Mitral calcification. Coronary vascular calcification. Hepatobiliary: No focal hepatic abnormality. Status post cholecystectomy. Extrahepatic common bile duct up to 8 mm. Pancreas: Possible mild peripancreatic fat stranding. No ductal dilatation. Suspected 18 mm cyst at the pancreatic neck, series 3, image 30. Spleen: Normal in size without focal abnormality. Adrenals/Urinary Tract: Adrenal glands are normal. Kidneys slightly atrophic. No hydronephrosis. Punctate stones lower pole left kidney. Distended urinary bladder. Stomach/Bowel: Stomach nonenlarged. No dilated small bowel. Diverticular disease of the colon without acute wall thickening. Vascular/Lymphatic: Advanced aortic atherosclerosis. No aneurysm. No suspicious nodes. Reproductive: Enlarged prostate with calcification Other: Mild presacral edema and stranding. No significant pelvic effusion or ascites. Right lateral abdominal wall laxity with bulging of fat and bowel. Musculoskeletal: Degenerative changes. No acute or suspicious osseous abnormality IMPRESSION: 1. Status post cholecystectomy. No intra hepatic biliary dilatation. Slightly enlarged extrahepatic common bile duct, probably due to postsurgical change. Follow-up MRCP if ductal obstruction remains a concern. 2. Slightly indistinct appearance  of pancreas, question mild pancreatitis, correlate with appropriate enzymes. Suspected 18 mm cyst at the pancreatic neck 3. Nonobstructing stones in the left kidney 4.  Diverticular disease of the colon without acute inflammation Electronically Signed   By: Donavan Foil M.D.   On: 02/19/2021 00:05   DG Chest 1 View  Result Date: 02/19/2021 CLINICAL DATA:  Pneumonia EXAM: CHEST  1 VIEW COMPARISON:  CT abdomen and pelvis 02/18/2021, chest radiograph 10/23/2016 FINDINGS: Some residual patchy opacities are present in the right mid to lower lung. No visible layering effusion or pneumothorax. Pulmonary vascularity is normally distributed. Cardiomegaly is similar to prior with a calcified, tortuous aorta. Slight rightward mediastinal shift with tracheal tortuosity is also stable. Degenerative changes are present in the imaged spine and shoulders. Telemetry leads overlie the chest. IMPRESSION: 1. Some residual ill-defined opacities present in the right mid to lower lung may be atelectatic or related to infectious or inflammatory consolidation. 2. Enlarged cardiac silhouette may reflect a combination of cardiomegaly and pericardial effusion seen on comparison CT imaging. 3. Aortic Atherosclerosis (ICD10-I70.0). Electronically Signed   By: Lovena Le M.D.   On: 02/19/2021 02:36   MR ABDOMEN MRCP W WO CONTAST  Result Date: 02/19/2021 CLINICAL DATA:  Dilated LFTs EXAM: MRI ABDOMEN WITHOUT AND WITH CONTRAST (INCLUDING MRCP) TECHNIQUE: Multiplanar multisequence MR imaging of the abdomen was performed both before and after the administration of intravenous contrast. Heavily T2-weighted images of the biliary and pancreatic ducts were obtained, and three-dimensional MRCP images were rendered by post processing. CONTRAST:  16mL GADAVIST GADOBUTROL 1 MMOL/ML IV SOLN COMPARISON:  CT abdomen/pelvis dated 02/18/2021 FINDINGS: Lower chest: Scarring/atelectasis at the left lung base. Cardiomegaly. Hepatobiliary: Liver is within normal limits. No hepatic steatosis. No suspicious/enhancing hepatic lesions. Status post cholecystectomy. No intrahepatic ductal dilatation. Dilated common duct,  measuring 10 mm (series 11/image 20), with at least four mid/distal CBD stones (series 11/images 18-19) measuring up to 9 mm (series 7/image 16). Two additional cystic duct remnant stones measuring up to 7 mm (series 7/image 23). Pancreas: Multiple nonenhancing pancreatic cystic lesions, including a dominant 17 mm cyst in the pancreatic body (series 7/image 22). No enhancement following contrast administration. No main pancreatic ductal dilatation. Spleen:  Within normal limits. Adrenals/Urinary Tract:  Adrenal glands are within normal limits. 5 mm hemorrhagic cyst in the left upper kidney (series 1300/image 32). 13 mm right lower pole renal cyst (series 7/image 27). No enhancing renal lesions. No hydronephrosis. Stomach/Bowel: Stomach is within normal limits. Visualized bowel is notable for left colonic diverticulosis, without evidence of diverticulitis. Vascular/Lymphatic:  No evidence of abdominal aortic aneurysm. No suspicious abdominal lymphadenopathy. Other:  No abdominal ascites. Musculoskeletal: No focal osseous lesions. IMPRESSION: Status post cholecystectomy. Mildly dilated common duct, measuring 10 mm. Choledocholithiasis with multiple mid/distal CBD stones measuring up to 9 mm and two cystic duct remnant stones measuring up to 7 mm, as above. Multiple nonenhancing pancreatic cysts, measuring up to 17 mm in the pancreatic body. These likely reflect benign pseudocysts or side branch IPMNs. Given the patient's age, routine follow-up is not recommended. Electronically Signed   By: Julian Hy M.D.   On: 02/19/2021 10:41    GI Procedures and Studies  No relevant studies to review   ASSESSMENT  Mr. Savino is a 87 y.o. male with a pmh significant for atrial fibrillation, chronic renal insufficiency, BPH, arthritis, GERD, status postcholecystectomy, choledocholithiasis found now.  Patient is hemodynamically stable.  Clinically he also is doing well.  LFT pattern remains elevated.  Patient and  daughter were not aware that an ERCP attempt would require general anesthesia and this concerns them.  Extensive discussion with the patient and daughter today to discuss ERCP and its role and indication.  I believe the patient is symptomatic even though he is not feeling unwell he has evidence of obstruction and jaundice and thus is at risk of cholangitis or pancreatitis in the future.  If the patient and family do not want to have general anesthesia, then would recommend interventional radiology consultation to discuss potential PBD placement that would not require general anesthesia but may not be the most optimal means of treatment either.  We will respect their wishes as many risks her with ERCP and as patients become older those risks increase.  We will still work on obtaining an ERCP slot for tomorrow while he washes out from his blood thinner.  The patient and daughter will decide by this afternoon how they would like to proceed.  The risks of an ERCP were discussed at length, including but not limited to the risk of perforation, bleeding, abdominal pain, post-ERCP pancreatitis (while usually mild can be severe and even life threatening). All patient questions were answered to the best of my ability, and the patient agrees to the aforementioned plan of action with follow-up as indicated.   PLAN/RECOMMENDATIONS  N.p.o. at midnight Tentative ERCP on 6/19 based on anesthesia availability Continue to hold blood thinner If VTE prophylaxis is required okay until tomorrow morning Patient and daughter will make final decision as to whether to undergo ERCP tomorrow Medical optimization as per medical service should patient undergo ERCP If patient and daughter decline ERCP attempt, then recommend interventional radiology consult to discuss potential PBD that may not require general anesthesia (though would have to be confirmed)   Please page/call with questions or concerns.   Justice Britain,  MD [facility] Office # [phone]    LOS: 0 days  Irving Copas  02/20/2021, 12:00 PM

## 2021-02-20 NOTE — Progress Notes (Signed)
PROGRESS NOTE    Gary [name]   FTD:[id number]  DOB: Nov 04, 1915  PCP: Virgie Dad, MD    DOA: 02/18/2021 LOS: 0   Assessment & Plan   Principal Problem:   Jaundice Active Problems:   Atrial fibrillation, chronic (HCC)   Essential hypertension, benign   Chronic kidney disease, stage 3b (HCC)   Goals of care, counseling/discussion   BPH (benign prostatic hyperplasia)   Hypothyroidism   GERD without esophagitis   Prolonged Q-T interval on ECG   Pressure injury of skin of coccygeal region   Choledocholithiasis   Choledocholithiasis -Dr. Minerva Areola consulted.   Tentative plan for ERCP tomorrow, but patient and daughter are discussing this together given the increased risk is with his advanced age. LFTs remain elevated, T bili slightly improved. --See GI recommendations --Follow CMP --Hold anticoagulation --Follow-up on patient and daughters decision about procedure   Chronic Atrial Fibrillation - Continue Eliquis.  Rate controlled.  Tele.  CKD stage 3b - renal function near baseline, mild elevation not meeting AKI criteria on admission. --gentle fluids, poor PO intake --strict I/O's --follow BMP  BPH - continue Cardura  Hypothyroidism - continue Synthroid  Prolonged Qtc - on admission EKG.  Tele.  Goal K>4, Mg>2.  GERD - continue PPI  Goals of care - per Dr. Darrick Grinder H&P, discussion had on ion.  "Considering patient's advanced age and comorbidities I explained to the patient that any intensive or invasive intervention would come with extremely high risk Patient is voiced that he would prefer not to pursue any invasive intervention/surgical procedures.  Patient wishes to undergo an invasive work-up of his abnormal liver enzymes at this time I would also like to have the opportunity to speak to gastroenterology concerning his diagnosis Patient has confirmed that he is a DNR"   Patient BMI: Body mass index is 26.96 kg/m.   DVT prophylaxis:    Diet:   Diet Orders (From admission, onward)     Start     Ordered   02/21/21 0001  Diet NPO time specified  Diet effective ____        02/19/21 1257   02/19/21 0205  Diet Heart Room service appropriate? Yes; Fluid consistency: Thin  Diet effective now       Question Answer Comment  Room service appropriate? Yes   Fluid consistency: Thin      02/19/21 0207              Code Status: DNR   Brief Narrative / Hospital Course to Date:   87 y.o. male admitted on 02/19/21 after presenting from SNF with jaundice and abnormal LFT's.  He reported several week history of general malaise and weakness, poor appetite which attributed to having Covid-19 over a month ago.    Found to have choledocholithiasis.  GI consulted and plan for ERCP on Sunday, if patient decides to proceed.  He and daughter are discussing after speaking with GI today.  Subjective 02/20/21    Patient seen with daughter at bedside this morning.  He reports feeling generally unwell with some mild chest discomfort.  Denies any nausea vomiting or significant abdominal pain.  They want to discuss risks and benefits of the ERCP given his age, but he is interested in proceeding but wants to hear about risk benefits from GI before making that decision.   Disposition Plan & Communication   Status is: Inpatient  Remains inpatient appropriate because:Ongoing diagnostic testing needed not appropriate for outpatient work up  Dispo: The  patient is from: SNF              Anticipated d/c is to: SNF              Patient currently is not medically stable to d/c.   Difficult to place patient No   Family Communication: Daughter at bedside on rounds today 6/18   Consults, Procedures, Significant Events   Consultants:  Gastroenterology, Dr. Rush Landmark  Procedures:  None, ERCP planned  Antimicrobials:  Anti-infectives (From admission, onward)    None         Micro    Objective   Vitals:   02/19/21 2026 02/20/21 0425  02/20/21 1251 02/20/21 1724  BP: 122/61 117/64 (!) 116/49 135/60  Pulse: (!) 56 (!) 58 (!) 43 (!) 40  Resp: 18 18 15 17   Temp: 98.2 F (36.8 C) 98.4 F (36.9 C) 97.7 F (36.5 C) 98 F (36.7 C)  TempSrc: Oral Oral Oral Oral  SpO2: 98% 98% 98% 97%  Weight:      Height:       No intake or output data in the 24 hours ending 02/20/21 1815 Filed Weights   02/18/21 2108  Weight: 82.8 kg    Physical Exam:  General exam: awake, alert, no acute distress HEENT: Scleral icterus, clear conjunctiva, hard of hearing, moist mucus membranes Respiratory system: CTAB, no wheezes, rales or rhonchi, normal respiratory effort. Cardiovascular system: normal S1/S2, RRR, no pedal edema.   Gastrointestinal system: Mild distention, nontender, right upper quadrant surgical scar with hernia,, +bowel sounds. Central nervous system: A&O x3. no gross focal neurologic deficits, normal speech Extremities: moves all, no edema, normal tone Skin: Jaundice dry, intact, normal temperature Psychiatry: normal mood, congruent affect, judgement and insight appear normal  Labs   Data Reviewed: I have personally reviewed following labs and imaging studies  CBC: Recent Labs  Lab 02/18/21 2118 02/19/21 0245 02/20/21 0745  WBC 5.6 5.2 6.0  NEUTROABS  --  4.0  --   HGB 11.8* 11.5* 12.1*  HCT 37.0* 35.2* 37.2*  MCV 98.7 97.2 96.6  PLT 145* 146* 384   Basic Metabolic Panel: Recent Labs  Lab 02/18/21 2118 02/19/21 0245 02/20/21 0745  NA 142 139 140  K 3.8 3.3* 3.4*  CL 104 102 108  CO2 27 26 23   GLUCOSE 108* 97 93  BUN 51* 46* 38*  CREATININE 2.20* 2.03* 1.78*  CALCIUM 8.1* 8.2* 8.1*  MG 2.1 1.8  --    GFR: Estimated Creatinine Clearance: 21 mL/min (A) (by C-G formula based on SCr of 1.78 mg/dL (H)). Liver Function Tests: Recent Labs  Lab 02/18/21 2118 02/19/21 0245 02/20/21 0745  AST 79* 73* 51*  ALT 81* 78* 59*  ALKPHOS 220* 209* 213*  BILITOT 6.2* 5.5* 5.5*  PROT 5.7* 5.7* 5.4*  ALBUMIN  2.4* 2.3* 2.2*   Recent Labs  Lab 02/18/21 2118  LIPASE 56*   Recent Labs  Lab 02/19/21 0139  AMMONIA 23   Coagulation Profile: Recent Labs  Lab 02/19/21 0245  INR 1.4*   Cardiac Enzymes: No results for input(s): CKTOTAL, CKMB, CKMBINDEX, TROPONINI in the last 168 hours. BNP (last 3 results) No results for input(s): PROBNP in the last 8760 hours. HbA1C: No results for input(s): HGBA1C in the last 72 hours. CBG: No results for input(s): GLUCAP in the last 168 hours. Lipid Profile: No results for input(s): CHOL, HDL, LDLCALC, TRIG, CHOLHDL, LDLDIRECT in the last 72 hours. Thyroid Function Tests: No results for input(s): TSH,  T4TOTAL, FREET4, T3FREE, THYROIDAB in the last 72 hours. Anemia Panel: No results for input(s): VITAMINB12, FOLATE, FERRITIN, TIBC, IRON, RETICCTPCT in the last 72 hours. Sepsis Labs: Recent Labs  Lab 02/18/21 2119  LATICACIDVEN 1.2    Recent Results (from the past 240 hour(s))  SARS CORONAVIRUS 2 (TAT 6-24 HRS) Nasopharyngeal Nasopharyngeal Swab     Status: Abnormal   Collection Time: 02/19/21  1:03 AM   Specimen: Nasopharyngeal Swab  Result Value Ref Range Status   SARS Coronavirus 2 POSITIVE (A) NEGATIVE Final    Comment: (NOTE) SARS-CoV-2 target nucleic acids are DETECTED.  The SARS-CoV-2 RNA is generally detectable in upper and lower respiratory specimens during the acute phase of infection. Positive results are indicative of the presence of SARS-CoV-2 RNA. Clinical correlation with patient history and other diagnostic information is  necessary to determine patient infection status. Positive results do not rule out bacterial infection or co-infection with other viruses.  The expected result is Negative.  Fact Sheet for Patients: SugarRoll.be  Fact Sheet for Healthcare Providers: https://www.woods-mathews.com/  This test is not yet approved or cleared by the [place] FDA and  has been  authorized for detection and/or diagnosis of SARS-CoV-2 by FDA under an Emergency Use Authorization (EUA). This EUA will remain  in effect (meaning this test can be used) for the duration of the COVID-19 declaration under Section 564(b)(1) of the Act, 21 U. S.C. section 360bbb-3(b)(1), unless the authorization is terminated or revoked sooner.   Performed at [facility], [place] [address] [place], [place] [address]       Imaging Studies   CT ABDOMEN PELVIS WO CONTRAST  Result Date: 02/19/2021 CLINICAL DATA:  Abdomen distension abnormal labs elevated bilirubin EXAM: CT ABDOMEN AND PELVIS WITHOUT CONTRAST TECHNIQUE: Multidetector CT imaging of the abdomen and pelvis was performed following the standard protocol without IV contrast. COMPARISON:  None. FINDINGS: Lower chest: Lung bases demonstrate trace left pleural effusion. Cardiomegaly with trace pericardial effusion. Mitral calcification. Coronary vascular calcification. Hepatobiliary: No focal hepatic abnormality. Status post cholecystectomy. Extrahepatic common bile duct up to 8 mm. Pancreas: Possible mild peripancreatic fat stranding. No ductal dilatation. Suspected 18 mm cyst at the pancreatic neck, series 3, image 30. Spleen: Normal in size without focal abnormality. Adrenals/Urinary Tract: Adrenal glands are normal. Kidneys slightly atrophic. No hydronephrosis. Punctate stones lower pole left kidney. Distended urinary bladder. Stomach/Bowel: Stomach nonenlarged. No dilated small bowel. Diverticular disease of the colon without acute wall thickening. Vascular/Lymphatic: Advanced aortic atherosclerosis. No aneurysm. No suspicious nodes. Reproductive: Enlarged prostate with calcification Other: Mild presacral edema and stranding. No significant pelvic effusion or ascites. Right lateral abdominal wall laxity with bulging of fat and bowel. Musculoskeletal: Degenerative changes. No acute or suspicious osseous abnormality IMPRESSION: 1. Status  post cholecystectomy. No intra hepatic biliary dilatation. Slightly enlarged extrahepatic common bile duct, probably due to postsurgical change. Follow-up MRCP if ductal obstruction remains a concern. 2. Slightly indistinct appearance of pancreas, question mild pancreatitis, correlate with appropriate enzymes. Suspected 18 mm cyst at the pancreatic neck 3. Nonobstructing stones in the left kidney 4. Diverticular disease of the colon without acute inflammation Electronically Signed   By: Donavan Foil M.D.   On: 02/19/2021 00:05   DG Chest 1 View  Result Date: 02/19/2021 CLINICAL DATA:  Pneumonia EXAM: CHEST  1 VIEW COMPARISON:  CT abdomen and pelvis 02/18/2021, chest radiograph 10/23/2016 FINDINGS: Some residual patchy opacities are present in the right mid to lower lung. No visible layering effusion or pneumothorax.  Pulmonary vascularity is normally distributed. Cardiomegaly is similar to prior with a calcified, tortuous aorta. Slight rightward mediastinal shift with tracheal tortuosity is also stable. Degenerative changes are present in the imaged spine and shoulders. Telemetry leads overlie the chest. IMPRESSION: 1. Some residual ill-defined opacities present in the right mid to lower lung may be atelectatic or related to infectious or inflammatory consolidation. 2. Enlarged cardiac silhouette may reflect a combination of cardiomegaly and pericardial effusion seen on comparison CT imaging. 3. Aortic Atherosclerosis (ICD10-I70.0). Electronically Signed   By: Lovena Le M.D.   On: 02/19/2021 02:36   MR ABDOMEN MRCP W WO CONTAST  Result Date: 02/19/2021 CLINICAL DATA:  Dilated LFTs EXAM: MRI ABDOMEN WITHOUT AND WITH CONTRAST (INCLUDING MRCP) TECHNIQUE: Multiplanar multisequence MR imaging of the abdomen was performed both before and after the administration of intravenous contrast. Heavily T2-weighted images of the biliary and pancreatic ducts were obtained, and three-dimensional MRCP images were rendered  by post processing. CONTRAST:  42mL GADAVIST GADOBUTROL 1 MMOL/ML IV SOLN COMPARISON:  CT abdomen/pelvis dated 02/18/2021 FINDINGS: Lower chest: Scarring/atelectasis at the left lung base. Cardiomegaly. Hepatobiliary: Liver is within normal limits. No hepatic steatosis. No suspicious/enhancing hepatic lesions. Status post cholecystectomy. No intrahepatic ductal dilatation. Dilated common duct, measuring 10 mm (series 11/image 20), with at least four mid/distal CBD stones (series 11/images 18-19) measuring up to 9 mm (series 7/image 16). Two additional cystic duct remnant stones measuring up to 7 mm (series 7/image 23). Pancreas: Multiple nonenhancing pancreatic cystic lesions, including a dominant 17 mm cyst in the pancreatic body (series 7/image 22). No enhancement following contrast administration. No main pancreatic ductal dilatation. Spleen:  Within normal limits. Adrenals/Urinary Tract:  Adrenal glands are within normal limits. 5 mm hemorrhagic cyst in the left upper kidney (series 1300/image 32). 13 mm right lower pole renal cyst (series 7/image 27). No enhancing renal lesions. No hydronephrosis. Stomach/Bowel: Stomach is within normal limits. Visualized bowel is notable for left colonic diverticulosis, without evidence of diverticulitis. Vascular/Lymphatic:  No evidence of abdominal aortic aneurysm. No suspicious abdominal lymphadenopathy. Other:  No abdominal ascites. Musculoskeletal: No focal osseous lesions. IMPRESSION: Status post cholecystectomy. Mildly dilated common duct, measuring 10 mm. Choledocholithiasis with multiple mid/distal CBD stones measuring up to 9 mm and two cystic duct remnant stones measuring up to 7 mm, as above. Multiple nonenhancing pancreatic cysts, measuring up to 17 mm in the pancreatic body. These likely reflect benign pseudocysts or side branch IPMNs. Given the patient's age, routine follow-up is not recommended. Electronically Signed   By: Julian Hy M.D.   On: 02/19/2021  10:41     Medications   Scheduled Meds:  doxazosin  2 mg Oral Daily   fluticasone  2 spray Each Nare BID   gabapentin  200 mg Oral QHS   levothyroxine  50 mcg Oral QAC breakfast   pantoprazole  40 mg Oral Daily   Continuous Infusions:     LOS: 0 days    Time spent: 30 minutes    Ezekiel Slocumb, DO [facility]  02/20/2021, 6:15 PM      If 7PM-7AM, please contact night-coverage. How to contact the [facility] Attending or Consulting provider [place] or covering provider during after hours [place], for this patient?    Check the care team in [facility] and look for a) attending/consulting [facility] provider listed and b) the [facility] team listed Log into [email or web address] and use [place]'s universal password to access. If you do not have the password, please  contact the hospital operator. Locate the [facility] provider you are looking for under [facility] and page to a number that you can be directly reached. If you still have difficulty reaching the provider, please page the [facility] (Director on Call) for the Hospitalists listed on amion for assistance.

## 2021-02-20 NOTE — H&P (View-Only) (Signed)
Gastroenterology Inpatient Follow-up Note   PATIENT IDENTIFICATION  Gary [name] is a 87 y.o. male  Hospital Day: 3  SUBJECTIVE  The patient and daughter are seen at the bedside today. He has come off COVID-19 infection protocol isolation. Patient states he has no significant abdominal pain at this time. His urine remains dark. No fevers or chills are noted.   OBJECTIVE  Scheduled Inpatient Medications:   doxazosin  2 mg Oral Daily   fluticasone  2 spray Each Nare BID   gabapentin  200 mg Oral QHS   levothyroxine  50 mcg Oral QAC breakfast   pantoprazole  40 mg Oral Daily   Continuous Inpatient Infusions:  PRN Inpatient Medications: acetaminophen **OR** acetaminophen, melatonin, polyethylene glycol   Physical Examination  Temp:  [98.2 F (36.8 C)-98.4 F (36.9 C)] 98.4 F (36.9 C) (06/18 0425) Pulse Rate:  [56-58] 58 (06/18 0425) Resp:  [18] 18 (06/18 0425) BP: (117-122)/(61-64) 117/64 (06/18 0425) SpO2:  [98 %] 98 % (06/18 0425) Temp (24hrs), Avg:98.3 F (36.8 C), Min:98.2 F (36.8 C), Max:98.4 F (36.9 C)  Weight: 82.8 kg GEN: NAD, appears younger than stated age, doesn't appear chronically ill PSYCH: Cooperative, without pressured speech EYE: Sclerae icteric ENT: MMM CV: Nontachycardic RESP: Decreased breath sounds at the bases bilaterally GI: NABS, soft, NT/ND, without rebound MSK/EXT: Lower extremity edema present SKIN: No jaundice NEURO:  Alert & Oriented x 3, no focal deficits   Review of Data   Laboratory Studies   Recent Labs  Lab 02/19/21 0245 02/20/21 0745  NA 139 140  K 3.3* 3.4*  CL 102 108  CO2 26 23  BUN 46* 38*  CREATININE 2.03* 1.78*  GLUCOSE 97 93  CALCIUM 8.2* 8.1*  MG 1.8  --    Recent Labs  Lab 02/20/21 0745  AST 51*  ALT 59*  ALKPHOS 213*    Recent Labs  Lab 02/18/21 2118 02/19/21 0245 02/20/21 0745  WBC 5.6 5.2 6.0  HGB 11.8* 11.5* 12.1*  HCT 37.0* 35.2* 37.2*  PLT 145* 146* 151   Recent Labs  Lab  02/19/21 0245  APTT 34  INR 1.4*    Imaging Studies  CT ABDOMEN PELVIS WO CONTRAST  Result Date: 02/19/2021 CLINICAL DATA:  Abdomen distension abnormal labs elevated bilirubin EXAM: CT ABDOMEN AND PELVIS WITHOUT CONTRAST TECHNIQUE: Multidetector CT imaging of the abdomen and pelvis was performed following the standard protocol without IV contrast. COMPARISON:  None. FINDINGS: Lower chest: Lung bases demonstrate trace left pleural effusion. Cardiomegaly with trace pericardial effusion. Mitral calcification. Coronary vascular calcification. Hepatobiliary: No focal hepatic abnormality. Status post cholecystectomy. Extrahepatic common bile duct up to 8 mm. Pancreas: Possible mild peripancreatic fat stranding. No ductal dilatation. Suspected 18 mm cyst at the pancreatic neck, series 3, image 30. Spleen: Normal in size without focal abnormality. Adrenals/Urinary Tract: Adrenal glands are normal. Kidneys slightly atrophic. No hydronephrosis. Punctate stones lower pole left kidney. Distended urinary bladder. Stomach/Bowel: Stomach nonenlarged. No dilated small bowel. Diverticular disease of the colon without acute wall thickening. Vascular/Lymphatic: Advanced aortic atherosclerosis. No aneurysm. No suspicious nodes. Reproductive: Enlarged prostate with calcification Other: Mild presacral edema and stranding. No significant pelvic effusion or ascites. Right lateral abdominal wall laxity with bulging of fat and bowel. Musculoskeletal: Degenerative changes. No acute or suspicious osseous abnormality IMPRESSION: 1. Status post cholecystectomy. No intra hepatic biliary dilatation. Slightly enlarged extrahepatic common bile duct, probably due to postsurgical change. Follow-up MRCP if ductal obstruction remains a concern. 2. Slightly indistinct appearance  of pancreas, question mild pancreatitis, correlate with appropriate enzymes. Suspected 18 mm cyst at the pancreatic neck 3. Nonobstructing stones in the left kidney 4.  Diverticular disease of the colon without acute inflammation Electronically Signed   By: Donavan Foil M.D.   On: 02/19/2021 00:05   DG Chest 1 View  Result Date: 02/19/2021 CLINICAL DATA:  Pneumonia EXAM: CHEST  1 VIEW COMPARISON:  CT abdomen and pelvis 02/18/2021, chest radiograph 10/23/2016 FINDINGS: Some residual patchy opacities are present in the right mid to lower lung. No visible layering effusion or pneumothorax. Pulmonary vascularity is normally distributed. Cardiomegaly is similar to prior with a calcified, tortuous aorta. Slight rightward mediastinal shift with tracheal tortuosity is also stable. Degenerative changes are present in the imaged spine and shoulders. Telemetry leads overlie the chest. IMPRESSION: 1. Some residual ill-defined opacities present in the right mid to lower lung may be atelectatic or related to infectious or inflammatory consolidation. 2. Enlarged cardiac silhouette may reflect a combination of cardiomegaly and pericardial effusion seen on comparison CT imaging. 3. Aortic Atherosclerosis (ICD10-I70.0). Electronically Signed   By: Lovena Le M.D.   On: 02/19/2021 02:36   MR ABDOMEN MRCP W WO CONTAST  Result Date: 02/19/2021 CLINICAL DATA:  Dilated LFTs EXAM: MRI ABDOMEN WITHOUT AND WITH CONTRAST (INCLUDING MRCP) TECHNIQUE: Multiplanar multisequence MR imaging of the abdomen was performed both before and after the administration of intravenous contrast. Heavily T2-weighted images of the biliary and pancreatic ducts were obtained, and three-dimensional MRCP images were rendered by post processing. CONTRAST:  23mL GADAVIST GADOBUTROL 1 MMOL/ML IV SOLN COMPARISON:  CT abdomen/pelvis dated 02/18/2021 FINDINGS: Lower chest: Scarring/atelectasis at the left lung base. Cardiomegaly. Hepatobiliary: Liver is within normal limits. No hepatic steatosis. No suspicious/enhancing hepatic lesions. Status post cholecystectomy. No intrahepatic ductal dilatation. Dilated common duct,  measuring 10 mm (series 11/image 20), with at least four mid/distal CBD stones (series 11/images 18-19) measuring up to 9 mm (series 7/image 16). Two additional cystic duct remnant stones measuring up to 7 mm (series 7/image 23). Pancreas: Multiple nonenhancing pancreatic cystic lesions, including a dominant 17 mm cyst in the pancreatic body (series 7/image 22). No enhancement following contrast administration. No main pancreatic ductal dilatation. Spleen:  Within normal limits. Adrenals/Urinary Tract:  Adrenal glands are within normal limits. 5 mm hemorrhagic cyst in the left upper kidney (series 1300/image 32). 13 mm right lower pole renal cyst (series 7/image 27). No enhancing renal lesions. No hydronephrosis. Stomach/Bowel: Stomach is within normal limits. Visualized bowel is notable for left colonic diverticulosis, without evidence of diverticulitis. Vascular/Lymphatic:  No evidence of abdominal aortic aneurysm. No suspicious abdominal lymphadenopathy. Other:  No abdominal ascites. Musculoskeletal: No focal osseous lesions. IMPRESSION: Status post cholecystectomy. Mildly dilated common duct, measuring 10 mm. Choledocholithiasis with multiple mid/distal CBD stones measuring up to 9 mm and two cystic duct remnant stones measuring up to 7 mm, as above. Multiple nonenhancing pancreatic cysts, measuring up to 17 mm in the pancreatic body. These likely reflect benign pseudocysts or side branch IPMNs. Given the patient's age, routine follow-up is not recommended. Electronically Signed   By: Julian Hy M.D.   On: 02/19/2021 10:41    GI Procedures and Studies  No relevant studies to review   ASSESSMENT  Gary [name] is a 87 y.o. male with a pmh significant for atrial fibrillation, chronic renal insufficiency, BPH, arthritis, GERD, status postcholecystectomy, choledocholithiasis found now.  Patient is hemodynamically stable.  Clinically he also is doing well.  LFT pattern remains elevated.  Patient and  daughter were not aware that an ERCP attempt would require general anesthesia and this concerns them.  Extensive discussion with the patient and daughter today to discuss ERCP and its role and indication.  I believe the patient is symptomatic even though he is not feeling unwell he has evidence of obstruction and jaundice and thus is at risk of cholangitis or pancreatitis in the future.  If the patient and family do not want to have general anesthesia, then would recommend interventional radiology consultation to discuss potential PBD placement that would not require general anesthesia but may not be the most optimal means of treatment either.  We will respect their wishes as many risks her with ERCP and as patients become older those risks increase.  We will still work on obtaining an ERCP slot for tomorrow while he washes out from his blood thinner.  The patient and daughter will decide by this afternoon how they would like to proceed.  The risks of an ERCP were discussed at length, including but not limited to the risk of perforation, bleeding, abdominal pain, post-ERCP pancreatitis (while usually mild can be severe and even life threatening). All patient questions were answered to the best of my ability, and the patient agrees to the aforementioned plan of action with follow-up as indicated.   PLAN/RECOMMENDATIONS  N.p.o. at midnight Tentative ERCP on 6/19 based on anesthesia availability Continue to hold blood thinner If VTE prophylaxis is required okay until tomorrow morning Patient and daughter will make final decision as to whether to undergo ERCP tomorrow Medical optimization as per medical service should patient undergo ERCP If patient and daughter decline ERCP attempt, then recommend interventional radiology consult to discuss potential PBD that may not require general anesthesia (though would have to be confirmed)   Please page/call with questions or concerns.   Justice Britain,  MD [facility] Office # [phone]    LOS: 0 days  Irving Copas  02/20/2021, 12:00 PM

## 2021-02-20 NOTE — Plan of Care (Signed)
  Problem: Clinical Measurements: Goal: Respiratory complications will improve Outcome: Progressing   Problem: Clinical Measurements: Goal: Cardiovascular complication will be avoided Outcome: Progressing   Problem: Pain Managment: Goal: General experience of comfort will improve Outcome: Progressing   Problem: Skin Integrity: Goal: Risk for impaired skin integrity will decrease Outcome: Progressing   

## 2021-02-21 ENCOUNTER — Encounter (HOSPITAL_COMMUNITY): Payer: Self-pay | Admitting: Internal Medicine

## 2021-02-21 ENCOUNTER — Inpatient Hospital Stay (HOSPITAL_COMMUNITY): Payer: Medicare Other | Admitting: Anesthesiology

## 2021-02-21 ENCOUNTER — Inpatient Hospital Stay (HOSPITAL_COMMUNITY): Payer: Medicare Other

## 2021-02-21 ENCOUNTER — Encounter (HOSPITAL_COMMUNITY)
Admission: EM | Disposition: A | Discharge status: Skilled Nursing Facility | Payer: Self-pay | Source: Skilled Nursing Facility | Attending: Internal Medicine

## 2021-02-21 HISTORY — PX: REMOVAL OF STONES: SHX5545

## 2021-02-21 HISTORY — PX: ENDOSCOPIC RETROGRADE CHOLANGIOPANCREATOGRAPHY (ERCP) WITH PROPOFOL: SHX5810

## 2021-02-21 HISTORY — PX: SPHINCTEROTOMY: SHX5544

## 2021-02-21 LAB — COMPREHENSIVE METABOLIC PANEL
ALT: 56 U/L — ABNORMAL HIGH (ref 0–44)
AST: 39 U/L (ref 15–41)
Albumin: 2.3 g/dL — ABNORMAL LOW (ref 3.5–5.0)
Alkaline Phosphatase: 208 U/L — ABNORMAL HIGH (ref 38–126)
Anion gap: 7 (ref 5–15)
BUN: 36 mg/dL — ABNORMAL HIGH (ref 8–23)
CO2: 26 mmol/L (ref 22–32)
Calcium: 8 mg/dL — ABNORMAL LOW (ref 8.9–10.3)
Chloride: 106 mmol/L (ref 98–111)
Creatinine, Ser: 1.75 mg/dL — ABNORMAL HIGH (ref 0.61–1.24)
GFR, Estimated: 34 mL/min — ABNORMAL LOW (ref >60)
Glucose, Bld: 117 mg/dL — ABNORMAL HIGH (ref 70–99)
Potassium: 3.6 mmol/L (ref 3.5–5.1)
Sodium: 139 mmol/L (ref 135–145)
Total Bilirubin: 4.4 mg/dL — ABNORMAL HIGH (ref 0.3–1.2)
Total Protein: 5.6 g/dL — ABNORMAL LOW (ref 6.5–8.1)

## 2021-02-21 SURGERY — ENDOSCOPIC RETROGRADE CHOLANGIOPANCREATOGRAPHY (ERCP) WITH PROPOFOL
Anesthesia: General

## 2021-02-21 MED ORDER — GLUCAGON HCL RDNA (DIAGNOSTIC) 1 MG IJ SOLR
INTRAMUSCULAR | Status: DC | PRN
  Administered 2021-02-21 (×3): .25 mg via INTRAVENOUS

## 2021-02-21 MED ORDER — INDOMETHACIN 50 MG RE SUPP
RECTAL | Status: DC | PRN
  Administered 2021-02-21: 100 mg via RECTAL

## 2021-02-21 MED ORDER — FENTANYL CITRATE (PF) 100 MCG/2ML IJ SOLN: 25.0000 ug | INTRAMUSCULAR | Status: DC | PRN

## 2021-02-21 MED ORDER — PHENYLEPHRINE HCL-NACL 10-0.9 MG/250ML-% IV SOLN
INTRAVENOUS | Status: DC | PRN
  Administered 2021-02-21: 25 ug/min via INTRAVENOUS

## 2021-02-21 MED ORDER — CIPROFLOXACIN IN D5W 400 MG/200ML IV SOLN
INTRAVENOUS | Status: AC
  Filled 2021-02-21: qty 200

## 2021-02-21 MED ORDER — GLUCAGON HCL RDNA (DIAGNOSTIC) 1 MG IJ SOLR
INTRAMUSCULAR | Status: AC
  Filled 2021-02-21: qty 1

## 2021-02-21 MED ORDER — CIPROFLOXACIN IN D5W 400 MG/200ML IV SOLN
INTRAVENOUS | Status: DC | PRN
  Administered 2021-02-21: 400 mg via INTRAVENOUS

## 2021-02-21 MED ORDER — ROCURONIUM BROMIDE 10 MG/ML (PF) SYRINGE
PREFILLED_SYRINGE | INTRAVENOUS | Status: DC | PRN
  Administered 2021-02-21: 50 mg via INTRAVENOUS

## 2021-02-21 MED ORDER — DEXAMETHASONE SODIUM PHOSPHATE 10 MG/ML IJ SOLN
INTRAMUSCULAR | Status: DC | PRN
  Administered 2021-02-21: 4 mg via INTRAVENOUS

## 2021-02-21 MED ORDER — LIDOCAINE 2% (20 MG/ML) 5 ML SYRINGE
INTRAMUSCULAR | Status: DC | PRN
  Administered 2021-02-21: 40 mg via INTRAVENOUS

## 2021-02-21 MED ORDER — LACTATED RINGERS IV SOLN: INTRAVENOUS | Status: DC | PRN

## 2021-02-21 MED ORDER — SUGAMMADEX SODIUM 200 MG/2ML IV SOLN
INTRAVENOUS | Status: DC | PRN
  Administered 2021-02-21: 170 mg via INTRAVENOUS

## 2021-02-21 MED ORDER — EPHEDRINE SULFATE-NACL 50-0.9 MG/10ML-% IV SOSY
PREFILLED_SYRINGE | INTRAVENOUS | Status: DC | PRN
  Administered 2021-02-21: 10 mg via INTRAVENOUS
  Administered 2021-02-21 (×2): 5 mg via INTRAVENOUS
  Administered 2021-02-21: 10 mg via INTRAVENOUS

## 2021-02-21 MED ORDER — PROPOFOL 10 MG/ML IV BOLUS
INTRAVENOUS | Status: DC | PRN
  Administered 2021-02-21: 50 mg via INTRAVENOUS

## 2021-02-21 MED ORDER — INDOMETHACIN 50 MG RE SUPP
RECTAL | Status: AC
  Filled 2021-02-21: qty 2

## 2021-02-21 MED ORDER — ONDANSETRON HCL 4 MG/2ML IJ SOLN
INTRAMUSCULAR | Status: DC | PRN
  Administered 2021-02-21: 4 mg via INTRAVENOUS

## 2021-02-21 MED ORDER — SODIUM CHLORIDE 0.9 % IV SOLN
INTRAVENOUS | Status: DC | PRN
  Administered 2021-02-21: 35 mL

## 2021-02-21 NOTE — Anesthesia Procedure Notes (Signed)
Procedure Name: Intubation Date/Time: 02/21/2021 8:00 AM Performed by: Renato Shin, CRNA Pre-anesthesia Checklist: Patient identified, Emergency Drugs available, Suction available and Patient being monitored Patient Re-evaluated:Patient Re-evaluated prior to induction Oxygen Delivery Method: Circle system utilized Preoxygenation: Pre-oxygenation with 100% oxygen Induction Type: IV induction Ventilation: Mask ventilation without difficulty and Oral airway inserted - appropriate to patient size Laryngoscope Size: Glidescope and 4 Grade View: Grade I Tube type: Oral Tube size: 7.5 mm Number of attempts: 1 Airway Equipment and Method: Oral airway, Rigid stylet and Video-laryngoscopy Placement Confirmation: ETT inserted through vocal cords under direct vision, positive ETCO2 and breath sounds checked- equal and bilateral Secured at: 22 cm Tube secured with: Tape Dental Injury: Teeth and Oropharynx as per pre-operative assessment  Difficulty Due To: Difficulty was anticipated, Difficult Airway- due to anterior larynx and Difficult Airway- due to reduced neck mobility

## 2021-02-21 NOTE — Plan of Care (Signed)
  Problem: Nutrition: Goal: Adequate nutrition will be maintained Outcome: Progressing   Problem: Pain Managment: Goal: General experience of comfort will improve Outcome: Progressing   

## 2021-02-21 NOTE — Anesthesia Preprocedure Evaluation (Addendum)
Anesthesia Evaluation  Patient identified by MRN, date of birth, ID band Patient awake    Reviewed: Allergy & Precautions, NPO status , Patient's Chart, lab work & pertinent test results, reviewed documented beta blocker date and time   History of Anesthesia Complications Negative for: history of anesthetic complications  Airway Mallampati: III  TM Distance: >3 FB Neck ROM: Limited    Dental  (+) Dental Advisory Given   Pulmonary neg pulmonary ROS,    Pulmonary exam normal        Cardiovascular hypertension, Pt. on home beta blockers and Pt. on medications + dysrhythmias Atrial Fibrillation  Rhythm:Irregular Rate:Bradycardia   '19 TTE - Normal EF. AV mobility was moderately restricted (limited study unable to assess/quantify AS). Trivial MR. LA and RA were severely dilated. Mild TR. PASP was mildly increased. PA peak pressure: 39 mm Hg (S). A trivial pericardial effusion was identified posterior to the heart.     Neuro/Psych  Headaches,  Hemiplegic Insomnia  negative psych ROS   GI/Hepatic Neg liver ROS, GERD  Medicated and Controlled,  Endo/Other  Hypothyroidism   Renal/GU CRFRenal disease     Musculoskeletal  (+) Arthritis ,   Abdominal   Peds  Hematology  (+) anemia ,  On eliquis    Anesthesia Other Findings Covid+ last month (still testing positive), currently asymptomatic, not on precautions per ID    Reproductive/Obstetrics                            Anesthesia Physical Anesthesia Plan  ASA: 3  Anesthesia Plan: General   Post-op Pain Management:    Induction: Intravenous  PONV Risk Score and Plan: 2 and Treatment may vary due to age or medical condition, Ondansetron and Propofol infusion  Airway Management Planned: Oral ETT  Additional Equipment: None  Intra-op Plan:   Post-operative Plan: Extubation in OR  Informed Consent: I have reviewed the patients History  and Physical, chart, labs and discussed the procedure including the risks, benefits and alternatives for the proposed anesthesia with the patient or authorized representative who has indicated his/her understanding and acceptance.   Patient has DNR.  Discussed DNR with patient and Suspend DNR.   Dental advisory given  Plan Discussed with: CRNA and Anesthesiologist  Anesthesia Plan Comments:        Anesthesia Quick Evaluation

## 2021-02-21 NOTE — Interval H&P Note (Signed)
History and Physical Interval Note:  02/21/2021 7:48 AM  Gary [name]  has presented today for surgery, with the diagnosis of Choledocholithiasis.  The various methods of treatment have been discussed with the patient and family. After consideration of risks, benefits and other options for treatment, the patient has consented to  Procedure(s): ENDOSCOPIC RETROGRADE CHOLANGIOPANCREATOGRAPHY (ERCP) WITH PROPOFOL (N/A) as a surgical intervention.  The patient's history has been reviewed, patient examined, no change in status, stable for surgery.  I have reviewed the patient's chart and labs.  Questions were answered to the patient's satisfaction.     [employer]

## 2021-02-21 NOTE — Anesthesia Postprocedure Evaluation (Signed)
Anesthesia Post Note  Patient: SUN WILENSKY  Procedure(s) Performed: ENDOSCOPIC RETROGRADE CHOLANGIOPANCREATOGRAPHY (ERCP) WITH PROPOFOL SPHINCTEROTOMY REMOVAL OF STONES     Patient location during evaluation: PACU Anesthesia Type: General Level of consciousness: awake and alert Pain management: pain level controlled Vital Signs Assessment: post-procedure vital signs reviewed and stable Respiratory status: spontaneous breathing, nonlabored ventilation, respiratory function stable and patient connected to nasal cannula oxygen Cardiovascular status: blood pressure returned to baseline, stable and bradycardic Postop Assessment: no apparent nausea or vomiting Anesthetic complications: no   No notable events documented.  Last Vitals:  Vitals:   02/21/21 0912 02/21/21 0926  BP: (!) 116/53 (!) 105/51  Pulse: (!) 51 (!) 55  Resp: (!) 23 (!) 21  Temp: 36.8 C   SpO2: 98% 97%    Last Pain:  Vitals:   02/21/21 0926  TempSrc:   PainSc: 0-No pain                 Audry Pili

## 2021-02-21 NOTE — Progress Notes (Signed)
PROGRESS NOTE    LEARY MCNULTY   YYT:[id number]  DOB: October 08, 1915  PCP: Virgie Dad, MD    DOA: 02/18/2021 LOS: 1   Assessment & Plan   Principal Problem:   Jaundice Active Problems:   Atrial fibrillation, chronic (HCC)   Essential hypertension, benign   Chronic kidney disease, stage 3b (HCC)   Goals of care, counseling/discussion   BPH (benign prostatic hyperplasia)   Hypothyroidism   GERD without esophagitis   Prolonged Q-T interval on ECG   Pressure injury of skin of coccygeal region   Choledocholithiasis   Choledocholithiasis -Dr. Minerva Areola consulted.   Tentative plan for ERCP tomorrow, but patient and daughter are discussing this together given the increased risk is with his advanced age. LFTs remain elevated, T bili slightly improved. 6/19: ERCP this AM, went well. --See GI recommendations --Follow CMP --Hold anticoagulation, resume when ok w/GI   Chronic Atrial Fibrillation - Continue Eliquis.  Rate controlled.  Tele.  CKD stage 3b - renal function near baseline, mild elevation not meeting AKI criteria on admission. --gentle fluids, poor PO intake --strict I/O's --follow BMP  BPH - continue Cardura  Hypothyroidism - continue Synthroid  Prolonged Qtc - on admission EKG.  Tele.  Goal K>4, Mg>2.  GERD - continue PPI  Goals of care - reviewed discussion that were had on admission and discussed in depth with patient and family. He decided to have ERCP but confirms otherwise remain DNR code status.    Patient BMI: Body mass index is 26.96 kg/m.   DVT prophylaxis:    Diet:  Diet Orders (From admission, onward)     Start     Ordered   02/21/21 1047  Diet clear liquid Room service appropriate? Yes; Fluid consistency: Thin  Diet effective now       Question Answer Comment  Room service appropriate? Yes   Fluid consistency: Thin      02/21/21 1046              Code Status: DNR   Brief Narrative / Hospital Course to Date:   87 y.o.  male admitted on 02/19/21 after presenting from SNF with jaundice and abnormal LFT's.  He reported several week history of general malaise and weakness, poor appetite which attributed to having Covid-19 over a month ago.    Found to have choledocholithiasis.  GI consulted and plan for ERCP on Sunday, if patient decides to proceed.  He and daughter are discussing after speaking with GI today.  Subjective 02/21/21    Patient seen with daughter and son-in-law at bedside this morning after undergoing ERCP.  He is still sleepy but woke for several minutes, sense of humor is intact.  Reviewed ERCP report with family.  No acute events or complications reported.   Disposition Plan & Communication   Status is: Inpatient  Remains inpatient appropriate because:Ongoing diagnostic testing needed not appropriate for outpatient work up  Dispo: The patient is from: SNF              Anticipated d/c is to: SNF              Patient currently is not medically stable to d/c.   Difficult to place patient No   Family Communication: Daughter at bedside on rounds today 6/18   Consults, Procedures, Significant Events   Consultants:  Gastroenterology, Dr. Rush Landmark  Procedures:  None, ERCP planned  Antimicrobials:  Anti-infectives (From admission, onward)    None  Micro    Objective   Vitals:   02/21/21 0912 02/21/21 0926 02/21/21 0941 02/21/21 1018  BP: (!) 116/53 (!) 105/51 107/72 (!) 110/52  Pulse: (!) 51 (!) 55 (!) 53 (!) 52  Resp: (!) 23 (!) 21 18   Temp: 98.2 F (36.8 C)  98.3 F (36.8 C) 98.3 F (36.8 C)  TempSrc:      SpO2: 98% 97% 93% 90%  Weight:      Height:        Intake/Output Summary (Last 24 hours) at 02/21/2021 1420 Last data filed at 02/21/2021 0859 Gross per 24 hour  Intake 440 ml  Output 1200 ml  Net -760 ml   Filed Weights   02/18/21 2108 02/21/21 0737  Weight: 82.8 kg 82.8 kg    Physical Exam:  General exam: sleeping but woke up for a few  minutes, no acute distress HEENT: Scleral icterus, hard of hearing Respiratory system: CTAB, normal respiratory effort, nasal cannula in place. Cardiovascular system: RRR, no extremity edema.   Central nervous system: normal speech, no gross focal neurologic deficits Extremities: moves all, no edema, normal tone   Labs   Data Reviewed: I have personally reviewed following labs and imaging studies  CBC: Recent Labs  Lab 02/18/21 2118 02/19/21 0245 02/20/21 0745  WBC 5.6 5.2 6.0  NEUTROABS  --  4.0  --   HGB 11.8* 11.5* 12.1*  HCT 37.0* 35.2* 37.2*  MCV 98.7 97.2 96.6  PLT 145* 146* 106   Basic Metabolic Panel: Recent Labs  Lab 02/18/21 2118 02/19/21 0245 02/20/21 0745 02/21/21 0132  NA 142 139 140 139  K 3.8 3.3* 3.4* 3.6  CL 104 102 108 106  CO2 27 26 23 26   GLUCOSE 108* 97 93 117*  BUN 51* 46* 38* 36*  CREATININE 2.20* 2.03* 1.78* 1.75*  CALCIUM 8.1* 8.2* 8.1* 8.0*  MG 2.1 1.8  --   --    GFR: Estimated Creatinine Clearance: 21.3 mL/min (A) (by C-G formula based on SCr of 1.75 mg/dL (H)). Liver Function Tests: Recent Labs  Lab 02/18/21 2118 02/19/21 0245 02/20/21 0745 02/21/21 0132  AST 79* 73* 51* 39  ALT 81* 78* 59* 56*  ALKPHOS 220* 209* 213* 208*  BILITOT 6.2* 5.5* 5.5* 4.4*  PROT 5.7* 5.7* 5.4* 5.6*  ALBUMIN 2.4* 2.3* 2.2* 2.3*   Recent Labs  Lab 02/18/21 2118  LIPASE 56*   Recent Labs  Lab 02/19/21 0139  AMMONIA 23   Coagulation Profile: Recent Labs  Lab 02/19/21 0245  INR 1.4*   Cardiac Enzymes: No results for input(s): CKTOTAL, CKMB, CKMBINDEX, TROPONINI in the last 168 hours. BNP (last 3 results) No results for input(s): PROBNP in the last 8760 hours. HbA1C: No results for input(s): HGBA1C in the last 72 hours. CBG: No results for input(s): GLUCAP in the last 168 hours. Lipid Profile: No results for input(s): CHOL, HDL, LDLCALC, TRIG, CHOLHDL, LDLDIRECT in the last 72 hours. Thyroid Function Tests: No results for input(s):  TSH, T4TOTAL, FREET4, T3FREE, THYROIDAB in the last 72 hours. Anemia Panel: No results for input(s): VITAMINB12, FOLATE, FERRITIN, TIBC, IRON, RETICCTPCT in the last 72 hours. Sepsis Labs: Recent Labs  Lab 02/18/21 2119  LATICACIDVEN 1.2    Recent Results (from the past 240 hour(s))  SARS CORONAVIRUS 2 (TAT 6-24 HRS) Nasopharyngeal Nasopharyngeal Swab     Status: Abnormal   Collection Time: 02/19/21  1:03 AM   Specimen: Nasopharyngeal Swab  Result Value Ref Range Status   SARS  Coronavirus 2 POSITIVE (A) NEGATIVE Final    Comment: (NOTE) SARS-CoV-2 target nucleic acids are DETECTED.  The SARS-CoV-2 RNA is generally detectable in upper and lower respiratory specimens during the acute phase of infection. Positive results are indicative of the presence of SARS-CoV-2 RNA. Clinical correlation with patient history and other diagnostic information is  necessary to determine patient infection status. Positive results do not rule out bacterial infection or co-infection with other viruses.  The expected result is Negative.  Fact Sheet for Patients: SugarRoll.be  Fact Sheet for Healthcare Providers: https://www.woods-mathews.com/  This test is not yet approved or cleared by the [place] FDA and  has been authorized for detection and/or diagnosis of SARS-CoV-2 by FDA under an Emergency Use Authorization (EUA). This EUA will remain  in effect (meaning this test can be used) for the duration of the COVID-19 declaration under Section 564(b)(1) of the Act, 21 U. S.C. section 360bbb-3(b)(1), unless the authorization is terminated or revoked sooner.   Performed at [facility], [place] [address], [place], [place] [address]   MRSA PCR Screening     Status: None   Collection Time: 02/20/21  9:40 PM  Result Value Ref Range Status   MRSA by PCR NEGATIVE NEGATIVE Final    Comment:        The GeneXpert MRSA Assay (FDA approved for NASAL  specimens only), is one component of a comprehensive MRSA colonization surveillance program. It is not intended to diagnose MRSA infection nor to guide or monitor treatment for MRSA infections. Performed at [facility], [place] [address], [place],  [address]       Imaging Studies   DG ERCP  Result Date: 02/21/2021 CLINICAL DATA:  Choledocholithiasis post cholecystectomy EXAM: ERCP TECHNIQUE: Multiple spot images obtained with the fluoroscopic device and submitted for interpretation post-procedure. COMPARISON:  MRCP 02/19/2021 FINDINGS: A series of fluoroscopic spot images document endoscopic cannulation and partial opacification of the CBD with withdrawal of balloon catheter through the length of the CBD. The intrahepatic ducts are incompletely opacified, peer Ng decompressed centrally. Cholecystectomy clips are noted. No extravasation. IMPRESSION: Endoscopic CBD cannulation and intervention as above. These images were submitted for radiologic interpretation only. Please see the procedural report for the amount of contrast and the fluoroscopy time utilized. Electronically Signed   By: Lucrezia Europe M.D.   On: 02/21/2021 11:32     Medications   Scheduled Meds:  doxazosin  2 mg Oral Daily   fluticasone  2 spray Each Nare BID   gabapentin  200 mg Oral QHS   levothyroxine  50 mcg Oral QAC breakfast   pantoprazole  40 mg Oral Daily   Continuous Infusions:     LOS: 1 day    Time spent: 30 minutes with > 50% spent at bedside and in coordination of care.    Ezekiel Slocumb, DO [facility]  02/21/2021, 2:20 PM      If 7PM-7AM, please contact night-coverage. How to contact the [facility] Attending or Consulting provider [place] or covering provider during after hours [place], for this patient?    Check the care team in [facility] and look for a) attending/consulting TR[facility] provider listed and b) the [facility] team listed Log into [email or web address] and use 's universal password  to access. If you do not have the password, please contact the hospital operator. Locate the [facility] provider you are looking for under [facility] and page to a number that you can be directly  reached. If you still have difficulty reaching the provider, please page the [facility] (Director on Call) for the Hospitalists listed on amion for assistance.

## 2021-02-21 NOTE — Transfer of Care (Signed)
Immediate Anesthesia Transfer of Care Note  Patient: Gary [name]  Procedure(s) Performed: ENDOSCOPIC RETROGRADE CHOLANGIOPANCREATOGRAPHY (ERCP) WITH PROPOFOL SPHINCTEROTOMY REMOVAL OF STONES  Patient Location: PACU  Anesthesia Type:General  Level of Consciousness: awake and patient cooperative  Airway & Oxygen Therapy: Patient Spontanous Breathing and Patient connected to face mask oxygen  Post-op Assessment: Report given to RN and Post -op Vital signs reviewed and stable  Post vital signs: Reviewed and stable  Last Vitals:  Vitals Value Taken Time  BP 116/53 02/21/21 0912  Temp    Pulse 55 02/21/21 0913  Resp 17 02/21/21 0913  SpO2 99 % 02/21/21 0913  Vitals shown include unvalidated device data.  Last Pain:  Vitals:   02/21/21 0737  TempSrc: Oral  PainSc: 0-No pain         Complications: No notable events documented.

## 2021-02-21 NOTE — Op Note (Signed)
[facility] Patient Name: Gary [name] Procedure Date : 02/20/2021 MRN: [id number] Attending MD: Justice Britain , MD Date of Birth: 04-05-1916 CSN: [id number] Age: 87 Admit Type: Inpatient Procedure:                ERCP Indications:              Bile duct stone(s), Abnormal MRCP, Jaundice,                            Elevated liver enzymes Providers:                Justice Britain, MD, Grace Isaac, RN, Elmer Ramp.                            Tilden Dome, RN, [employer], Technician, [employer]                            CRNA Referring MD:             [facility] Medicines:                General Anesthesia, Cipro 400 mg IV, Indomethacin                            100 mg PR, Glucagon 3.52 mg IV Complications:            No immediate complications. Estimated Blood Loss:     Estimated blood loss was minimal. Procedure:                Pre-Anesthesia Assessment:                           - Prior to the procedure, a History and Physical                            was performed, and patient medications and                            allergies were reviewed. The patient's tolerance of                            previous anesthesia was also reviewed. The risks                            and benefits of the procedure and the sedation                            options and risks were discussed with the patient.                            All questions were answered, and informed consent                            was obtained. Prior Anticoagulants: The patient has  taken no previous anticoagulant or antiplatelet                            agents. ASA Grade Assessment: III - A patient with                            severe systemic disease. After reviewing the risks                            and benefits, the patient was deemed in                            satisfactory condition to undergo the procedure.                           After  obtaining informed consent, the scope was                            passed under direct vision. Throughout the                            procedure, the patient's blood pressure, pulse, and                            oxygen saturations were monitored continuously. The                            TJF-Q180V ([id number]) Olympus Duodenoscope was                            introduced through the mouth, and used to inject                            contrast into and used to inject contrast into the                            bile duct. The ERCP was accomplished without                            difficulty. The patient tolerated the procedure. Scope In: Scope Out: Findings:      A scout film of the abdomen was obtained. Surgical clips, consistent       with a previous cholecystectomy, were seen in the area of the right       upper quadrant of the abdomen.      The esophagus was successfully intubated under direct vision without       detailed examination of the pharynx, larynx, and associated structures,       and upper GI tract. The major papilla was normal.      A short 0.035 inch Soft Jagwire was passed into the biliary tree after       going into a long-position. The Hydratome sphincterotome was passed over       the guidewire and the bile duct was then deeply cannulated. Contrast was       injected.  I personally interpreted the bile duct images. Ductal flow of       contrast was adequate. Image quality was adequate. Contrast extended to       the hepatic ducts. Opacification of the entire biliary tree except for       the cystic duct and gallbladder was successful. The main bile duct was       moderately dilated. The largest diameter was 14 mm. The lower third of       the main bile duct and middle third of the main bile duct contained       filling defects thought to be stones and sludge. A 10 mm biliary       sphincterotomy was made with a monofilament Hydratome sphincterotome       using  ERBE electrocautery. There was no post-sphincterotomy bleeding. To       discover objects, the biliary tree was swept with a retrieval balloon       starting at the bifurcation. Sludge was swept from the duct. A few       stones were removed. No stones remained. An occlusion cholangiogram was       performed that showed no further significant biliary pathology though       the cystic duct did not fill (query a result of the cystic duct stones       that were felt to be present on MRICP - I could not get my wire into the       cystic duct however).      A pancreatogram was not performed.      The duodenoscope was withdrawn from the patient. Impression:               - The major papilla appeared normal.                           - The entire main bile duct was moderately dilated.                           - Filling defects consistent with stones and sludge                            was seen on the cholangiogram.                           - Choledocholithiasis was found. Complete removal                            was accomplished by biliary sphincterotomy and                            balloon sweep. Recommendation:           - The patient will be observed post-procedure,                            until all discharge criteria are met.                           - Return patient to hospital ward for ongoing care.                           -  Advance diet as tolerated.                           - Observe patient's clinical course.                           - No VTE PPx x 48 hours to decrease risk of                            post-interventional bleeding.                           - Watch for pancreatitis, bleeding, perforation,                            and cholangitis.                           - Check liver enzymes (AST, ALT, alkaline                            phosphatase, bilirubin) in the morning.                           - Start Protonix 40 mg daily to decrease risk of                             post-interventional bleeding from sphincterotomy                            for the next 32-month                           - It is possible that the cystic duct stones noted                            on MRICP could travel downwards into the biliary                            tree, but with the large sphincterotomy in place,                            they should pass. Time will tell.                           - Follow up LFTs in 2-weeks as outpatient.                           - We will arrange follow up in clinic if patient                            desires.                           - The findings and recommendations were discussed  with the patient.                           - The findings and recommendations were discussed                            with the patient's family.                           - The findings and recommendations were discussed                            with the referring physician. Procedure Code(s):        --- Professional ---                           [phone], Endoscopic retrograde                            cholangiopancreatography (ERCP); with removal of                            calculi/debris from biliary/pancreatic duct(s)                           43262, Endoscopic retrograde                            cholangiopancreatography (ERCP); with                            sphincterotomy/papillotomy Diagnosis Code(s):        --- Professional ---                           R93.2, Abnormal findings on diagnostic imaging of                            liver and biliary tract                           K80.50, Calculus of bile duct without cholangitis                            or cholecystitis without obstruction                           R17, Unspecified jaundice                           R74.8, Abnormal levels of other serum enzymes                           K83.8, Other specified diseases of biliary tract CPT copyright  2019 American Medical Association. All rights reserved. The codes documented in this report are preliminary and upon coder review may  be revised to meet current compliance requirements. Justice Britain, MD 02/21/2021 9:22:49 AM Number of Addenda: 0

## 2021-02-22 ENCOUNTER — Encounter (HOSPITAL_COMMUNITY): Payer: Self-pay | Admitting: Gastroenterology

## 2021-02-22 DIAGNOSIS — K8050 Calculus of bile duct without cholangitis or cholecystitis without obstruction: Secondary | ICD-10-CM | POA: Diagnosis not present

## 2021-02-22 LAB — COMPREHENSIVE METABOLIC PANEL
ALT: 46 U/L — ABNORMAL HIGH (ref 0–44)
AST: 25 U/L (ref 15–41)
Albumin: 2.3 g/dL — ABNORMAL LOW (ref 3.5–5.0)
Alkaline Phosphatase: 174 U/L — ABNORMAL HIGH (ref 38–126)
Anion gap: 5 (ref 5–15)
BUN: 33 mg/dL — ABNORMAL HIGH (ref 8–23)
CO2: 29 mmol/L (ref 22–32)
Calcium: 8.4 mg/dL — ABNORMAL LOW (ref 8.9–10.3)
Chloride: 105 mmol/L (ref 98–111)
Creatinine, Ser: 1.76 mg/dL — ABNORMAL HIGH (ref 0.61–1.24)
GFR, Estimated: 34 mL/min — ABNORMAL LOW (ref >60)
Glucose, Bld: 116 mg/dL — ABNORMAL HIGH (ref 70–99)
Potassium: 3.7 mmol/L (ref 3.5–5.1)
Sodium: 139 mmol/L (ref 135–145)
Total Bilirubin: 3.3 mg/dL — ABNORMAL HIGH (ref 0.3–1.2)
Total Protein: 5.7 g/dL — ABNORMAL LOW (ref 6.5–8.1)

## 2021-02-22 LAB — CBC
HCT: 37.2 % — ABNORMAL LOW (ref 39.0–52.0)
Hemoglobin: 11.4 g/dL — ABNORMAL LOW (ref 13.0–17.0)
MCH: 31.1 pg (ref 26.0–34.0)
MCHC: 30.6 g/dL (ref 30.0–36.0)
MCV: 101.6 fL — ABNORMAL HIGH (ref 80.0–100.0)
Platelets: 165 10*3/uL (ref 150–400)
RBC: 3.66 MIL/uL — ABNORMAL LOW (ref 4.22–5.81)
RDW: 14.2 % (ref 11.5–15.5)
WBC: 7.2 10*3/uL (ref 4.0–10.5)
nRBC: 0 % (ref 0.0–0.2)

## 2021-02-22 MED ORDER — APIXABAN 2.5 MG PO TABS
2.5000 mg | ORAL_TABLET | Freq: Two times a day (BID) | ORAL | Status: DC
  Administered 2021-02-22 – 2021-02-23 (×2): 2.5 mg via ORAL
  Filled 2021-02-22 (×2): qty 1

## 2021-02-22 NOTE — Plan of Care (Signed)
  Problem: Education: Goal: Knowledge of General Education information will improve Description: Including pain rating scale, medication(s)/side effects and non-pharmacologic comfort measures Outcome: Progressing   Problem: Nutrition: Goal: Adequate nutrition will be maintained Outcome: Progressing   Problem: Safety: Goal: Ability to remain free from injury will improve Outcome: Progressing   

## 2021-02-22 NOTE — TOC Progression Note (Addendum)
Transition of Care [facility]) - Progression Note    Patient Details  Name: Gary [name] MRN: [id number] Date of Birth: 09-08-1915  Transition of Care [facility]) CM/SW [place], [place] Phone Number: 02/22/2021, 10:32 AM  Clinical Narrative:    CSW spoke with Butch Penny from [employer].  Pt can return when medically stable for discharge.  Pt will need evaluation PT/OT.  TOC will continue to assist with disposition planning.        Expected Discharge Plan and Services                                                 Social Determinants of Health (SDOH) Interventions    Readmission Risk Interventions No flowsheet data found.

## 2021-02-22 NOTE — Progress Notes (Signed)
PROGRESS NOTE    Gary [name]   UUV:[id number]  DOB: 12-17-1915  PCP: Virgie Dad, MD    DOA: 02/18/2021 LOS: 2   Assessment & Plan   Principal Problem:   Jaundice Active Problems:   Atrial fibrillation, chronic (HCC)   Essential hypertension, benign   Chronic kidney disease, stage 3b (HCC)   Goals of care, counseling/discussion   BPH (benign prostatic hyperplasia)   Hypothyroidism   GERD without esophagitis   Prolonged Q-T interval on ECG   Pressure injury of skin of coccygeal region   Choledocholithiasis   Choledocholithiasis - GI, Dr. Rush Landmark consulted.   6/19: underwent ERCP  --See GI recommendations --Diet advanced per GI --Follow CMP --Repeat LFT's in 3-4 weeks --resume  anticoagulation   Chronic Atrial Fibrillation - Continue Eliquis.  Rate controlled.  Tele.  CKD stage 3b - renal function near baseline, mild elevation not meeting AKI criteria on admission. --gentle fluids, poor PO intake --strict I/O's --follow BMP  BPH - continue Cardura  Hypothyroidism - continue Synthroid  Prolonged Qtc - on admission EKG.  Tele.  Goal K>4, Mg>2.  GERD - continue PPI  Ambulatory dysfunction / Generalized weakness - mostly due to advanced age, co-morbidities.  From [facility] and now in skilled setting, transferred from ALF only a few months ago.  Goals of care - reviewed discussion that were had on admission and discussed in depth with patient and family. He decided to have ERCP but confirms otherwise remain DNR code status.    Patient BMI: Body mass index is 26.96 kg/m.   DVT prophylaxis:    Diet:  Diet Orders (From admission, onward)     Start     Ordered   02/22/21 1127  Diet regular Room service appropriate? Yes; Fluid consistency: Thin  Diet effective now       Question Answer Comment  Room service appropriate? Yes   Fluid consistency: Thin      02/22/21 1126              Code Status: DNR   Brief Narrative / Hospital Course  to Date:   87 y.o. male admitted on 02/19/21 after presenting from SNF with jaundice and abnormal LFT's.  He reported several week history of general malaise and weakness, poor appetite which attributed to having Covid-19 over a month ago.    Found to have choledocholithiasis.   GI consulted and took patient for ERCP on Sunday 02/21/21 with successful removal of CBD stone and sphincterotomy to hopefully prevent any future obstruction.  Patient doing very well post-procedure and labs improving.     Subjective 02/22/21    Patient seen with two daughters visiting this AM.  He is awake and cheerful.  Says he's ready to go.  Denies abdominal pain, nausea or vomiting.  No acute complaints.    Disposition Plan & Communication   Status is: Inpatient  Remains inpatient appropriate because:Ongoing diagnostic testing needed not appropriate for outpatient work up  Dispo: The patient is from: SNF              Anticipated d/c is to: SNF              Patient currently is not medically stable to d/c.   Difficult to place patient No   Family Communication: Both daughters at bedside on rounds today 6/20   Consults, Procedures, Significant Events   Consultants:  Gastroenterology, Dr. Rush Landmark  Procedures:  None, ERCP planned  Antimicrobials:  Anti-infectives (From admission,  onward)    None         Micro    Objective   Vitals:   02/21/21 1018 02/21/21 2205 02/22/21 0742 02/22/21 1529  BP: (!) 110/52 (!) 122/50 (!) 117/49 (!) 119/56  Pulse: (!) 52 (!) 51 (!) 57 89  Resp:   15 15  Temp: 98.3 F (36.8 C) 98 F (36.7 C) 98.5 F (36.9 C) (!) 97.5 F (36.4 C)  TempSrc:  Oral    SpO2: 90% 98% 100% 98%  Weight:      Height:        Intake/Output Summary (Last 24 hours) at 02/22/2021 1538 Last data filed at 02/22/2021 1500 Gross per 24 hour  Intake 240 ml  Output 600 ml  Net -360 ml   Filed Weights   02/18/21 2108 02/21/21 0737  Weight: 82.8 kg 82.8 kg    Physical  Exam:  General exam: awake and alert, in good spirits, talkative, no acute distress HEENT: Improving scleral icterus, hard of hearing Respiratory system: normal respiratory effort, nasal cannula in place. Cardiovascular system: RRR, no extremity edema.   Central nervous system: normal speech, no gross focal neurologic deficits Gastrointestinal: soft, non-tender, mild distention unchanged Skin: Jaundice improved, dry, intact, scattered SK's   Labs   Data Reviewed: I have personally reviewed following labs and imaging studies  CBC: Recent Labs  Lab 02/18/21 2118 02/19/21 0245 02/20/21 0745 02/22/21 1359  WBC 5.6 5.2 6.0 7.2  NEUTROABS  --  4.0  --   --   HGB 11.8* 11.5* 12.1* 11.4*  HCT 37.0* 35.2* 37.2* 37.2*  MCV 98.7 97.2 96.6 101.6*  PLT 145* 146* 151 193   Basic Metabolic Panel: Recent Labs  Lab 02/18/21 2118 02/19/21 0245 02/20/21 0745 02/21/21 0132 02/22/21 1359  NA 142 139 140 139 139  K 3.8 3.3* 3.4* 3.6 3.7  CL 104 102 108 106 105  CO2 27 26 23 26 29   GLUCOSE 108* 97 93 117* 116*  BUN 51* 46* 38* 36* 33*  CREATININE 2.20* 2.03* 1.78* 1.75* 1.76*  CALCIUM 8.1* 8.2* 8.1* 8.0* 8.4*  MG 2.1 1.8  --   --   --    GFR: Estimated Creatinine Clearance: 21.2 mL/min (A) (by C-G formula based on SCr of 1.76 mg/dL (H)). Liver Function Tests: Recent Labs  Lab 02/18/21 2118 02/19/21 0245 02/20/21 0745 02/21/21 0132 02/22/21 1359  AST 79* 73* 51* 39 25  ALT 81* 78* 59* 56* 46*  ALKPHOS 220* 209* 213* 208* 174*  BILITOT 6.2* 5.5* 5.5* 4.4* 3.3*  PROT 5.7* 5.7* 5.4* 5.6* 5.7*  ALBUMIN 2.4* 2.3* 2.2* 2.3* 2.3*   Recent Labs  Lab 02/18/21 2118  LIPASE 56*   Recent Labs  Lab 02/19/21 0139  AMMONIA 23   Coagulation Profile: Recent Labs  Lab 02/19/21 0245  INR 1.4*   Cardiac Enzymes: No results for input(s): CKTOTAL, CKMB, CKMBINDEX, TROPONINI in the last 168 hours. BNP (last 3 results) No results for input(s): PROBNP in the last 8760  hours. HbA1C: No results for input(s): HGBA1C in the last 72 hours. CBG: No results for input(s): GLUCAP in the last 168 hours. Lipid Profile: No results for input(s): CHOL, HDL, LDLCALC, TRIG, CHOLHDL, LDLDIRECT in the last 72 hours. Thyroid Function Tests: No results for input(s): TSH, T4TOTAL, FREET4, T3FREE, THYROIDAB in the last 72 hours. Anemia Panel: No results for input(s): VITAMINB12, FOLATE, FERRITIN, TIBC, IRON, RETICCTPCT in the last 72 hours. Sepsis Labs: Recent Labs  Lab 02/18/21 2119  LATICACIDVEN 1.2    Recent Results (from the past 240 hour(s))  SARS CORONAVIRUS 2 (TAT 6-24 HRS) Nasopharyngeal Nasopharyngeal Swab     Status: Abnormal   Collection Time: 02/19/21  1:03 AM   Specimen: Nasopharyngeal Swab  Result Value Ref Range Status   SARS Coronavirus 2 POSITIVE (A) NEGATIVE Final    Comment: (NOTE) SARS-CoV-2 target nucleic acids are DETECTED.  The SARS-CoV-2 RNA is generally detectable in upper and lower respiratory specimens during the acute phase of infection. Positive results are indicative of the presence of SARS-CoV-2 RNA. Clinical correlation with patient history and other diagnostic information is  necessary to determine patient infection status. Positive results do not rule out bacterial infection or co-infection with other viruses.  The expected result is Negative.  Fact Sheet for Patients: SugarRoll.be  Fact Sheet for Healthcare Providers: https://www.woods-mathews.com/  This test is not yet approved or cleared by the [place] FDA and  has been authorized for detection and/or diagnosis of SARS-CoV-2 by FDA under an Emergency Use Authorization (EUA). This EUA will remain  in effect (meaning this test can be used) for the duration of the COVID-19 declaration under Section 564(b)(1) of the Act, 21 U. S.C. section 360bbb-3(b)(1), unless the authorization is terminated or revoked sooner.   Performed  at [facility], [place] [address] [place], [place] [address]   MRSA PCR Screening     Status: None   Collection Time: 02/20/21  9:40 PM  Result Value Ref Range Status   MRSA by PCR NEGATIVE NEGATIVE Final    Comment:        The GeneXpert MRSA Assay (FDA approved for NASAL specimens only), is one component of a comprehensive MRSA colonization surveillance program. It is not intended to diagnose MRSA infection nor to guide or monitor treatment for MRSA infections. Performed at [facility], [place] [address], [place], [place] [address]       Imaging Studies   DG ERCP  Result Date: 02/21/2021 CLINICAL DATA:  Choledocholithiasis post cholecystectomy EXAM: ERCP TECHNIQUE: Multiple spot images obtained with the fluoroscopic device and submitted for interpretation post-procedure. COMPARISON:  MRCP 02/19/2021 FINDINGS: A series of fluoroscopic spot images document endoscopic cannulation and partial opacification of the CBD with withdrawal of balloon catheter through the length of the CBD. The intrahepatic ducts are incompletely opacified, peer Ng decompressed centrally. Cholecystectomy clips are noted. No extravasation. IMPRESSION: Endoscopic CBD cannulation and intervention as above. These images were submitted for radiologic interpretation only. Please see the procedural report for the amount of contrast and the fluoroscopy time utilized. Electronically Signed   By: Lucrezia Europe M.D.   On: 02/21/2021 11:32     Medications   Scheduled Meds:  doxazosin  2 mg Oral Daily   fluticasone  2 spray Each Nare BID   gabapentin  200 mg Oral QHS   levothyroxine  50 mcg Oral QAC breakfast   pantoprazole  40 mg Oral Daily   Continuous Infusions:     LOS: 2 days    Time spent: 25 minutes     Ezekiel Slocumb, DO [facility]  02/22/2021, 3:38 PM      If 7PM-7AM, please contact night-coverage. How to contact the [facility] Attending or Consulting provider [place] or covering  provider during after hours [place], for this patient?    Check the care team in [facility] and look for a) attending/consulting [facility] provider listed and b) the [facility] team listed Log into [email or web address] and  use [place]'s universal password to access. If you do not have the password, please contact the hospital operator. Locate the [facility] provider you are looking for under [facility] and page to a number that you can be directly reached. If you still have difficulty reaching the provider, please page the [facility] (Director on Call) for the Hospitalists listed on amion for assistance.

## 2021-02-22 NOTE — Progress Notes (Addendum)
Patient ID: Gary [name], male   DOB: 1915/11/05, 87 y.o.   MRN: [id number]    Progress Note   Subjective  Day # 3  CC; jaundice  ERCP 02/20/21-with sphincterotomy and stone removal  Labs pending today 02/22/2019 2T bili 4.4/alk phos 208/AST 39/ALT 56-improved WBC 6.0/hemoglobin 12.1  Family at bedside, patient alert but says he is sleepy, he has no complaints of abdominal pain and would like regular food Family hoping for him to be discharged back to [facility] today where he is in skilled care.   Objective   Vital signs in last 24 hours: Temp:  [98 F (36.7 C)-98.5 F (36.9 C)] 98.5 F (36.9 C) (06/20 0742) Pulse Rate:  [51-57] 57 (06/20 0742) Resp:  [15] 15 (06/20 0742) BP: (117-122)/(49-50) 117/49 (06/20 0742) SpO2:  [98 %-100 %] 100 % (06/20 0742) Last BM Date: 02/21/21 General:    Very elderly white male in NAD mild scleral icterus Heart:  Regular rate and rhythm; no murmurs Lungs: Respirations even and unlabored, lungs CTA bilaterally Abdomen:  Soft, nontender and nondistended. Normal bowel sounds. Extremities:  Without edema. Neurologic:  Alert and oriented,  grossly normal neurologically. Psych:  Cooperative. Normal mood and affect.  Intake/Output from previous day: 06/19 0701 - 06/20 0700 In: 200 [I.V.:200] Out: 600 [Urine:600] Intake/Output this shift: Total I/O In: 120 [P.O.:120] Out: -   Lab Results: Recent Labs    02/20/21 0745  WBC 6.0  HGB 12.1*  HCT 37.2*  PLT 151   BMET Recent Labs    02/20/21 0745 02/21/21 0132  NA 140 139  K 3.4* 3.6  CL 108 106  CO2 23 26  GLUCOSE 93 117*  BUN 38* 36*  CREATININE 1.78* 1.75*  CALCIUM 8.1* 8.0*   LFT Recent Labs    02/21/21 0132  PROT 5.6*  ALBUMIN 2.3*  AST 39  ALT 56*  ALKPHOS 208*  BILITOT 4.4*   PT/INR No results for input(s): LABPROT, INR in the last 72 hours.  Studies/Results: DG ERCP  Result Date: 02/21/2021 CLINICAL DATA:  Choledocholithiasis post cholecystectomy EXAM:  ERCP TECHNIQUE: Multiple spot images obtained with the fluoroscopic device and submitted for interpretation post-procedure. COMPARISON:  MRCP 02/19/2021 FINDINGS: A series of fluoroscopic spot images document endoscopic cannulation and partial opacification of the CBD with withdrawal of balloon catheter through the length of the CBD. The intrahepatic ducts are incompletely opacified, peer Ng decompressed centrally. Cholecystectomy clips are noted. No extravasation. IMPRESSION: Endoscopic CBD cannulation and intervention as above. These images were submitted for radiologic interpretation only. Please see the procedural report for the amount of contrast and the fluoroscopy time utilized. Electronically Signed   By: Lucrezia Europe M.D.   On: 02/21/2021 11:32       Assessment / Plan:    #54 87 year old male admitted with abdominal pain and elevated LFTs and found on imaging to have choledocholithiasis.  Status post remote cholecystectomy.  Patient underwent ERCP with sphincterotomy and stone removal on 02/20/2021 Has been stable since, LFTs trending down yesterday and labs are pending this morning  Patient feels well  Plan; advance to regular diet Follow-up on this morning's labs, as long as LFTs stable to improving he is okay to be discharged back to [facility] later today Would repeat LFTs in 3 to 4 days to ensure continued downward trend    LOS: 2 days   Amy Esterwood  02/22/2021, 11:10 AM     Attending physician  Patient is improved.  LFTs continue to trend  down back to [facility] tomorrow.  Plan as above.  Gatha Mayer, MD, [facility] 02/22/2021 5:10 PM

## 2021-02-23 LAB — COMPREHENSIVE METABOLIC PANEL
ALT: 40 U/L (ref 0–44)
AST: 20 U/L (ref 15–41)
Albumin: 2.3 g/dL — ABNORMAL LOW (ref 3.5–5.0)
Alkaline Phosphatase: 158 U/L — ABNORMAL HIGH (ref 38–126)
Anion gap: 11 (ref 5–15)
BUN: 33 mg/dL — ABNORMAL HIGH (ref 8–23)
CO2: 24 mmol/L (ref 22–32)
Calcium: 8.5 mg/dL — ABNORMAL LOW (ref 8.9–10.3)
Chloride: 107 mmol/L (ref 98–111)
Creatinine, Ser: 1.73 mg/dL — ABNORMAL HIGH (ref 0.61–1.24)
GFR, Estimated: 34 mL/min — ABNORMAL LOW (ref >60)
Glucose, Bld: 101 mg/dL — ABNORMAL HIGH (ref 70–99)
Potassium: 3.9 mmol/L (ref 3.5–5.1)
Sodium: 142 mmol/L (ref 135–145)
Total Bilirubin: 2.9 mg/dL — ABNORMAL HIGH (ref 0.3–1.2)
Total Protein: 5.5 g/dL — ABNORMAL LOW (ref 6.5–8.1)

## 2021-02-23 LAB — MAGNESIUM: Magnesium: 2.1 mg/dL (ref 1.7–2.4)

## 2021-02-23 MED ORDER — APIXABAN 2.5 MG PO TABS: 2.5000 mg | ORAL_TABLET | Freq: Two times a day (BID) | ORAL | Status: AC

## 2021-02-23 NOTE — Discharge Summary (Signed)
Physician Discharge Summary  Gary [name]:[id number] DOB: [birth year] DOA: 02/18/2021  PCP: Virgie Dad, MD  Admit date: 02/18/2021 Discharge date: 02/23/2021  Admitted From: SNF Disposition:  SNF  Recommendations for Outpatient Follow-up:  Follow up with PCP in 1-2 weeks Please obtain LFT's in 3-4 days to ensure further improvement. Follow up on patient's Blood Pressure. His Lasix (and potassium supplement) and losartan were held during admission and patient's BP was controlled. Stopped these on discharge to prevent hypotension.  Monitor BP and resume when indicated.    Home Health: No  Equipment/Devices: None   Discharge Condition: Stable  CODE STATUS: DNR  Diet recommendation: Regular    Discharge Diagnoses: Principal Problem:   Jaundice Active Problems:   Atrial fibrillation, chronic (HCC)   Essential hypertension, benign   Chronic kidney disease, stage 3b (HCC)   Goals of care, counseling/discussion   BPH (benign prostatic hyperplasia)   Hypothyroidism   GERD without esophagitis   Prolonged Q-T interval on ECG   Pressure injury of skin of coccygeal region   Choledocholithiasis    Summary of HPI and Hospital Course:   87 y.o. male admitted on 02/19/21 after presenting from SNF with jaundice and abnormal LFT's.  He reported several week history of general malaise and weakness, poor appetite which attributed to having Covid-19 over a month ago.     Found to have choledocholithiasis on MRCP.   GI was consulted and took patient for ERCP on Sunday 02/21/21 with successful removal of CBD stone and sphincterotomy to hopefully prevent any future obstruction.  Patient has done very well post-procedure and LFT's are improving.  Diet was advanced to regular and patient tolerating well.  He has not had any abdominal pain or nausea/vomiting.  He is stable and cleared by GI for return to SNF today.     Choledocholithiasis - GI, Dr. Rush Landmark consulted.   6/19:  underwent ERCP --See GI recommendations --Diet advanced to regular per GI --Repeat LFT's in 3-4 days --resumed on Eliquis post-procedure     Chronic Atrial Fibrillation - Continue Eliquis.  Rate controlled.    CKD stage 3b - renal function near baseline, mild elevation not meeting AKI criteria on admission. --Off IV fluids given for maintenance hydration until PO intake improved --Recheck labs in 1 week   BPH - continue Cardura  GERD - on Protonix   Hypothyroidism - continue Synthroid   Prolonged Qtc - on admission EKG. Tele.  Goal K>4, Mg>2.   Ambulatory dysfunction / Generalized weakness - mostly due to advanced age, co-morbidities.  From [facility] and now in skilled setting, transferred from ALF only a few months ago.   Goals of care - reviewed discussion that were had on admission and discussed in depth with patient and family.  DNR code status confirmed.     Patient BMI: Body mass index is 26.96 kg/m.    Discharge Instructions   Discharge Instructions     Call MD for:  extreme fatigue   Complete by: As directed    Call MD for:  persistant dizziness or light-headedness   Complete by: As directed    Call MD for:  persistant nausea and vomiting   Complete by: As directed    Call MD for:  severe uncontrolled pain   Complete by: As directed    Call MD for:  temperature >100.4   Complete by: As directed    Discharge instructions   Complete by: As directed    BLOOD PRESSURE -  I have stopped your Lasix, losartan and potassium supplement for now because your blood pressures have been normal in the hospital with these medications held.  We do not want to cause LOW blood pressure.   Please monitor BP and follow up with your primary doctor, as these might need to be restarted.  LABS - please have Liver Function Tests checked in 3-4 days.   Discharge wound care:   Complete by: As directed    Per wound care RN orders, as above   Increase activity slowly   Complete by:  As directed       Allergies as of 02/23/2021       Reactions   Other Other (See Comments)   Peppers. Unknown reaction   Plavix [clopidogrel Bisulfate] Other (See Comments)   Indigestion, GI upset        Medication List     STOP taking these medications    furosemide 40 MG tablet Commonly known as: LASIX   metoprolol tartrate 12.5 mg Tabs tablet Commonly known as: LOPRESSOR   potassium chloride 20 MEQ/15ML (10%) Soln       TAKE these medications    Acetaminophen 500 MG capsule Take 1 capsule by mouth at bedtime. And 2 by mouth as needed through out the day for pain   apixaban 2.5 MG Tabs tablet Commonly known as: Eliquis Take 1 tablet (2.5 mg total) by mouth 2 (two) times daily. For Afib and stroke prophylaxis   CeraVe Daily Moisturizing Lotn Apply topically. Apply to legs/feet before compression socks   doxazosin 2 MG tablet Commonly known as: Cardura Take 1 tablet (2 mg total) by mouth daily.   fluticasone 50 MCG/ACT nasal spray Commonly known as: FLONASE Place 2 sprays into both nostrils 2 (two) times daily.   gabapentin 100 MG capsule Commonly known as: NEURONTIN Take 200 mg by mouth at bedtime.   Lactase 9000 units Chew chew 1 tab (9,000 u) by mouth w/ first bite of milk product PRN lactose intolerant.   Levothyroxine Sodium 50 MCG Caps Take 1 capsule (50 mcg total) by mouth daily before breakfast.   Melatonin 3 MG Caps Take 1 capsule (3 mg total) by mouth at bedtime.   mupirocin ointment 2 % Commonly known as: BACTROBAN Apply 1 application topically as needed. Apply to left buttock skin tag, no more than 2 times a day until healed   OcuSoft Eyelid Cleansing Pads Apply 1 application topically as directed.   omeprazole 20 MG tablet Commonly known as: PRILOSEC OTC Take 20 mg by mouth daily.   ondansetron 4 MG tablet Commonly known as: ZOFRAN Take 4 mg by mouth every 8 (eight) hours as needed for nausea or vomiting.   Polyvinyl  Alcohol-Povidone PF 1.4-0.6 % Soln Apply 1.4 % to eye 2 (two) times daily.   promethazine 25 MG tablet Commonly known as: PHENERGAN Take 25 mg by mouth every 6 (six) hours as needed for nausea or vomiting (If vomiting persists after 2 doses, notify MD/NP.).   trolamine salicylate 10 % cream Commonly known as: ASPERCREME Apply 1 application topically 2 (two) times daily as needed.   vitamin B-12 1000 MCG tablet Commonly known as: CYANOCOBALAMIN Take 1,000 mcg by mouth daily.               Discharge Care Instructions  (From admission, onward)           Start     Ordered   02/23/21 0000  Discharge wound care:  Comments: Per wound care RN orders, as above   02/23/21 1053            Allergies  Allergen Reactions   Other Other (See Comments)    Peppers. Unknown reaction   Plavix [Clopidogrel Bisulfate] Other (See Comments)    Indigestion, GI upset     If you experience worsening of your admission symptoms, develop shortness of breath, life threatening emergency, suicidal or homicidal thoughts you must seek medical attention immediately by calling 911 or calling your MD immediately  if symptoms less severe.    Please note   You were cared for by a hospitalist during your hospital stay. If you have any questions about your discharge medications or the care you received while you were in the hospital after you are discharged, you can call the unit and asked to speak with the hospitalist on call if the hospitalist that took care of you is not available. Once you are discharged, your primary care physician will handle any further medical issues. Please note that NO REFILLS for any discharge medications will be authorized once you are discharged, as it is imperative that you return to your primary care physician (or establish a relationship with a primary care physician if you do not have one) for your aftercare needs so that they can reassess your need for medications  and monitor your lab values.   Consultations: Gastroenterology - Dr. Rush Landmark    Procedures/Studies: CT ABDOMEN PELVIS WO CONTRAST  Result Date: 02/19/2021 CLINICAL DATA:  Abdomen distension abnormal labs elevated bilirubin EXAM: CT ABDOMEN AND PELVIS WITHOUT CONTRAST TECHNIQUE: Multidetector CT imaging of the abdomen and pelvis was performed following the standard protocol without IV contrast. COMPARISON:  None. FINDINGS: Lower chest: Lung bases demonstrate trace left pleural effusion. Cardiomegaly with trace pericardial effusion. Mitral calcification. Coronary vascular calcification. Hepatobiliary: No focal hepatic abnormality. Status post cholecystectomy. Extrahepatic common bile duct up to 8 mm. Pancreas: Possible mild peripancreatic fat stranding. No ductal dilatation. Suspected 18 mm cyst at the pancreatic neck, series 3, image 30. Spleen: Normal in size without focal abnormality. Adrenals/Urinary Tract: Adrenal glands are normal. Kidneys slightly atrophic. No hydronephrosis. Punctate stones lower pole left kidney. Distended urinary bladder. Stomach/Bowel: Stomach nonenlarged. No dilated small bowel. Diverticular disease of the colon without acute wall thickening. Vascular/Lymphatic: Advanced aortic atherosclerosis. No aneurysm. No suspicious nodes. Reproductive: Enlarged prostate with calcification Other: Mild presacral edema and stranding. No significant pelvic effusion or ascites. Right lateral abdominal wall laxity with bulging of fat and bowel. Musculoskeletal: Degenerative changes. No acute or suspicious osseous abnormality IMPRESSION: 1. Status post cholecystectomy. No intra hepatic biliary dilatation. Slightly enlarged extrahepatic common bile duct, probably due to postsurgical change. Follow-up MRCP if ductal obstruction remains a concern. 2. Slightly indistinct appearance of pancreas, question mild pancreatitis, correlate with appropriate enzymes. Suspected 18 mm cyst at the pancreatic  neck 3. Nonobstructing stones in the left kidney 4. Diverticular disease of the colon without acute inflammation Electronically Signed   By: Donavan Foil M.D.   On: 02/19/2021 00:05   DG Chest 1 View  Result Date: 02/19/2021 CLINICAL DATA:  Pneumonia EXAM: CHEST  1 VIEW COMPARISON:  CT abdomen and pelvis 02/18/2021, chest radiograph 10/23/2016 FINDINGS: Some residual patchy opacities are present in the right mid to lower lung. No visible layering effusion or pneumothorax. Pulmonary vascularity is normally distributed. Cardiomegaly is similar to prior with a calcified, tortuous aorta. Slight rightward mediastinal shift with tracheal tortuosity is also stable. Degenerative changes are present  in the imaged spine and shoulders. Telemetry leads overlie the chest. IMPRESSION: 1. Some residual ill-defined opacities present in the right mid to lower lung may be atelectatic or related to infectious or inflammatory consolidation. 2. Enlarged cardiac silhouette may reflect a combination of cardiomegaly and pericardial effusion seen on comparison CT imaging. 3. Aortic Atherosclerosis (ICD10-I70.0). Electronically Signed   By: Lovena Le M.D.   On: 02/19/2021 02:36   DG ERCP  Result Date: 02/21/2021 CLINICAL DATA:  Choledocholithiasis post cholecystectomy EXAM: ERCP TECHNIQUE: Multiple spot images obtained with the fluoroscopic device and submitted for interpretation post-procedure. COMPARISON:  MRCP 02/19/2021 FINDINGS: A series of fluoroscopic spot images document endoscopic cannulation and partial opacification of the CBD with withdrawal of balloon catheter through the length of the CBD. The intrahepatic ducts are incompletely opacified, peer Ng decompressed centrally. Cholecystectomy clips are noted. No extravasation. IMPRESSION: Endoscopic CBD cannulation and intervention as above. These images were submitted for radiologic interpretation only. Please see the procedural report for the amount of contrast and the  fluoroscopy time utilized. Electronically Signed   By: Lucrezia Europe M.D.   On: 02/21/2021 11:32   MR ABDOMEN MRCP W WO CONTAST  Result Date: 02/19/2021 CLINICAL DATA:  Dilated LFTs EXAM: MRI ABDOMEN WITHOUT AND WITH CONTRAST (INCLUDING MRCP) TECHNIQUE: Multiplanar multisequence MR imaging of the abdomen was performed both before and after the administration of intravenous contrast. Heavily T2-weighted images of the biliary and pancreatic ducts were obtained, and three-dimensional MRCP images were rendered by post processing. CONTRAST:  52mL GADAVIST GADOBUTROL 1 MMOL/ML IV SOLN COMPARISON:  CT abdomen/pelvis dated 02/18/2021 FINDINGS: Lower chest: Scarring/atelectasis at the left lung base. Cardiomegaly. Hepatobiliary: Liver is within normal limits. No hepatic steatosis. No suspicious/enhancing hepatic lesions. Status post cholecystectomy. No intrahepatic ductal dilatation. Dilated common duct, measuring 10 mm (series 11/image 20), with at least four mid/distal CBD stones (series 11/images 18-19) measuring up to 9 mm (series 7/image 16). Two additional cystic duct remnant stones measuring up to 7 mm (series 7/image 23). Pancreas: Multiple nonenhancing pancreatic cystic lesions, including a dominant 17 mm cyst in the pancreatic body (series 7/image 22). No enhancement following contrast administration. No main pancreatic ductal dilatation. Spleen:  Within normal limits. Adrenals/Urinary Tract:  Adrenal glands are within normal limits. 5 mm hemorrhagic cyst in the left upper kidney (series 1300/image 32). 13 mm right lower pole renal cyst (series 7/image 27). No enhancing renal lesions. No hydronephrosis. Stomach/Bowel: Stomach is within normal limits. Visualized bowel is notable for left colonic diverticulosis, without evidence of diverticulitis. Vascular/Lymphatic:  No evidence of abdominal aortic aneurysm. No suspicious abdominal lymphadenopathy. Other:  No abdominal ascites. Musculoskeletal: No focal osseous  lesions. IMPRESSION: Status post cholecystectomy. Mildly dilated common duct, measuring 10 mm. Choledocholithiasis with multiple mid/distal CBD stones measuring up to 9 mm and two cystic duct remnant stones measuring up to 7 mm, as above. Multiple nonenhancing pancreatic cysts, measuring up to 17 mm in the pancreatic body. These likely reflect benign pseudocysts or side branch IPMNs. Given the patient's age, routine follow-up is not recommended. Electronically Signed   By: Julian Hy M.D.   On: 02/19/2021 10:41    ERCP on 6/18 -->  Impression:  - The major papilla appeared normal. - The entire main bile duct was moderately dilated. - Filling defects consistent with stones and sludge was seen on the cholangiogram. - Choledocholithiasis was found. Complete removal was accomplished by biliary sphincterotomy and balloon sweep.   Subjective: Pt seen with daughters at bedside this AM.  He is drowsy and nods off, but wakes and engages easily.  O2 not connected and sats near 100%.  He removed his O2 sensor on ear lobe as it was bothering him.  He is eager to leave today.   Discharge Exam: Vitals:   02/22/21 2136 02/23/21 0750  BP: (!) 112/51 (!) 136/57  Pulse: (!) 51 (!) 58  Resp: 16 18  Temp: 97.7 F (36.5 C) 97.7 F (36.5 C)  SpO2: 99% 100%   Vitals:   02/22/21 0742 02/22/21 1529 02/22/21 2136 02/23/21 0750  BP: (!) 117/49 (!) 119/56 (!) 112/51 (!) 136/57  Pulse: (!) 57 89 (!) 51 (!) 58  Resp: 15 15 16 18   Temp: 98.5 F (36.9 C) (!) 97.5 F (36.4 C) 97.7 F (36.5 C) 97.7 F (36.5 C)  TempSrc:   Oral Oral  SpO2: 100% 98% 99% 100%  Weight:      Height:        General: Pt is awake but drowsy, not in acute distress Cardiovascular: bradycardic, no peripheral edema Respiratory: normal respiratory effort, spO2 96-100% on room air during encounter Abdominal: Soft, NT, ND Extremities: no edema, no cyanosis    The results of significant diagnostics from this hospitalization  (including imaging, microbiology, ancillary and laboratory) are listed below for reference.     Microbiology: Recent Results (from the past 240 hour(s))  SARS CORONAVIRUS 2 (TAT 6-24 HRS) Nasopharyngeal Nasopharyngeal Swab     Status: Abnormal   Collection Time: 02/19/21  1:03 AM   Specimen: Nasopharyngeal Swab  Result Value Ref Range Status   SARS Coronavirus 2 POSITIVE (A) NEGATIVE Final    Comment: (NOTE) SARS-CoV-2 target nucleic acids are DETECTED.  The SARS-CoV-2 RNA is generally detectable in upper and lower respiratory specimens during the acute phase of infection. Positive results are indicative of the presence of SARS-CoV-2 RNA. Clinical correlation with patient history and other diagnostic information is  necessary to determine patient infection status. Positive results do not rule out bacterial infection or co-infection with other viruses.  The expected result is Negative.  Fact Sheet for Patients: SugarRoll.be  Fact Sheet for Healthcare Providers: https://www.woods-mathews.com/  This test is not yet approved or cleared by the [place] FDA and  has been authorized for detection and/or diagnosis of SARS-CoV-2 by FDA under an Emergency Use Authorization (EUA). This EUA will remain  in effect (meaning this test can be used) for the duration of the COVID-19 declaration under Section 564(b)(1) of the Act, 21 U. S.C. section 360bbb-3(b)(1), unless the authorization is terminated or revoked sooner.   Performed at [facility], [place] [address], [place], [place] [address]   MRSA PCR Screening     Status: None   Collection Time: 02/20/21  9:40 PM  Result Value Ref Range Status   MRSA by PCR NEGATIVE NEGATIVE Final    Comment:        The GeneXpert MRSA Assay (FDA approved for NASAL specimens only), is one component of a comprehensive MRSA colonization surveillance program. It is not intended to diagnose  MRSA infection nor to guide or monitor treatment for MRSA infections. Performed at [facility], [place] [address] [place], [place] [address]      Labs: BNP (last 3 results) No results for input(s): BNP in the last 8760 hours. Basic Metabolic Panel: Recent Labs  Lab 02/18/21 2118 02/19/21 0245 02/20/21 0745 02/21/21 0132 02/22/21 1359 02/23/21 0402  NA 142 139 140 139 139 142  K 3.8  3.3* 3.4* 3.6 3.7 3.9  CL 104 102 108 106 105 107  CO2 27 26 23 26 29 24   GLUCOSE 108* 97 93 117* 116* 101*  BUN 51* 46* 38* 36* 33* 33*  CREATININE 2.20* 2.03* 1.78* 1.75* 1.76* 1.73*  CALCIUM 8.1* 8.2* 8.1* 8.0* 8.4* 8.5*  MG 2.1 1.8  --   --   --  2.1   Liver Function Tests: Recent Labs  Lab 02/19/21 0245 02/20/21 0745 02/21/21 0132 02/22/21 1359 02/23/21 0402  AST 73* 51* 39 25 20  ALT 78* 59* 56* 46* 40  ALKPHOS 209* 213* 208* 174* 158*  BILITOT 5.5* 5.5* 4.4* 3.3* 2.9*  PROT 5.7* 5.4* 5.6* 5.7* 5.5*  ALBUMIN 2.3* 2.2* 2.3* 2.3* 2.3*   Recent Labs  Lab 02/18/21 2118  LIPASE 56*   Recent Labs  Lab 02/19/21 0139  AMMONIA 23   CBC: Recent Labs  Lab 02/18/21 2118 02/19/21 0245 02/20/21 0745 02/22/21 1359  WBC 5.6 5.2 6.0 7.2  NEUTROABS  --  4.0  --   --   HGB 11.8* 11.5* 12.1* 11.4*  HCT 37.0* 35.2* 37.2* 37.2*  MCV 98.7 97.2 96.6 101.6*  PLT 145* 146* 151 165   Cardiac Enzymes: No results for input(s): CKTOTAL, CKMB, CKMBINDEX, TROPONINI in the last 168 hours. BNP: Invalid input(s): POCBNP CBG: No results for input(s): GLUCAP in the last 168 hours. D-Dimer No results for input(s): DDIMER in the last 72 hours. Hgb A1c No results for input(s): HGBA1C in the last 72 hours. Lipid Profile No results for input(s): CHOL, HDL, LDLCALC, TRIG, CHOLHDL, LDLDIRECT in the last 72 hours. Thyroid function studies No results for input(s): TSH, T4TOTAL, T3FREE, THYROIDAB in the last 72 hours.  Invalid input(s): FREET3 Anemia work up No results for input(s):  VITAMINB12, FOLATE, FERRITIN, TIBC, IRON, RETICCTPCT in the last 72 hours. Urinalysis No results found for: COLORURINE, APPEARANCEUR, [place], [place], [place], [place], [place], [place], PROTEINUR, UROBILINOGEN, NITRITE, LEUKOCYTESUR Sepsis Labs Invalid input(s): PROCALCITONIN,  WBC,  LACTICIDVEN Microbiology Recent Results (from the past 240 hour(s))  SARS CORONAVIRUS 2 (TAT 6-24 HRS) Nasopharyngeal Nasopharyngeal Swab     Status: Abnormal   Collection Time: 02/19/21  1:03 AM   Specimen: Nasopharyngeal Swab  Result Value Ref Range Status   SARS Coronavirus 2 POSITIVE (A) NEGATIVE Final    Comment: (NOTE) SARS-CoV-2 target nucleic acids are DETECTED.  The SARS-CoV-2 RNA is generally detectable in upper and lower respiratory specimens during the acute phase of infection. Positive results are indicative of the presence of SARS-CoV-2 RNA. Clinical correlation with patient history and other diagnostic information is  necessary to determine patient infection status. Positive results do not rule out bacterial infection or co-infection with other viruses.  The expected result is Negative.  Fact Sheet for Patients: SugarRoll.be  Fact Sheet for Healthcare Providers: https://www.woods-mathews.com/  This test is not yet approved or cleared by the [place] FDA and  has been authorized for detection and/or diagnosis of SARS-CoV-2 by FDA under an Emergency Use Authorization (EUA). This EUA will remain  in effect (meaning this test can be used) for the duration of the COVID-19 declaration under Section 564(b)(1) of the Act, 21 U. S.C. section 360bbb-3(b)(1), unless the authorization is terminated or revoked sooner.   Performed at [facility], [place] [address] [place], [place] [address]   MRSA PCR Screening     Status: None   Collection Time: 02/20/21  9:40 PM  Result Value Ref Range Status   MRSA  by PCR NEGATIVE NEGATIVE Final     Comment:        The GeneXpert MRSA Assay (FDA approved for NASAL specimens only), is one component of a comprehensive MRSA colonization surveillance program. It is not intended to diagnose MRSA infection nor to guide or monitor treatment for MRSA infections. Performed at [facility], [place] [address], [place], [place] [address]      Time coordinating discharge: Over 30 minutes  SIGNED:   Ezekiel Slocumb, DO [facility] 02/23/2021, 10:53 AM   If 7PM-7AM, please contact night-coverage [email or web address]

## 2021-02-23 NOTE — TOC Transition Note (Addendum)
Transition of Care [facility]) - CM/SW Discharge Note   Patient Details  Name: Gary [name] MRN: [id number] Date of Birth: June 20, 1916  Transition of Care [facility]) CM/SW Contact:  Gabrielle Dare Phone Number: 02/23/2021, 12:14 PM   Clinical Narrative:    Patient will Discharge To: [facility] Anticipated DC Date:02/23/21 Family Notified:yes, daughter, Gary [name], [phone] Transport ER:[id number]   Per MD patient ready for DC to [facility]. RN, patient, patient's family, and facility notified of DC. Assessment, Fl2/Pasrr, and Discharge Summary sent to facility. RN given number for report [phone], Room [address] [place] [address]). DC packet on chart. Ambulance transport requested for patient.   CSW signing off.  Reed Breech LCSWA [phone]     Final next level of care: Skilled Nursing Facility Barriers to Discharge: No Barriers Identified   Patient Goals and CMS Choice        Discharge Placement              Patient chooses bed at:  [facility])   Name of family member notified: Gary [name] Patient and family notified of of transfer: 02/23/21  Discharge Plan and Services                                     Social Determinants of Health (SDOH) Interventions     Readmission Risk Interventions No flowsheet data found.

## 2021-02-23 NOTE — Progress Notes (Signed)
Occupational Therapy Evaluation Patient Details Name: Gary [name] MRN: [id number] DOB: [birth year] Today's Date: 02/23/2021    History of Present Illness 87 yo male presents to [facility] on 6/17 from [facility] SNF with jaundice, abnormal LFTs. + choledocholithiasis, s/p ERCP on 6/19 with removal of CBD stone and sphinterectomy. PMH includes atrial fibrillation, chronic renal insufficiency, BPH, arthritis, GERD, status postcholecystectomy, HTN.   Clinical Impression   Rush Landmark was evaluated s/p the above impairments. PTA he was in skilled care at [facility] where he was total A for all transfers, mobilized with PWC, and received mod/max A for all ADLs. Upon evaluation, pt is performing bed mobility and ADLs at his baseline (see details below). He is eager to return to [facility]. Pt does not required further acute OT services as he his at his baseline function. Recommend d/c back to his SNF.     Follow Up Recommendations  SNF;Supervision/Assistance - 24 hour;Other (comment) (Recommend d/c back to skilled care at [facility])    Equipment Recommendations  None recommended by OT       Precautions / Restrictions Precautions Precautions: Fall Restrictions Weight Bearing Restrictions: No      Mobility Bed Mobility Overal bed mobility: Needs Assistance Bed Mobility: Supine to Sit;Sit to Supine     Supine to sit: Max assist;+2 for physical assistance;HOB elevated Sit to supine: Max assist;+2 for physical assistance;HOB elevated        Transfers Overall transfer level: Needs assistance               General transfer comment: defer - pt is hoyer lift at baseline        ADL either performed or assessed with clinical judgement   ADL Overall ADL's : Needs assistance/impaired Eating/Feeding: Moderate assistance;Bed level   Grooming: Brushing hair;Oral care;Wash/dry face;Wash/dry hands;Moderate assistance;Bed level   Upper Body Bathing: Moderate assistance;Bed level   Lower  Body Bathing: Total assistance;Bed level   Upper Body Dressing : Maximal assistance;Sitting   Lower Body Dressing: Total assistance;+2 for physical assistance;Bed level   Toilet Transfer: Total assistance   Toileting- Clothing Manipulation and Hygiene: Total assistance       Functional mobility during ADLs: Total assistance General ADL Comments: pt is performing ADLs at his baseline     Pertinent Vitals/Pain Pain Assessment: Faces Faces Pain Scale: Hurts little more Pain Location: L knee & generalized Pain Descriptors / Indicators: Discomfort;Grimacing;Guarding Pain Intervention(s): Limited activity within patient's tolerance        Extremity/Trunk Assessment Upper Extremity Assessment Upper Extremity Assessment: Generalized weakness (some pain in R shoulder with overhead reaching overall WFL for baseline tasks)   Lower Extremity Assessment Lower Extremity Assessment: Defer to PT evaluation   Cervical / Trunk Assessment Cervical / Trunk Assessment: Kyphotic   Communication Communication Communication: HOH   Cognition Arousal/Alertness: Awake/alert Behavior During Therapy: WFL for tasks assessed/performed Overall Cognitive Status: Within Functional Limits for tasks assessed     General Comments: Pt directs his care well   General Comments  VSS on RA - no new concerns, family present during evaluation and eager for pt to return to [facility] expects to be discharged to:: Assisted living         Additional Comments: Pt transitioned to skilled care at [facility] in March, was previously at [facility] prior - expected d/c back to skilled care      Prior Functioning/Environment Level of Independence: Needs assistance  Gait / Transfers Assistance Needed: Total A, hoyer transfers,  mobilized with [place] ADL's / Homemaking Assistance Needed: Total A Communication / Swallowing Assistance Needed: Assistance with feeding,  HOH          OT Problem List: Decreased strength;Decreased range of motion;Decreased activity tolerance;Impaired balance (sitting and/or standing);Decreased safety awareness;Decreased knowledge of use of DME or AE;Pain      OT Treatment/Interventions:      OT Goals(Current goals can be found in the care plan section) Acute Rehab OT Goals Patient Stated Goal: [facility] today      Co-evaluation PT/OT/SLP Co-Evaluation/Treatment: Yes Reason for Co-Treatment: Complexity of the patient's impairments (multi-system involvement);For patient/therapist safety;To address functional/ADL transfers   OT goals addressed during session: ADL's and self-care;Strengthening/ROM (bed mobiltiy)      AM-PAC OT "6 Clicks" Daily Activity     Outcome Measure Help from another person eating meals?: A Little Help from another person taking care of personal grooming?: A Little Help from another person toileting, which includes using toliet, bedpan, or urinal?: Total Help from another person bathing (including washing, rinsing, drying)?: Total Help from another person to put on and taking off regular upper body clothing?: A Lot Help from another person to put on and taking off regular lower body clothing?: Total 6 Click Score: 11   End of Session Nurse Communication: Mobility status  Activity Tolerance: Patient tolerated treatment well Patient left: in bed;with call bell/phone within reach;with bed alarm set;with family/visitor present  OT Visit Diagnosis: Pain                Time: 9629-5284 OT Time Calculation (min): 20 min Charges:  OT General Charges $OT Visit: 1 Visit OT Evaluation $OT Eval Moderate Complexity: 1 Mod    Kaithlyn Teagle A Yuval Nolet 02/23/2021, 12:23 PM

## 2021-02-23 NOTE — Progress Notes (Signed)
Discharge summary packet/pertinent documents provided to PTAR. Pt d/c to [facility] as ordered, Report given to [facility] at [facility]. All questions and concerns were fully addressed. Pt remains alert/oriented in no apparent distress. No complaints.

## 2021-02-23 NOTE — Care Management Important Message (Signed)
Important Message  Patient Details  Name: CALIL AMOR MRN: [id number] Date of Birth: 12-05-1915   Medicare Important Message Given:  Yes - Important Message mailed due to current National Emergency   Verbal consent obtained due to current National Emergency  Relationship to patient: Self Contact Name: T[place]      Phone: [phone] Outcome: No Answer/Busy Important Message mailed to: Patient address on file    Delorse Lek 02/23/2021, 3:25 PM

## 2021-02-23 NOTE — Evaluation (Signed)
Physical Therapy Evaluation Patient Details Name: Gary [name] MRN: [id number] DOB: [birth year] Today's Date: 02/23/2021   History of Present Illness  87 yo male presents to [facility] on 6/17 from [place] SNF with jaundice, abnormal LFTs. + choledocholithiasis, s/p ERCP on 6/19 with removal of CBD stone and sphinterectomy. PMH includes atrial fibrillation, chronic renal insufficiency, BPH, arthritis, GERD, status postcholecystectomy, HTN.  Clinical Impression  Pt presents with debility, impaired balance, and inability to ambulate which is pt's baseline. Pt complaining of L knee pain today, which is chronic. Otherwise, pt is at baseline level of functioning as pt lifts in/out of bed, has total care for ADLs. PT recommending return to SNF without PT/OT, pt wishes to maintain current level of functioning and does not want rehabilitation.  PT to sign off, thank you for consult.     Follow Up Recommendations SNF;Other (comment) (return to [facility] SNF, custodial care so no PT/OT)    Equipment Recommendations  None recommended by PT    Recommendations for Other Services       Precautions / Restrictions Precautions Precautions: Fall Restrictions Weight Bearing Restrictions: No      Mobility  Bed Mobility Overal bed mobility: Needs Assistance Bed Mobility: Supine to Sit;Sit to Supine     Supine to sit: Max assist;+2 for physical assistance;HOB elevated Sit to supine: Max assist;+2 for physical assistance;HOB elevated   General bed mobility comments: max +2 for trunk and LE management, scooting to/from EOB, and boost up in bed upon return to supine.    Transfers Overall transfer level: Needs assistance               General transfer comment: NT - hoyer lift at baseline  Ambulation/Gait             General Gait Details: NT - does not walk at baseline  Stairs            Wheelchair Mobility    Modified Rankin (Stroke Patients Only)       Balance Overall  balance assessment: Needs assistance Sitting-balance support: Bilateral upper extremity supported;Feet supported Sitting balance-Leahy Scale: Poor Sitting balance - Comments: at least min posterior assist Postural control: Posterior lean     Standing balance comment: NT                             Pertinent Vitals/Pain Pain Assessment: Faces Faces Pain Scale: Hurts little more Pain Location: L knee & generalized Pain Descriptors / Indicators: Discomfort;Grimacing;Guarding Pain Intervention(s): Limited activity within patient's tolerance;Monitored during session;Repositioned    Home Living Family/patient expects to be discharged to:: Skilled nursing facility                 Additional Comments: Pt transitioned to SNF [facility] in March, was previously at [facility] ALF prior - expected d/c back to skilled care    Prior Function Level of Independence: Needs assistance   Gait / Transfers Assistance Needed: Total A, hoyer transfers, mobilized with PWC  ADL's / Homemaking Assistance Needed: Total A        Hand Dominance   Dominant Hand: Right    Extremity/Trunk Assessment   Upper Extremity Assessment Upper Extremity Assessment: Defer to OT evaluation    Lower Extremity Assessment Lower Extremity Assessment: Generalized weakness    Cervical / Trunk Assessment Cervical / Trunk Assessment: Kyphotic  Communication   Communication: HOH  Cognition Arousal/Alertness: Awake/alert Behavior During Therapy: WFL for tasks assessed/performed Overall Cognitive  Status: Within Functional Limits for tasks assessed                                 General Comments: Pt directs his care well, per family he is normally "very sharp" but is slightly altered while in hospital      General Comments General comments (skin integrity, edema, etc.): VSS on RA - no new concerns, family present during evaluation and eager for pt to return to [facility]     Exercises     Assessment/Plan    PT Assessment [place] does not need any further PT services  PT Problem List         PT Treatment Interventions      PT Goals (Current goals can be found in the Care Plan section)  Acute Rehab PT Goals Patient Stated Goal: [facility] today PT Goal Formulation: With patient/family Time For Goal Achievement: 02/23/21 Potential to Achieve Goals: Good    Frequency     Barriers to discharge        Co-evaluation PT/OT/SLP Co-Evaluation/Treatment: Yes Reason for Co-Treatment: For patient/therapist safety;To address functional/ADL transfers;Necessary to address cognition/behavior during functional activity PT goals addressed during session: Mobility/safety with mobility;Balance OT goals addressed during session: ADL's and self-care;Strengthening/ROM (bed mobiltiy)       AM-PAC PT "6 Clicks" Mobility  Outcome Measure Help needed turning from your back to your side while in a flat bed without using bedrails?: A Lot Help needed moving from lying on your back to sitting on the side of a flat bed without using bedrails?: Total Help needed moving to and from a bed to a chair (including a wheelchair)?: Total Help needed standing up from a chair using your arms (e.g., wheelchair or bedside chair)?: Total Help needed to walk in hospital room?: Total Help needed climbing 3-5 steps with a railing? : Total 6 Click Score: 7    End of Session   Activity Tolerance: Patient tolerated treatment well;Patient limited by fatigue Patient left: in bed;with call bell/phone within reach;with family/visitor present (air bed, no alarm, and family present) Nurse Communication: Mobility status PT Visit Diagnosis: Other abnormalities of gait and mobility (R26.89)    Time: 2778-2423 PT Time Calculation (min) (ACUTE ONLY): 23 min   Charges:   PT Evaluation $PT Eval Low Complexity: 1 Low         Stacie Glaze, PT DPT Acute Rehabilitation Services Pager [phone]   Office [phone]   Louis Matte 02/23/2021, 12:49 PM

## 2021-02-24 ENCOUNTER — Encounter: Payer: Self-pay | Admitting: Orthopedic Surgery

## 2021-02-24 ENCOUNTER — Non-Acute Institutional Stay (SKILLED_NURSING_FACILITY): Payer: Medicare Other | Admitting: Orthopedic Surgery

## 2021-02-24 DIAGNOSIS — N1832 Chronic kidney disease, stage 3b: Secondary | ICD-10-CM | POA: Diagnosis not present

## 2021-02-24 DIAGNOSIS — M25511 Pain in right shoulder: Secondary | ICD-10-CM | POA: Diagnosis not present

## 2021-02-24 DIAGNOSIS — E039 Hypothyroidism, unspecified: Secondary | ICD-10-CM

## 2021-02-24 DIAGNOSIS — G8929 Other chronic pain: Secondary | ICD-10-CM | POA: Diagnosis not present

## 2021-02-24 DIAGNOSIS — L89322 Pressure ulcer of left buttock, stage 2: Secondary | ICD-10-CM

## 2021-02-24 DIAGNOSIS — M25561 Pain in right knee: Secondary | ICD-10-CM | POA: Diagnosis not present

## 2021-02-24 DIAGNOSIS — I495 Sick sinus syndrome: Secondary | ICD-10-CM

## 2021-02-24 DIAGNOSIS — I480 Paroxysmal atrial fibrillation: Secondary | ICD-10-CM | POA: Diagnosis not present

## 2021-02-24 DIAGNOSIS — Z9889 Other specified postprocedural states: Secondary | ICD-10-CM | POA: Diagnosis not present

## 2021-02-24 DIAGNOSIS — I509 Heart failure, unspecified: Secondary | ICD-10-CM

## 2021-02-24 DIAGNOSIS — R531 Weakness: Secondary | ICD-10-CM | POA: Diagnosis not present

## 2021-02-24 MED ORDER — PROMETHAZINE HCL 25 MG PO TABS: 12.5000 mg | ORAL_TABLET | Freq: Four times a day (QID) | ORAL | 0 refills | Status: DC | PRN

## 2021-02-24 NOTE — Progress Notes (Signed)
Location:  [place] Room Number: 893 Place of Service:  SNF [phone]) Provider:  Windell Moulding, AGNP-C  Virgie Dad, MD  Patient Care Team: Virgie Dad, MD as PCP - General (Internal Medicine) [facility] [name], DMD as Consulting Physician (Oral Surgery)  Extended Emergency Contact Information Primary Emergency Contact: [facility] Address: [address]          [place], [place] [address] [name] of [place] Phone: [phone] Relation: Daughter Secondary Emergency Contact: Kuzniar,Quency Address: [place], [place] [address] [name] of [place] Phone: [phone] Mobile Phone: [phone] Relation: Son  Code Status: DNR Goals of care: Advanced Directive information Advanced Directives 02/18/2021  Does Patient Have a Medical Advance Directive? No;Yes  Type of Advance Directive Out of facility DNR (pink MOST or yellow form)  Does patient want to make changes to medical advance directive? No - Patient declined  Copy of [place] in Chart? -  Would patient like information on creating a medical advance directive? No - Patient declined  Pre-existing out of facility DNR order (yellow form or pink MOST form) Pink Most/Yellow Form available - Physician notified to receive inpatient order     Chief Complaint  Patient presents with   Hospitalization Follow-up    HPI:  Pt is a 87 y.o. male seen today for f/u s/p hospitalization 06/16- 06/21 at [facility].   He currently resides on the skilled nursing unit at [facility]. Past medical history includes: aortic valve disorder, atrial fibrillation, CHF, HTN, sick sinus syndrome, choledocholithiasis, GERD, hypothyroidism, BPH, CKD stage 3b, constipation and weakness.   06/16 he presented to the ED with jaundice and elevated LFT's. Prior to hospital admission he c/o nausea with vomiting and right  shoulder pain. Bilirubin 3.4, Lipase 56, AST 79, ALT 81 02/18/2021. S/p cholecystectomy. CT abdomen noted slightly enlarged extrahepatic common bile duct, mild pancreatitis, left kidney stones and diverticular disease. GI consulted. ERCP done 02/21/21 with successful removal of CBD stone and sphincterotomy.He tolerated procedure well an discharged back to [facility] 06/21.   Atrial fibrillation- rate controlled, remains on Eliquis for clot prevention Blood pressure- lasix and losartan held during hospitalization d/t hypotension BPH- stable with Cardura GERD- stable with protonix CKD stage 3b- BUN/creat 51/2.20 02/18/21, trending down BUN creat 33/1.73 02/23/21.  Hypothyroidism- remains on synthroid Weakness- d/t advanced age, recent covid infection, and co morbidities, recommend PT/OT eval.   He returned with open area to coccyx measuring 5cm x 5cm. Purple DTI to left heel also noted. . Nursing supervisor and wound nurse notified. Xeroform gauze and sacral dressing in place until wound nurse able to assess. Pro stat ordered to promote wound healing.   Today, he reports feeling much better. He is slowly regaining his appetite. He c/o right shoulder pain and lower back pain that radiates to knee. Asking for something to help with nerve pain.   No recent falls, injuries or behavioral outbursts.   Nurse does not report any other concerns, vitals stable.    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   Hyperglycemia 01/02/2013   Hypertrophy of prostate without  urinary obstruction and other lower urinary tract symptoms (LUTS) 2002   Insomnia with sleep apnea, unspecified 02/2010   Irritable bowel syndrome 11/14/2011   Long term (current) use of anticoagulants 11/2009   Mild cognitive impairment 04/19/2020   Other abnormal blood chemistry 02/09/2009   Other B-complex deficiencies 2008   Other dyspnea and respiratory abnormality 11/2009   Other malaise and fatigue 2007   Pain in joint, lower leg 02/2010   Palpitations 2007   Reflux esophagitis 2007   Sebaceous cyst 2008   Tension headache 2009   Undiagnosed cardiac murmurs 2002   Unspecified constipation 07/04/2012   Unspecified essential hypertension 2007   Unspecified hereditary and idiopathic peripheral neuropathy 02/2010   Unspecified transient cerebral ischemia 2002   Urinary frequency 10/01/2012   Past Surgical History:  Procedure Laterality Date   ENDOSCOPIC RETROGRADE CHOLANGIOPANCREATOGRAPHY (ERCP) WITH PROPOFOL N/A 02/21/2021   Procedure: ENDOSCOPIC RETROGRADE CHOLANGIOPANCREATOGRAPHY (ERCP) WITH PROPOFOL;  Surgeon: Irving Copas., MD;  Location: [place];  Service: Gastroenterology;  Laterality: N/A;   REMOVAL OF STONES  02/21/2021   Procedure: REMOVAL OF STONES;  Surgeon: Rush Landmark Telford Nab., MD;  Location: [place];  Service: Gastroenterology;;   Joan Mayans  02/21/2021   Procedure: Joan Mayans;  Surgeon: Mansouraty, Telford Nab., MD;  Location: [facility] ENDOSCOPY;  Service: Gastroenterology;;    Allergies  Allergen Reactions   Other Other (See Comments)    Peppers. Unknown reaction   Plavix [Clopidogrel Bisulfate] Other (See Comments)    Indigestion, GI upset    Outpatient Encounter Medications as of 02/24/2021  Medication Sig   Acetaminophen 500 MG coapsule Take 1 capsule by mouth at bedtime. And 2 by mouth as needed through out the day for pain   apixaban (ELIQUIS) 2.5 MG TABS tablet Take 1 tablet (2.5 mg total) by mouth 2 (two) times daily. For Afib  and stroke prophylaxis   doxazosin (CARDURA) 2 MG tablet Take 1 tablet (2 mg total) by mouth daily.   Emollient (CERAVE DAILY MOISTURIZING) LOTN Apply topically. Apply to legs/feet before compression socks   Eyelid Cleansers (OCUSOFT EYELID CLEANSING) PADS Apply 1 application topically as directed.   fluticasone (FLONASE) 50 MCG/ACT nasal spray Place 2 sprays into both nostrils 2 (two) times daily.   gabapentin (NEURONTIN) 100 MG capsule Take 200 mg by mouth at bedtime.   Lactase 9000 units CHEW chew 1 tab (9,000 u) by mouth w/ first bite of milk product PRN lactose intolerant.   Levothyroxine Sodium 50 MCG CAPS Take 1 capsule (50 mcg total) by mouth daily before breakfast.   Melatonin 3 MG CAPS Take 1 capsule (3 mg total) by mouth at bedtime.   mupirocin ointment (BACTROBAN) 2 % Apply 1 application topically as needed. Apply to left buttock skin tag, no more than 2 times a day until healed   omeprazole (PRILOSEC OTC) 20 MG tablet Take 20 mg by mouth daily.   ondansetron (ZOFRAN) 4 MG tablet Take 4 mg by mouth every 8 (eight) hours as needed for nausea or vomiting.   Polyvinyl Alcohol-Povidone PF 1.4-0.6 % SOLN Apply 1.4 % to eye 2 (two) times daily.    promethazine (PHENERGAN) 25 MG tablet Take 25 mg by mouth every 6 (six) hours as needed for nausea or vomiting (If vomiting persists after 2 doses, notify MD/NP.).   trolamine salicylate (ASPERCREME) 10 %  cream Apply 1 application topically 2 (two) times daily as needed.   vitamin B-12 (CYANOCOBALAMIN) 1000 MCG tablet Take 1,000 mcg by mouth daily.   No facility-administered encounter medications on file as of 02/24/2021.    Review of Systems  Constitutional:  Negative for activity change, appetite change, fatigue and fever.  HENT:  Positive for hearing loss. Negative for dental problem and sore throat.   Eyes:  Negative for visual disturbance.  Respiratory:  Negative for cough, shortness of breath and wheezing.   Cardiovascular:  Negative for  chest pain and leg swelling.  Gastrointestinal:  Negative for abdominal distention, abdominal pain, constipation, diarrhea and nausea.  Genitourinary:  Negative for dysuria, frequency and hematuria.  Musculoskeletal:  Positive for arthralgias, gait problem and myalgias.       Right shoulder and right knee pain.   Skin:        Pressure sore to sacrum, DTI to left heel  Neurological:  Positive for weakness. Negative for dizziness and headaches.  Psychiatric/Behavioral:  Negative for confusion and dysphoric mood. The patient is not nervous/anxious.    Immunization History  Administered Date(s) Administered   Influenza Inj Mdck Quad Pf 06/23/2016   Influenza, High Dose Seasonal PF 07/04/2019, 06/26/2020, 07/03/2020   Influenza,inj,Quad PF,6+ Mos 07/05/2018   Influenza-Unspecified 06/11/2013, 06/23/2014, 06/25/2015, 06/29/2017, 06/26/2020   Moderna SARS-COV2 Booster Vaccination 07/18/2020   Moderna Sars-Covid-2 Vaccination 09/17/2019, 10/30/2019   Pneumococcal Conjugate-13 09/09/2015   Pneumococcal Polysaccharide-23 09/05/2008   Td 09/05/1978   Tdap 02/23/2017   Zoster Recombinat (Shingrix) 10/02/2017, 03/01/2018   Pertinent  Health Maintenance Due  Topic Date Due   INFLUENZA VACCINE  04/05/2021   PNA vac Low Risk Adult  Completed   Fall Risk  04/19/2020 04/15/2020 04/15/2020 12/11/2019 07/31/2019  Falls in the past year? - 0 0 0 0  Comment - - - - Emmi Telephone Survey: data to providers prior to load  Number falls in past yr: - 0 0 0 -  Injury with Fall? - 0 0 0 -  Risk for fall due to : History of fall(s);Impaired balance/gait;Impaired mobility - - - -  Follow up Falls prevention discussed;Education provided;Falls evaluation completed - - - -   Functional Status Survey:    Vitals:   02/24/21 1528  BP: 122/63  Pulse: (!) 48  Resp: 18  Temp: 97.7 F (36.5 C)  SpO2: 94%  Weight: 182 lb 9.6 oz (82.8 kg)  Height: 5\' 9"  (1.753 m)   Body mass index is 26.97 kg/m. Physical  Exam Vitals reviewed.  Constitutional:      General: He is not in acute distress. HENT:     Head: Normocephalic.  Eyes:     General:        Right eye: No discharge.        Left eye: No discharge.  Neck:     Vascular: No carotid bruit.  Cardiovascular:     Rate and Rhythm: Bradycardia present. Rhythm irregular.     Pulses: Normal pulses.     Heart sounds: Murmur heard.  Pulmonary:     Effort: Pulmonary effort is normal. No respiratory distress.     Breath sounds: Normal breath sounds. No wheezing.  Abdominal:     General: Abdomen is flat. Bowel sounds are normal. There is no distension.     Palpations: Abdomen is soft.     Tenderness: There is no abdominal tenderness. There is no guarding.  Musculoskeletal:     Cervical back: Normal range of  motion.     Right lower leg: No edema.     Left lower leg: No edema.  Lymphadenopathy:     Cervical: No cervical adenopathy.  Skin:    General: Skin is warm and dry.     Capillary Refill: Capillary refill takes less than 2 seconds.  Neurological:     General: No focal deficit present.     Mental Status: He is alert and oriented to person, place, and time.     Motor: Weakness present.     Gait: Gait abnormal.     Comments: Wheelchair/lift  Psychiatric:        Mood and Affect: Mood normal.        Behavior: Behavior normal.    Labs reviewed: Recent Labs    02/18/21 2118 02/19/21 0245 02/20/21 0745 02/21/21 0132 02/22/21 1359 02/23/21 0402  NA 142 139   < > 139 139 142  K 3.8 3.3*   < > 3.6 3.7 3.9  CL 104 102   < > 106 105 107  CO2 27 26   < > 26 29 24   GLUCOSE 108* 97   < > 117* 116* 101*  BUN 51* 46*   < > 36* 33* 33*  CREATININE 2.20* 2.03*   < > 1.75* 1.76* 1.73*  CALCIUM 8.1* 8.2*   < > 8.0* 8.4* 8.5*  MG 2.1 1.8  --   --   --  2.1   < > = values in this interval not displayed.   Recent Labs    02/21/21 0132 02/22/21 1359 02/23/21 0402  AST 39 25 20  ALT 56* 46* 40  ALKPHOS 208* 174* 158*  BILITOT 4.4* 3.3*  2.9*  PROT 5.6* 5.7* 5.5*  ALBUMIN 2.3* 2.3* 2.3*   Recent Labs    09/13/20 0000 09/14/20 0000 02/19/21 0245 02/20/21 0745 02/22/21 1359  WBC 12.1   < > 5.2 6.0 7.2  NEUTROABS 10.50  --  4.0  --   --   HGB 12.8*   < > 11.5* 12.1* 11.4*  HCT 39*   < > 35.2* 37.2* 37.2*  MCV  --    < > 97.2 96.6 101.6*  PLT 146*   < > 146* 151 165   < > = values in this interval not displayed.   Lab Results  Component Value Date   TSH 2.59 10/09/2019   Lab Results  Component Value Date   HGBA1C 5.3 03/01/2016   Lab Results  Component Value Date   CHOL 119 03/01/2016   HDL 56 03/01/2016   LDLCALC 52 03/01/2016   TRIG 54 03/01/2016    Significant Diagnostic Results in last 30 days:  CT ABDOMEN PELVIS WO CONTRAST  Result Date: 02/19/2021 CLINICAL DATA:  Abdomen distension abnormal labs elevated bilirubin EXAM: CT ABDOMEN AND PELVIS WITHOUT CONTRAST TECHNIQUE: Multidetector CT imaging of the abdomen and pelvis was performed following the standard protocol without IV contrast. COMPARISON:  None. FINDINGS: Lower chest: Lung bases demonstrate trace left pleural effusion. Cardiomegaly with trace pericardial effusion. Mitral calcification. Coronary vascular calcification. Hepatobiliary: No focal hepatic abnormality. Status post cholecystectomy. Extrahepatic common bile duct up to 8 mm. Pancreas: Possible mild peripancreatic fat stranding. No ductal dilatation. Suspected 18 mm cyst at the pancreatic neck, series 3, image 30. Spleen: Normal in size without focal abnormality. Adrenals/Urinary Tract: Adrenal glands are normal. Kidneys slightly atrophic. No hydronephrosis. Punctate stones lower pole left kidney. Distended urinary bladder. Stomach/Bowel: Stomach nonenlarged. No dilated small bowel. Diverticular disease  of the colon without acute wall thickening. Vascular/Lymphatic: Advanced aortic atherosclerosis. No aneurysm. No suspicious nodes. Reproductive: Enlarged prostate with calcification Other: Mild  presacral edema and stranding. No significant pelvic effusion or ascites. Right lateral abdominal wall laxity with bulging of fat and bowel. Musculoskeletal: Degenerative changes. No acute or suspicious osseous abnormality IMPRESSION: 1. Status post cholecystectomy. No intra hepatic biliary dilatation. Slightly enlarged extrahepatic common bile duct, probably due to postsurgical change. Follow-up MRCP if ductal obstruction remains a concern. 2. Slightly indistinct appearance of pancreas, question mild pancreatitis, correlate with appropriate enzymes. Suspected 18 mm cyst at the pancreatic neck 3. Nonobstructing stones in the left kidney 4. Diverticular disease of the colon without acute inflammation Electronically Signed   By: Donavan Foil M.D.   On: 02/19/2021 00:05   DG Chest 1 View  Result Date: 02/19/2021 CLINICAL DATA:  Pneumonia EXAM: CHEST  1 VIEW COMPARISON:  CT abdomen and pelvis 02/18/2021, chest radiograph 10/23/2016 FINDINGS: Some residual patchy opacities are present in the right mid to lower lung. No visible layering effusion or pneumothorax. Pulmonary vascularity is normally distributed. Cardiomegaly is similar to prior with a calcified, tortuous aorta. Slight rightward mediastinal shift with tracheal tortuosity is also stable. Degenerative changes are present in the imaged spine and shoulders. Telemetry leads overlie the chest. IMPRESSION: 1. Some residual ill-defined opacities present in the right mid to lower lung may be atelectatic or related to infectious or inflammatory consolidation. 2. Enlarged cardiac silhouette may reflect a combination of cardiomegaly and pericardial effusion seen on comparison CT imaging. 3. Aortic Atherosclerosis (ICD10-I70.0). Electronically Signed   By: Lovena Le M.D.   On: 02/19/2021 02:36   DG ERCP  Result Date: 02/21/2021 CLINICAL DATA:  Choledocholithiasis post cholecystectomy EXAM: ERCP TECHNIQUE: Multiple spot images obtained with the fluoroscopic  device and submitted for interpretation post-procedure. COMPARISON:  MRCP 02/19/2021 FINDINGS: A series of fluoroscopic spot images document endoscopic cannulation and partial opacification of the CBD with withdrawal of balloon catheter through the length of the CBD. The intrahepatic ducts are incompletely opacified, peer Ng decompressed centrally. Cholecystectomy clips are noted. No extravasation. IMPRESSION: Endoscopic CBD cannulation and intervention as above. These images were submitted for radiologic interpretation only. Please see the procedural report for the amount of contrast and the fluoroscopy time utilized. Electronically Signed   By: Lucrezia Europe M.D.   On: 02/21/2021 11:32   MR ABDOMEN MRCP W WO CONTAST  Result Date: 02/19/2021 CLINICAL DATA:  Dilated LFTs EXAM: MRI ABDOMEN WITHOUT AND WITH CONTRAST (INCLUDING MRCP) TECHNIQUE: Multiplanar multisequence MR imaging of the abdomen was performed both before and after the administration of intravenous contrast. Heavily T2-weighted images of the biliary and pancreatic ducts were obtained, and three-dimensional MRCP images were rendered by post processing. CONTRAST:  41mL GADAVIST GADOBUTROL 1 MMOL/ML IV SOLN COMPARISON:  CT abdomen/pelvis dated 02/18/2021 FINDINGS: Lower chest: Scarring/atelectasis at the left lung base. Cardiomegaly. Hepatobiliary: Liver is within normal limits. No hepatic steatosis. No suspicious/enhancing hepatic lesions. Status post cholecystectomy. No intrahepatic ductal dilatation. Dilated common duct, measuring 10 mm (series 11/image 20), with at least four mid/distal CBD stones (series 11/images 18-19) measuring up to 9 mm (series 7/image 16). Two additional cystic duct remnant stones measuring up to 7 mm (series 7/image 23). Pancreas: Multiple nonenhancing pancreatic cystic lesions, including a dominant 17 mm cyst in the pancreatic body (series 7/image 22). No enhancement following contrast administration. No main pancreatic ductal  dilatation. Spleen:  Within normal limits. Adrenals/Urinary Tract:  Adrenal glands are within normal  limits. 5 mm hemorrhagic cyst in the left upper kidney (series 1300/image 32). 13 mm right lower pole renal cyst (series 7/image 27). No enhancing renal lesions. No hydronephrosis. Stomach/Bowel: Stomach is within normal limits. Visualized bowel is notable for left colonic diverticulosis, without evidence of diverticulitis. Vascular/Lymphatic:  No evidence of abdominal aortic aneurysm. No suspicious abdominal lymphadenopathy. Other:  No abdominal ascites. Musculoskeletal: No focal osseous lesions. IMPRESSION: Status post cholecystectomy. Mildly dilated common duct, measuring 10 mm. Choledocholithiasis with multiple mid/distal CBD stones measuring up to 9 mm and two cystic duct remnant stones measuring up to 7 mm, as above. Multiple nonenhancing pancreatic cysts, measuring up to 17 mm in the pancreatic body. These likely reflect benign pseudocysts or side branch IPMNs. Given the patient's age, routine follow-up is not recommended. Electronically Signed   By: Julian Hy M.D.   On: 02/19/2021 10:41    Assessment/Plan 1. S/P ERCP - ERCP 02/21/21, CBD stone removed - sphincterotomy done to prevent future obstruction - jaundice resolved, no nausea or abdominal pain - LFT in 3-4 days  2. Paroxysmal atrial fibrillation (HCC) - rate controlled without medication - cont eliquis for clot prevention  3. Chronic congestive heart failure, unspecified heart failure type (HCC) - no weight fluctuations, sob or ankle edema - lasix held due to hypotension - check blood pressure bid x 7 days- report results to provider  4. Stage 3b chronic kidney disease ([place]) -BUN creat 33/1.73 02/23/21 - continue to avoid nephrotoxic drugs like NSAIDS and dose adjust ,medications to be renally excreted  5. Sick sinus syndrome (HCC) - asymptomatic - does not wish to have pacemaker placed  6. Pressure injury of left  buttock, stage 2 ([place]) - ongoing, followed by wound care - cont Alleveyn foam dressing - encourage frequent position changes  7. Acquired hypothyroidism - TSH 2.59 10/2019 - stable with levothyroxine 50 mcg  - TSH- future  8. Weakness - due to advanced age, recent covid, and co morbidities - PT/OT consult  9. Chronic right shoulder pain - suspect arthritis  - cont tylenol 1000 mg po bid prn - Aspercreme - apply to right shoulder bid prn  10. Chronic pain of right knee -see above    Family/ staff Communication: plan discussed with patient and nurse  Labs/tests ordered: LFT in 06/24

## 2021-02-25 ENCOUNTER — Non-Acute Institutional Stay (SKILLED_NURSING_FACILITY): Payer: Medicare Other | Admitting: Adult Health

## 2021-02-25 ENCOUNTER — Encounter: Payer: Self-pay | Admitting: Adult Health

## 2021-02-25 DIAGNOSIS — Z9889 Other specified postprocedural states: Secondary | ICD-10-CM | POA: Diagnosis not present

## 2021-02-25 DIAGNOSIS — R234 Changes in skin texture: Secondary | ICD-10-CM

## 2021-02-25 DIAGNOSIS — N1832 Chronic kidney disease, stage 3b: Secondary | ICD-10-CM

## 2021-02-25 DIAGNOSIS — K8050 Calculus of bile duct without cholangitis or cholecystitis without obstruction: Secondary | ICD-10-CM

## 2021-02-25 DIAGNOSIS — R5383 Other fatigue: Secondary | ICD-10-CM | POA: Diagnosis not present

## 2021-02-25 DIAGNOSIS — L89152 Pressure ulcer of sacral region, stage 2: Secondary | ICD-10-CM

## 2021-02-25 DIAGNOSIS — R630 Anorexia: Secondary | ICD-10-CM

## 2021-02-25 DIAGNOSIS — E440 Moderate protein-calorie malnutrition: Secondary | ICD-10-CM | POA: Diagnosis not present

## 2021-02-25 NOTE — Progress Notes (Signed)
Location:  Occupational psychologist of Service:  SNF (31) Provider:   Cindi Carbon, [place] [phone]   Virgie Dad, MD  Patient Care Team: Virgie Dad, MD as PCP - General (Internal Medicine) [facility] [name], DMD as Consulting Physician (Oral Surgery)  Extended Emergency Contact Information Primary Emergency Contact: [facility] Address: [address]          [place], [place] [address] [name] of [place] Phone: [phone] Relation: Daughter Secondary Emergency Contact: Flaten,Herschel Address: [place], [place] [address] [name] of [place] Phone: [phone] Mobile Phone: [phone] Relation: Son  Code Status:  DNR  Goals of care: Advanced Directive information Advanced Directives 02/18/2021  Does Patient Have a Medical Advance Directive? No;Yes  Type of Advance Directive Out of facility DNR (pink MOST or yellow form)  Does patient want to make changes to medical advance directive? No - Patient declined  Copy of [place] in Chart? -  Would patient like information on creating a medical advance directive? No - Patient declined  Pre-existing out of facility DNR order (yellow form or pink MOST form) Pink Most/Yellow Form available - Physician notified to receive inpatient order     Chief Complaint  Patient presents with   Acute Visit    Decreased appetite and lethargy    HPI:  Pt is a 87 y.o. male seen today for an acute visit for decreased appetite and lethargy/confusion. He presented with jaundice and elevated bilirubin to the ED and was hospitalized 02/18/21-02/23/21.  Mr. Coburn underwent ERCP on 6/19 with stone removal and sphincterectomy. [facility] he was alert and eating but over the past two days per the nurse he is sleeping more, has less energy, and is eating less. His intakes are not accurately  recorded as [place]. He is having bowel movement and had  wet brief during my visit. He denies any abd pain, nausea, or fever. His main complaint is the his left heel hurts. He has an area of black eschar there and a hx of left heel pain likely due to PAD.  He also has a stage 2 pressure injury to his sacral/coccyx area. He states this is also painful. He has had a pressure injury there before due to immobility that took a long time to heal.  Also of note he had Covid and was treated with Paxlovid 5/29.     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   Hyperglycemia 01/02/2013   Hypertrophy of prostate without urinary obstruction and other lower urinary tract symptoms (LUTS) 2002   Insomnia with sleep apnea, unspecified 02/2010   Irritable bowel syndrome 11/14/2011   Long term (current) use of anticoagulants 11/2009   Mild cognitive impairment 04/19/2020   Other abnormal blood chemistry 02/09/2009   Other B-complex deficiencies 2008   Other dyspnea and respiratory abnormality 11/2009  Other malaise and fatigue 2007   Pain in joint, lower leg 02/2010   Palpitations 2007   Reflux esophagitis 2007   Sebaceous cyst 2008   Tension headache 2009   Undiagnosed cardiac murmurs 2002   Unspecified constipation 07/04/2012   Unspecified essential hypertension 2007   Unspecified hereditary and idiopathic peripheral neuropathy 02/2010   Unspecified transient cerebral ischemia 2002   Urinary frequency 10/01/2012   Past Surgical History:   Procedure Laterality Date   ENDOSCOPIC RETROGRADE CHOLANGIOPANCREATOGRAPHY (ERCP) WITH PROPOFOL N/A 02/21/2021   Procedure: ENDOSCOPIC RETROGRADE CHOLANGIOPANCREATOGRAPHY (ERCP) WITH PROPOFOL;  Surgeon: Irving Copas., MD;  Location: [facility];  Service: Gastroenterology;  Laterality: N/A;   REMOVAL OF STONES  02/21/2021   Procedure: REMOVAL OF STONES;  Surgeon: Rush Landmark Telford Nab., MD;  Location: [place] [facility];  Service: Gastroenterology;;   Joan Mayans  02/21/2021   Procedure: Joan Mayans;  Surgeon: Mansouraty, Telford Nab., MD;  Location: [facility] ENDOSCOPY;  Service: Gastroenterology;;    Allergies  Allergen Reactions   Other Other (See Comments)    Peppers. Unknown reaction   Plavix [Clopidogrel Bisulfate] Other (See Comments)    Indigestion, GI upset    Outpatient Encounter Medications as of 02/25/2021  Medication Sig   Acetaminophen 500 MG coapsule Take 2 capsules by mouth 2 (two) times daily as needed. And 2 by mouth as needed through out the day for pain   Amino Acids-Protein Hydrolys (FEEDING SUPPLEMENT, PRO-STAT 64,) LIQD Take 30 mLs by mouth daily with breakfast.   apixaban (ELIQUIS) 2.5 MG TABS tablet Take 1 tablet (2.5 mg total) by mouth 2 (two) times daily. For Afib and stroke prophylaxis   doxazosin (CARDURA) 2 MG tablet Take 1 tablet (2 mg total) by mouth daily.   Emollient (CERAVE DAILY MOISTURIZING) LOTN Apply topically. Apply to legs/feet before compression socks   Eyelid Cleansers (OCUSOFT EYELID CLEANSING) PADS Apply 1 application topically as directed.   fluticasone (FLONASE) 50 MCG/ACT nasal spray Place 2 sprays into both nostrils 2 (two) times daily.   gabapentin (NEURONTIN) 100 MG capsule Take 200 mg by mouth at bedtime.   Lactase 9000 units CHEW chew 1 tab (9,000 u) by mouth w/ first bite of milk product PRN lactose intolerant.   Levothyroxine Sodium 50 MCG CAPS Take 1 capsule (50 mcg total) by mouth daily before breakfast.   Melatonin 3 MG CAPS  Take 1 capsule (3 mg total) by mouth at bedtime.   mupirocin ointment (BACTROBAN) 2 % Apply 1 application topically as needed. Apply to left buttock skin tag, no more than 2 times a day until healed   omeprazole (PRILOSEC OTC) 20 MG tablet Take 20 mg by mouth daily.   ondansetron (ZOFRAN) 4 MG tablet Take 4 mg by mouth every 8 (eight) hours as needed for nausea or vomiting.   Polyvinyl Alcohol-Povidone PF 1.4-0.6 % SOLN Apply 1.4 % to eye 2 (two) times daily.    promethazine (PHENERGAN) 25 MG tablet Take 0.5 tablets (12.5 mg total) by mouth every 6 (six) hours as needed for nausea or vomiting (If vomiting persists after 2 doses, notify MD/NP.).   trolamine salicylate (ASPERCREME) 10 % cream Apply 1 application topically 2 (two) times daily as needed. Apply to right shoulder and knees   vitamin B-12 (CYANOCOBALAMIN) 1000 MCG tablet Take 1,000 mcg by mouth daily.   No facility-administered encounter medications on file as of 02/25/2021.    Review of Systems  Constitutional:  Positive for activity change, appetite change and fatigue. Negative for chills, diaphoresis and  fever.       Weight loss   HENT:  Negative for congestion.   Respiratory:  Negative for cough, shortness of breath, wheezing and stridor.   Cardiovascular:  Negative for chest pain, palpitations and leg swelling.  Gastrointestinal:  Negative for abdominal distention, abdominal pain, constipation, diarrhea, nausea, rectal pain and vomiting.  Genitourinary:  Negative for difficulty urinating and dysuria.  Musculoskeletal:  Positive for back pain and gait problem. Negative for arthralgias, joint swelling and myalgias.  Skin:  Positive for wound.  Neurological:  Negative for dizziness, seizures, syncope, facial asymmetry, speech difficulty, weakness (general) and headaches.  Hematological:  Negative for adenopathy. Does not bruise/bleed easily.  Psychiatric/Behavioral:  Positive for confusion. Negative for agitation and behavioral  problems.    Immunization History  Administered Date(s) Administered   Influenza Inj Mdck Quad Pf 06/23/2016   Influenza, High Dose Seasonal PF 07/04/2019, 06/26/2020, 07/03/2020   Influenza,inj,Quad PF,6+ Mos 07/05/2018   Influenza-Unspecified 06/11/2013, 06/23/2014, 06/25/2015, 06/29/2017, 06/26/2020   Moderna SARS-COV2 Booster Vaccination 07/18/2020   Moderna Sars-Covid-2 Vaccination 09/17/2019, 10/30/2019   Pneumococcal Conjugate-13 09/09/2015   Pneumococcal Polysaccharide-23 09/05/2008   Td 09/05/1978   Tdap 02/23/2017   Zoster Recombinat (Shingrix) 10/02/2017, 03/01/2018   Pertinent  Health Maintenance Due  Topic Date Due   INFLUENZA VACCINE  04/05/2021   PNA vac Low Risk Adult  Completed   Fall Risk  04/19/2020 04/15/2020 04/15/2020 12/11/2019 07/31/2019  Falls in the past year? - 0 0 0 0  Comment - - - - Emmi Telephone Survey: data to providers prior to load  Number falls in past yr: - 0 0 0 -  Injury with Fall? - 0 0 0 -  Risk for fall due to : History of fall(s);Impaired balance/gait;Impaired mobility - - - -  Follow up Falls prevention discussed;Education provided;Falls evaluation completed - - - -   Functional Status Survey:    Vitals:   02/25/21 1505  BP: (!) 124/59  Pulse: (!) 50  Resp: 17  Temp: 97.7 F (36.5 C)  SpO2: 95%  Weight: 181 lb 3.2 oz (82.2 kg)   Body mass index is 26.76 kg/m. Physical Exam Vitals and nursing note reviewed.  Constitutional:      General: He is not in acute distress.    Appearance: He is not diaphoretic.  HENT:     Head: Normocephalic and atraumatic.     Nose: Nose normal. No congestion.     Mouth/Throat:     Mouth: Mucous membranes are dry.     Pharynx: Oropharynx is clear.  Neck:     Thyroid: No thyromegaly.     Vascular: No JVD.     Trachea: No tracheal deviation.  Cardiovascular:     Rate and Rhythm: Bradycardia present. Rhythm irregular.     Heart sounds: No murmur heard. Pulmonary:     Effort: Pulmonary effort  is normal. No respiratory distress.     Breath sounds: Normal breath sounds. No wheezing.  Abdominal:     General: Bowel sounds are normal. There is no distension.     Palpations: Abdomen is soft.     Tenderness: There is no abdominal tenderness.     Hernia: A hernia is present.  Musculoskeletal:     Cervical back: No rigidity or tenderness.     Right lower leg: No edema.     Left lower leg: No edema.  Lymphadenopathy:     Cervical: No cervical adenopathy.  Skin:    General: Skin is warm  and dry.     Comments: Left heel with black eschar and surrounding erythema. Tenderness to touch Stage 2 coccyx/sacral wound with 50% pink and 50% eschar tissue. Surrounding erythema. Clear and bloody drainage  Neurological:     General: No focal deficit present.     Mental Status: He is alert.     Cranial Nerves: No cranial nerve deficit.     Comments: Generally weak. BUE 4/5 BLE 3/5  Alert when I first walked in but fell asleep while I was talking to him Able to f/c.  Oriented to self, place, and situation, not time.   Psychiatric:        Mood and Affect: Mood normal.    Labs reviewed: Recent Labs    02/18/21 2118 02/19/21 0245 02/20/21 0745 02/21/21 0132 02/22/21 1359 02/23/21 0402  NA 142 139   < > 139 139 142  K 3.8 3.3*   < > 3.6 3.7 3.9  CL 104 102   < > 106 105 107  CO2 27 26   < > 26 29 24   GLUCOSE 108* 97   < > 117* 116* 101*  BUN 51* 46*   < > 36* 33* 33*  CREATININE 2.20* 2.03*   < > 1.75* 1.76* 1.73*  CALCIUM 8.1* 8.2*   < > 8.0* 8.4* 8.5*  MG 2.1 1.8  --   --   --  2.1   < > = values in this interval not displayed.   Recent Labs    02/21/21 0132 02/22/21 1359 02/23/21 0402  AST 39 25 20  ALT 56* 46* 40  ALKPHOS 208* 174* 158*  BILITOT 4.4* 3.3* 2.9*  PROT 5.6* 5.7* 5.5*  ALBUMIN 2.3* 2.3* 2.3*   Recent Labs    09/13/20 0000 09/14/20 0000 02/19/21 0245 02/20/21 0745 02/22/21 1359  WBC 12.1   < > 5.2 6.0 7.2  NEUTROABS 10.50  --  4.0  --   --   HGB  12.8*   < > 11.5* 12.1* 11.4*  HCT 39*   < > 35.2* 37.2* 37.2*  MCV  --    < > 97.2 96.6 101.6*  PLT 146*   < > 146* 151 165   < > = values in this interval not displayed.   Lab Results  Component Value Date   TSH 2.59 10/09/2019   Lab Results  Component Value Date   HGBA1C 5.3 03/01/2016   Lab Results  Component Value Date   CHOL 119 03/01/2016   HDL 56 03/01/2016   LDLCALC 52 03/01/2016   TRIG 54 03/01/2016    Significant Diagnostic Results in last 30 days:  CT ABDOMEN PELVIS WO CONTRAST  Result Date: 02/19/2021 CLINICAL DATA:  Abdomen distension abnormal labs elevated bilirubin EXAM: CT ABDOMEN AND PELVIS WITHOUT CONTRAST TECHNIQUE: Multidetector CT imaging of the abdomen and pelvis was performed following the standard protocol without IV contrast. COMPARISON:  None. FINDINGS: Lower chest: Lung bases demonstrate trace left pleural effusion. Cardiomegaly with trace pericardial effusion. Mitral calcification. Coronary vascular calcification. Hepatobiliary: No focal hepatic abnormality. Status post cholecystectomy. Extrahepatic common bile duct up to 8 mm. Pancreas: Possible mild peripancreatic fat stranding. No ductal dilatation. Suspected 18 mm cyst at the pancreatic neck, series 3, image 30. Spleen: Normal in size without focal abnormality. Adrenals/Urinary Tract: Adrenal glands are normal. Kidneys slightly atrophic. No hydronephrosis. Punctate stones lower pole left kidney. Distended urinary bladder. Stomach/Bowel: Stomach nonenlarged. No dilated small bowel. Diverticular disease of the colon without acute  wall thickening. Vascular/Lymphatic: Advanced aortic atherosclerosis. No aneurysm. No suspicious nodes. Reproductive: Enlarged prostate with calcification Other: Mild presacral edema and stranding. No significant pelvic effusion or ascites. Right lateral abdominal wall laxity with bulging of fat and bowel. Musculoskeletal: Degenerative changes. No acute or suspicious osseous  abnormality IMPRESSION: 1. Status post cholecystectomy. No intra hepatic biliary dilatation. Slightly enlarged extrahepatic common bile duct, probably due to postsurgical change. Follow-up MRCP if ductal obstruction remains a concern. 2. Slightly indistinct appearance of pancreas, question mild pancreatitis, correlate with appropriate enzymes. Suspected 18 mm cyst at the pancreatic neck 3. Nonobstructing stones in the left kidney 4. Diverticular disease of the colon without acute inflammation Electronically Signed   By: Donavan Foil M.D.   On: 02/19/2021 00:05   DG Chest 1 View  Result Date: 02/19/2021 CLINICAL DATA:  Pneumonia EXAM: CHEST  1 VIEW COMPARISON:  CT abdomen and pelvis 02/18/2021, chest radiograph 10/23/2016 FINDINGS: Some residual patchy opacities are present in the right mid to lower lung. No visible layering effusion or pneumothorax. Pulmonary vascularity is normally distributed. Cardiomegaly is similar to prior with a calcified, tortuous aorta. Slight rightward mediastinal shift with tracheal tortuosity is also stable. Degenerative changes are present in the imaged spine and shoulders. Telemetry leads overlie the chest. IMPRESSION: 1. Some residual ill-defined opacities present in the right mid to lower lung may be atelectatic or related to infectious or inflammatory consolidation. 2. Enlarged cardiac silhouette may reflect a combination of cardiomegaly and pericardial effusion seen on comparison CT imaging. 3. Aortic Atherosclerosis (ICD10-I70.0). Electronically Signed   By: Lovena Le M.D.   On: 02/19/2021 02:36   DG ERCP  Result Date: 02/21/2021 CLINICAL DATA:  Choledocholithiasis post cholecystectomy EXAM: ERCP TECHNIQUE: Multiple spot images obtained with the fluoroscopic device and submitted for interpretation post-procedure. COMPARISON:  MRCP 02/19/2021 FINDINGS: A series of fluoroscopic spot images document endoscopic cannulation and partial opacification of the CBD with  withdrawal of balloon catheter through the length of the CBD. The intrahepatic ducts are incompletely opacified, peer Ng decompressed centrally. Cholecystectomy clips are noted. No extravasation. IMPRESSION: Endoscopic CBD cannulation and intervention as above. These images were submitted for radiologic interpretation only. Please see the procedural report for the amount of contrast and the fluoroscopy time utilized. Electronically Signed   By: Lucrezia Europe M.D.   On: 02/21/2021 11:32   MR ABDOMEN MRCP W WO CONTAST  Result Date: 02/19/2021 CLINICAL DATA:  Dilated LFTs EXAM: MRI ABDOMEN WITHOUT AND WITH CONTRAST (INCLUDING MRCP) TECHNIQUE: Multiplanar multisequence MR imaging of the abdomen was performed both before and after the administration of intravenous contrast. Heavily T2-weighted images of the biliary and pancreatic ducts were obtained, and three-dimensional MRCP images were rendered by post processing. CONTRAST:  29mL GADAVIST GADOBUTROL 1 MMOL/ML IV SOLN COMPARISON:  CT abdomen/pelvis dated 02/18/2021 FINDINGS: Lower chest: Scarring/atelectasis at the left lung base. Cardiomegaly. Hepatobiliary: Liver is within normal limits. No hepatic steatosis. No suspicious/enhancing hepatic lesions. Status post cholecystectomy. No intrahepatic ductal dilatation. Dilated common duct, measuring 10 mm (series 11/image 20), with at least four mid/distal CBD stones (series 11/images 18-19) measuring up to 9 mm (series 7/image 16). Two additional cystic duct remnant stones measuring up to 7 mm (series 7/image 23). Pancreas: Multiple nonenhancing pancreatic cystic lesions, including a dominant 17 mm cyst in the pancreatic body (series 7/image 22). No enhancement following contrast administration. No main pancreatic ductal dilatation. Spleen:  Within normal limits. Adrenals/Urinary Tract:  Adrenal glands are within normal limits. 5 mm hemorrhagic cyst in  the left upper kidney (series 1300/image 32). 13 mm right lower pole  renal cyst (series 7/image 27). No enhancing renal lesions. No hydronephrosis. Stomach/Bowel: Stomach is within normal limits. Visualized bowel is notable for left colonic diverticulosis, without evidence of diverticulitis. Vascular/Lymphatic:  No evidence of abdominal aortic aneurysm. No suspicious abdominal lymphadenopathy. Other:  No abdominal ascites. Musculoskeletal: No focal osseous lesions. IMPRESSION: Status post cholecystectomy. Mildly dilated common duct, measuring 10 mm. Choledocholithiasis with multiple mid/distal CBD stones measuring up to 9 mm and two cystic duct remnant stones measuring up to 7 mm, as above. Multiple nonenhancing pancreatic cysts, measuring up to 17 mm in the pancreatic body. These likely reflect benign pseudocysts or side branch IPMNs. Given the patient's age, routine follow-up is not recommended. Electronically Signed   By: Julian Hy M.D.   On: 02/19/2021 10:41    Assessment/Plan  1. Lethargy Awake during the visit and able to f/c but is falling asleep easily. May not be tolerating the neurontin at 300 mg, will reduce back to 200 mg qhs Check labs also  Pt refuses therapy  2. Decreased appetite Not having any abd pain or nausea, will check LFTs  3. Choledocholithiasis Led to #4  4. S/P ERCP F/u LFT due in the am  5. Pressure injury of coccygeal region, stage 2 ([place]) Continue polymem dressing changes per [facility] staff Wound is improved in terms of eshcar tissue when compared with his first picture on hospital discharge day 1.  Will add santyl Add air mattress  6. Eschar of heel Ordered heel boot and air mattress  7. Chronic kidney disease, stage 3b (HCC) Off lasix Check BMP in am   8. Moderate protein-calorie malnutrition ([place]) Low albumin Continue prostat for wound healing.  Followed by nutrition    If he does not improve would consider hospice. Will discuss with his family when lab results tomorrow.  Labs/tests ordered:  CBC BMP LFTs

## 2021-02-26 DIAGNOSIS — R7989 Other specified abnormal findings of blood chemistry: Secondary | ICD-10-CM | POA: Diagnosis not present

## 2021-02-26 LAB — CBC: RBC: 3.59 — AB (ref 3.87–5.11)

## 2021-02-26 LAB — COMPREHENSIVE METABOLIC PANEL
Albumin: 2.8 — AB (ref 3.5–5.0)
Calcium: 8.6 — AB (ref 8.7–10.7)

## 2021-02-26 LAB — BASIC METABOLIC PANEL
BUN: 34 — AB (ref 4–21)
CO2: 27 — AB (ref 13–22)
Chloride: 107 (ref 99–108)
Creatinine: 1.5 — AB (ref 0.6–1.3)
Glucose: 92
Potassium: 3.8 (ref 3.4–5.3)
Sodium: 144 (ref 137–147)

## 2021-02-26 LAB — HEPATIC FUNCTION PANEL
ALT: 27 (ref 10–40)
AST: 18 (ref 14–40)
Alkaline Phosphatase: 207 — AB (ref 25–125)
Bilirubin, Direct: 1.46 — AB (ref 0.01–0.4)
Bilirubin, Total: 2.4

## 2021-02-26 LAB — CBC AND DIFFERENTIAL
HCT: 35 — AB (ref 41–53)
Hemoglobin: 11.4 — AB (ref 13.5–17.5)
Platelets: 195 (ref 150–399)
WBC: 4.6

## 2021-03-01 ENCOUNTER — Encounter: Payer: Self-pay | Admitting: Internal Medicine

## 2021-03-01 ENCOUNTER — Non-Acute Institutional Stay (SKILLED_NURSING_FACILITY): Payer: Medicare Other | Admitting: Internal Medicine

## 2021-03-01 DIAGNOSIS — N1832 Chronic kidney disease, stage 3b: Secondary | ICD-10-CM | POA: Diagnosis not present

## 2021-03-01 DIAGNOSIS — R234 Changes in skin texture: Secondary | ICD-10-CM

## 2021-03-01 DIAGNOSIS — I509 Heart failure, unspecified: Secondary | ICD-10-CM

## 2021-03-01 DIAGNOSIS — R400 Somnolence: Secondary | ICD-10-CM

## 2021-03-01 DIAGNOSIS — Z9889 Other specified postprocedural states: Secondary | ICD-10-CM

## 2021-03-01 DIAGNOSIS — L89152 Pressure ulcer of sacral region, stage 2: Secondary | ICD-10-CM | POA: Diagnosis not present

## 2021-03-01 DIAGNOSIS — I480 Paroxysmal atrial fibrillation: Secondary | ICD-10-CM

## 2021-03-01 NOTE — Progress Notes (Signed)
Provider:  Veleta Miners MD  Location:   [facility] Room Number: 462 Place of Service:  SNF ([phone])  PCP: Virgie Dad, MD Patient Care Team: Virgie Dad, MD as PCP - General (Internal Medicine) [facility], [facility] [name], DMD as Consulting Physician (Oral Surgery)  Extended Emergency Contact Information Primary Emergency Contact: [facility] Address: [address]          [place], [place] [address] [name] of [place] Phone: [phone] Relation: Daughter Secondary Emergency Contact: Sansoucie,Kaspar Address: [place], [place] [address] [name] of [place] Phone: [phone] Mobile Phone: [phone] Relation: Son  Code Status: DNR Goals of Care: Advanced Directive information Advanced Directives 03/01/2021  Does Patient Have a Medical Advance Directive? Yes  Type of Paramedic of [place];Living will;Out of facility DNR (pink MOST or yellow form)  Does patient want to make changes to medical advance directive? No - Patient declined  Copy of [place] in Chart? Yes - validated most recent copy scanned in chart (See row information)  Would patient like information on creating a medical advance directive? -  Pre-existing out of facility DNR order (yellow form or pink MOST form) Yellow form placed in chart (order not valid for inpatient use)      Chief Complaint  Patient presents with   Readmit To SNF    Readmission to SNF    HPI: Patient is a 87 y.o. male seen today for admission to SNF for [place]  Patient was admitted in the hospital 6/16-6/21 for: Choledocholithiasis s/p ERCP  Patient is long term resident of SNF Has history of sick sinus syndrome.  Bradycardia History of PAF On Eliquis Chronic CHF CKD stage III, BPH Mild cognitive impairment, hypothyroid  Was send to ED for Elevated LFTS with Nausea and  Vomiting Was fond to have Choledocholithiasis Underwent ERCP on 06/19 Discharged back to SNF Patient has been Declining since being here Having Episodes of Lethargy No  Fever or pain or Nausea Neurontin was cut back recently This morning when I went ot see him he was very sleepy would not open his eyes But then later in day he was more awake. 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   Hyperglycemia 01/02/2013   Hypertrophy of prostate without urinary obstruction and other lower urinary tract symptoms (LUTS) 2002   Insomnia with sleep apnea, unspecified 02/2010   Irritable bowel syndrome 11/14/2011   Long term (current) use of anticoagulants 11/2009   Mild cognitive impairment 04/19/2020   Other abnormal blood chemistry 02/09/2009   Other B-complex deficiencies 2008   Other dyspnea and respiratory abnormality 11/2009   Other malaise and fatigue 2007   Pain in joint, lower leg 02/2010   Palpitations 2007   Reflux esophagitis 2007   Sebaceous cyst 2008   Tension headache 2009   Undiagnosed cardiac murmurs 2002  Unspecified constipation 07/04/2012   Unspecified essential hypertension 2007   Unspecified hereditary and idiopathic peripheral neuropathy 02/2010   Unspecified transient cerebral ischemia 2002   Urinary frequency 10/01/2012   Past Surgical History:  Procedure Laterality Date   ENDOSCOPIC RETROGRADE CHOLANGIOPANCREATOGRAPHY (ERCP) WITH PROPOFOL N/A 02/21/2021   Procedure: ENDOSCOPIC  RETROGRADE CHOLANGIOPANCREATOGRAPHY (ERCP) WITH PROPOFOL;  Surgeon: Irving Copas., MD;  Location: [facility];  Service: Gastroenterology;  Laterality: N/A;   REMOVAL OF STONES  02/21/2021   Procedure: REMOVAL OF STONES;  Surgeon: Rush Landmark Telford Nab., MD;  Location: [facility];  Service: Gastroenterology;;   Joan Mayans  02/21/2021   Procedure: Joan Mayans;  Surgeon: Mansouraty, Telford Nab., MD;  Location: [facility];  Service: Gastroenterology;;    reports that he has never smoked. He has never used smokeless tobacco. He reports current alcohol use. He reports that he does not use drugs. Social History   Socioeconomic History   Marital status: Widowed    Spouse name: Not on file   Number of children: Not on file   Years of education: Not on file   Highest education level: Not on file  Occupational History   Occupation: retired   Tobacco Use   Smoking status: Never   Smokeless tobacco: Never  Vaping Use   Vaping Use: Never used  Substance and Sexual Activity   Alcohol use: Yes    Comment: 2oz a week   Drug use: No   Sexual activity: Never  Other Topics Concern   Not on file  Social History Narrative   Lives at [facility] since 02/1992   Widowed   Living Will, DNR   Retired Futures trader with walking, power wheelchair   Exercise: none   Social Determinants of Radio broadcast assistant Strain: Not on file  Food Insecurity: Not on file  Transportation Needs: Not on file  Physical Activity: Not on file  Stress: Not on file  Social Connections: Not on file  Intimate Partner Violence: Not on file    Functional Status Survey:    Family History  Problem Relation Age of Onset   Congestive Heart Failure Mother    Diabetes Father    Heart disease Father    Lung cancer Sister    Kidney disease Sister     Health Maintenance  Topic Date Due   COVID-19 Vaccine (4 - Booster for Moderna series) 10/18/2020   INFLUENZA VACCINE  04/05/2021    TETANUS/TDAP  02/24/2027   PNA vac Low Risk Adult  Completed   Zoster Vaccines- Shingrix  Completed   HPV VACCINES  Aged Out    Allergies  Allergen Reactions   Other Other (See Comments)    Peppers. Unknown reaction   Plavix [Clopidogrel Bisulfate] Other (See Comments)    Indigestion, GI upset    Allergies as of 03/01/2021       Reactions   Other Other (See Comments)   Peppers. Unknown reaction   Plavix [clopidogrel Bisulfate] Other (See Comments)   Indigestion, GI upset        Medication List        Accurate as of March 01, 2021  9:48 AM. If you have any questions, ask your nurse or doctor.          Acetaminophen 500 MG capsule Take 2 capsules by mouth 2 (two) times daily as needed.   acetaminophen 500 MG tablet Commonly known as: TYLENOL Take 500 mg by mouth at bedtime.   apixaban 2.5 MG  Tabs tablet Commonly known as: Eliquis Take 1 tablet (2.5 mg total) by mouth 2 (two) times daily. For Afib and stroke prophylaxis   CeraVe Daily Moisturizing Lotn Apply topically. Apply to legs/feet before compression socks   doxazosin 2 MG tablet Commonly known as: Cardura Take 1 tablet (2 mg total) by mouth daily.   feeding supplement (PRO-STAT 64) Liqd Take 30 mLs by mouth daily with breakfast.   fluticasone 50 MCG/ACT nasal spray Commonly known as: FLONASE Place 2 sprays into both nostrils 2 (two) times daily.   gabapentin 100 MG capsule Commonly known as: NEURONTIN Take 200 mg by mouth at bedtime.   Lactase 9000 units Chew chew 1 tab (9,000 u) by mouth w/ first bite of milk product PRN lactose intolerant.   Levothyroxine Sodium 50 MCG Caps Take 1 capsule (50 mcg total) by mouth daily before breakfast.   Melatonin 3 MG Caps Take 1 capsule (3 mg total) by mouth at bedtime.   mupirocin ointment 2 % Commonly known as: BACTROBAN Apply 1 application topically as needed. Apply to left buttock skin tag, no more than 2 times a day until healed   OcuSoft Eyelid  Cleansing Pads Apply 1 application topically as directed.   omeprazole 20 MG tablet Commonly known as: PRILOSEC OTC Take 20 mg by mouth daily.   ondansetron 4 MG tablet Commonly known as: ZOFRAN Take 4 mg by mouth every 8 (eight) hours as needed for nausea or vomiting.   Polyvinyl Alcohol-Povidone PF 1.4-0.6 % Soln Apply 1.4 % to eye 2 (two) times daily.   trolamine salicylate 10 % cream Commonly known as: ASPERCREME Apply 1 application topically 2 (two) times daily as needed. Apply to right shoulder and knees   vitamin B-12 1000 MCG tablet Commonly known as: CYANOCOBALAMIN Take 1,000 mcg by mouth daily.        Review of Systems  Unable to perform ROS: Mental status change  Constitutional:  Positive for activity change.  HENT: Negative.    Respiratory: Negative.    Cardiovascular:  Positive for leg swelling.  Gastrointestinal: Negative.   Genitourinary: Negative.   Musculoskeletal:  Positive for arthralgias, gait problem and myalgias.  Skin:  Positive for wound.  Neurological:  Positive for weakness.  Psychiatric/Behavioral: Negative.     Vitals:   03/01/21 0933  BP: 136/66  Pulse: (!) 50  Resp: 14  Temp: 97.9 F (36.6 C)  SpO2: 94%  Weight: 183 lb 3.2 oz (83.1 kg)  Height: 5\' 9"  (1.753 m)   Body mass index is 27.05 kg/m. Physical Exam Vitals reviewed.  Constitutional:      Comments: Sleepy/Lethargy  HENT:     Head: Normocephalic.     Nose: Nose normal.     Mouth/Throat:     Mouth: Mucous membranes are moist.     Pharynx: Oropharynx is clear.  Eyes:     Pupils: Pupils are equal, round, and reactive to light.  Cardiovascular:     Rate and Rhythm: Regular rhythm. Bradycardia present.     Pulses: Normal pulses.     Heart sounds: No murmur heard. Pulmonary:     Effort: Pulmonary effort is normal. No respiratory distress.     Breath sounds: Normal breath sounds. No rales.  Abdominal:     General: Abdomen is flat. Bowel sounds are normal.      Palpations: Abdomen is soft.  Musculoskeletal:     Cervical back: Neck supple.     Comments: Mild Swelling Right more then Left  Skin:    General: Skin is warm and dry.  Neurological:     Comments: Unable to do Detail Exam. Will open his eyes but then go back to sleep  Psychiatric:        Mood and Affect: Mood normal.        Thought Content: Thought content normal.    Labs reviewed: Basic Metabolic Panel: Recent Labs    02/18/21 2118 02/19/21 0245 02/20/21 0745 02/21/21 0132 02/22/21 1359 02/23/21 0402 02/26/21 0000  NA 142 139   < > 139 139 142 144  K 3.8 3.3*   < > 3.6 3.7 3.9 3.8  CL 104 102   < > 106 105 107 107  CO2 27 26   < > 26 29 24  27*  GLUCOSE 108* 97   < > 117* 116* 101*  --   BUN 51* 46*   < > 36* 33* 33* 34*  CREATININE 2.20* 2.03*   < > 1.75* 1.76* 1.73* 1.5*  CALCIUM 8.1* 8.2*   < > 8.0* 8.4* 8.5* 8.6*  MG 2.1 1.8  --   --   --  2.1  --    < > = values in this interval not displayed.   Liver Function Tests: Recent Labs    02/21/21 0132 02/22/21 1359 02/23/21 0402 02/26/21 0000  AST 39 25 20 18   ALT 56* 46* 40 27  ALKPHOS 208* 174* 158* 207*  BILITOT 4.4* 3.3* 2.9*  --   PROT 5.6* 5.7* 5.5*  --   ALBUMIN 2.3* 2.3* 2.3* 2.8*   Recent Labs    02/18/21 2118  LIPASE 56*   Recent Labs    02/19/21 0139  AMMONIA 23   CBC: Recent Labs    09/13/20 0000 09/14/20 0000 02/19/21 0245 02/20/21 0745 02/22/21 1359 02/26/21 0000  WBC 12.1   < > 5.2 6.0 7.2 4.6  NEUTROABS 10.50  --  4.0  --   --   --   HGB 12.8*   < > 11.5* 12.1* 11.4* 11.4*  HCT 39*   < > 35.2* 37.2* 37.2* 35*  MCV  --    < > 97.2 96.6 101.6*  --   PLT 146*   < > 146* 151 165 195   < > = values in this interval not displayed.   Cardiac Enzymes: No results for input(s): CKTOTAL, CKMB, CKMBINDEX, TROPONINI in the last 8760 hours. BNP: Invalid input(s): POCBNP Lab Results  Component Value Date   HGBA1C 5.3 03/01/2016   Lab Results  Component Value Date   TSH 2.59  10/09/2019   Lab Results  Component Value Date   VITAMINB12 1,095 03/20/2017   No results found for: FOLATE No results found for: IRON, TIBC, FERRITIN  Imaging and Procedures obtained prior to SNF admission: CT ABDOMEN PELVIS WO CONTRAST  Result Date: 02/19/2021 CLINICAL DATA:  Abdomen distension abnormal labs elevated bilirubin EXAM: CT ABDOMEN AND PELVIS WITHOUT CONTRAST TECHNIQUE: Multidetector CT imaging of the abdomen and pelvis was performed following the standard protocol without IV contrast. COMPARISON:  None. FINDINGS: Lower chest: Lung bases demonstrate trace left pleural effusion. Cardiomegaly with trace pericardial effusion. Mitral calcification. Coronary vascular calcification. Hepatobiliary: No focal hepatic abnormality. Status post cholecystectomy. Extrahepatic common bile duct up to 8 mm. Pancreas: Possible mild peripancreatic fat stranding. No ductal dilatation. Suspected 18 mm cyst at the pancreatic neck, series 3, image 30. Spleen: Normal in size without focal abnormality. Adrenals/Urinary Tract: Adrenal glands are normal. Kidneys slightly atrophic.  No hydronephrosis. Punctate stones lower pole left kidney. Distended urinary bladder. Stomach/Bowel: Stomach nonenlarged. No dilated small bowel. Diverticular disease of the colon without acute wall thickening. Vascular/Lymphatic: Advanced aortic atherosclerosis. No aneurysm. No suspicious nodes. Reproductive: Enlarged prostate with calcification Other: Mild presacral edema and stranding. No significant pelvic effusion or ascites. Right lateral abdominal wall laxity with bulging of fat and bowel. Musculoskeletal: Degenerative changes. No acute or suspicious osseous abnormality IMPRESSION: 1. Status post cholecystectomy. No intra hepatic biliary dilatation. Slightly enlarged extrahepatic common bile duct, probably due to postsurgical change. Follow-up MRCP if ductal obstruction remains a concern. 2. Slightly indistinct appearance of  pancreas, question mild pancreatitis, correlate with appropriate enzymes. Suspected 18 mm cyst at the pancreatic neck 3. Nonobstructing stones in the left kidney 4. Diverticular disease of the colon without acute inflammation Electronically Signed   By: Donavan Foil M.D.   On: 02/19/2021 00:05   DG Chest 1 View  Result Date: 02/19/2021 CLINICAL DATA:  Pneumonia EXAM: CHEST  1 VIEW COMPARISON:  CT abdomen and pelvis 02/18/2021, chest radiograph 10/23/2016 FINDINGS: Some residual patchy opacities are present in the right mid to lower lung. No visible layering effusion or pneumothorax. Pulmonary vascularity is normally distributed. Cardiomegaly is similar to prior with a calcified, tortuous aorta. Slight rightward mediastinal shift with tracheal tortuosity is also stable. Degenerative changes are present in the imaged spine and shoulders. Telemetry leads overlie the chest. IMPRESSION: 1. Some residual ill-defined opacities present in the right mid to lower lung may be atelectatic or related to infectious or inflammatory consolidation. 2. Enlarged cardiac silhouette may reflect a combination of cardiomegaly and pericardial effusion seen on comparison CT imaging. 3. Aortic Atherosclerosis (ICD10-I70.0). Electronically Signed   By: Lovena Le M.D.   On: 02/19/2021 02:36   MR ABDOMEN MRCP W WO CONTAST  Result Date: 02/19/2021 CLINICAL DATA:  Dilated LFTs EXAM: MRI ABDOMEN WITHOUT AND WITH CONTRAST (INCLUDING MRCP) TECHNIQUE: Multiplanar multisequence MR imaging of the abdomen was performed both before and after the administration of intravenous contrast. Heavily T2-weighted images of the biliary and pancreatic ducts were obtained, and three-dimensional MRCP images were rendered by post processing. CONTRAST:  78mL GADAVIST GADOBUTROL 1 MMOL/ML IV SOLN COMPARISON:  CT abdomen/pelvis dated 02/18/2021 FINDINGS: Lower chest: Scarring/atelectasis at the left lung base. Cardiomegaly. Hepatobiliary: Liver is within  normal limits. No hepatic steatosis. No suspicious/enhancing hepatic lesions. Status post cholecystectomy. No intrahepatic ductal dilatation. Dilated common duct, measuring 10 mm (series 11/image 20), with at least four mid/distal CBD stones (series 11/images 18-19) measuring up to 9 mm (series 7/image 16). Two additional cystic duct remnant stones measuring up to 7 mm (series 7/image 23). Pancreas: Multiple nonenhancing pancreatic cystic lesions, including a dominant 17 mm cyst in the pancreatic body (series 7/image 22). No enhancement following contrast administration. No main pancreatic ductal dilatation. Spleen:  Within normal limits. Adrenals/Urinary Tract:  Adrenal glands are within normal limits. 5 mm hemorrhagic cyst in the left upper kidney (series 1300/image 32). 13 mm right lower pole renal cyst (series 7/image 27). No enhancing renal lesions. No hydronephrosis. Stomach/Bowel: Stomach is within normal limits. Visualized bowel is notable for left colonic diverticulosis, without evidence of diverticulitis. Vascular/Lymphatic:  No evidence of abdominal aortic aneurysm. No suspicious abdominal lymphadenopathy. Other:  No abdominal ascites. Musculoskeletal: No focal osseous lesions. IMPRESSION: Status post cholecystectomy. Mildly dilated common duct, measuring 10 mm. Choledocholithiasis with multiple mid/distal CBD stones measuring up to 9 mm and two cystic duct remnant stones measuring up to 7 mm, as  above. Multiple nonenhancing pancreatic cysts, measuring up to 17 mm in the pancreatic body. These likely reflect benign pseudocysts or side branch IPMNs. Given the patient's age, routine follow-up is not recommended. Electronically Signed   By: Julian Hy M.D.   On: 02/19/2021 10:41    Assessment/Plan  Somnolent Patient was at his baseline latter in the day Will decrease his Neurontin to 100 mg QPM Give around 8 pm To see if it helps his morning Lethargy Also Check Labs in AM to rule out any  infection Cannot discontinue Neurontin as having pain in his left Leg due to DTI  S/P ERCP LFTS coming Down Appetite is good LE edema  Want to control it for healing of his DTI  Will restart low dose of Lasix 20 mg with Potassium Repeat BMP in 2 weeks  Eschar of heel/DTI Continue with Boots  Have to control Edema  Pressure injury of coccygeal region, stage 2 (HCC) On Polymem Dressing Chronic kidney disease, stage 3b (HCC) Stable Creat, Near his Baseline Paroxysmal atrial fibrillation (HCC) On Eliquis Has Bradycardia Chronic congestive heart failure,  Restart low dose of Lasix for LE Edema Acquired hypothyroidism TSh normal in 2/21 Bradycardia Supportive care    Family/ staff Communication:   Labs/tests ordered:

## 2021-03-02 DIAGNOSIS — R7989 Other specified abnormal findings of blood chemistry: Secondary | ICD-10-CM | POA: Diagnosis not present

## 2021-03-02 LAB — CBC AND DIFFERENTIAL
HCT: 33 — AB (ref 41–53)
Hemoglobin: 11 — AB (ref 13.5–17.5)
Platelets: 224 (ref 150–399)
WBC: 5.4

## 2021-03-02 LAB — BASIC METABOLIC PANEL
BUN: 39 — AB (ref 4–21)
CO2: 25 — AB (ref 13–22)
Chloride: 107 (ref 99–108)
Creatinine: 1.5 — AB (ref 0.6–1.3)
Glucose: 92
Potassium: 3.7 (ref 3.4–5.3)
Sodium: 143 (ref 137–147)

## 2021-03-02 LAB — HEPATIC FUNCTION PANEL
ALT: 19 (ref 10–40)
AST: 16 (ref 14–40)
Alkaline Phosphatase: 181 — AB (ref 25–125)
Bilirubin, Total: 1.6

## 2021-03-02 LAB — LIPID PANEL

## 2021-03-02 LAB — COMPREHENSIVE METABOLIC PANEL
Albumin: 3 — AB (ref 3.5–5.0)
Calcium: 8.5 — AB (ref 8.7–10.7)
Globulin: 2.3

## 2021-03-02 LAB — CBC: RBC: 3.46 — AB (ref 3.87–5.11)

## 2021-03-05 DIAGNOSIS — R7989 Other specified abnormal findings of blood chemistry: Secondary | ICD-10-CM | POA: Diagnosis not present

## 2021-03-05 LAB — HEPATIC FUNCTION PANEL
ALT: 16 (ref 10–40)
AST: 17 (ref 14–40)
Alkaline Phosphatase: 184 — AB (ref 25–125)
Bilirubin, Direct: 0.96 — AB (ref 0.01–0.4)
Bilirubin, Total: 1.6

## 2021-03-05 LAB — COMPREHENSIVE METABOLIC PANEL: Albumin: 3 — AB (ref 3.5–5.0)

## 2021-03-15 DIAGNOSIS — N189 Chronic kidney disease, unspecified: Secondary | ICD-10-CM | POA: Diagnosis not present

## 2021-03-15 LAB — BASIC METABOLIC PANEL
BUN: 35 — AB (ref 4–21)
CO2: 25 — AB (ref 13–22)
Chloride: 105 (ref 99–108)
Creatinine: 1.6 — AB (ref 0.6–1.3)
Glucose: 94
Potassium: 3.6 (ref 3.4–5.3)
Sodium: 140 (ref 137–147)

## 2021-03-15 LAB — COMPREHENSIVE METABOLIC PANEL: Calcium: 8.5 — AB (ref 8.7–10.7)

## 2021-03-19 ENCOUNTER — Telehealth: Payer: Self-pay | Admitting: Adult Health

## 2021-03-19 DIAGNOSIS — L89152 Pressure ulcer of sacral region, stage 2: Secondary | ICD-10-CM

## 2021-03-19 MED ORDER — TRAMADOL HCL 50 MG PO TABS: 25.0000 mg | ORAL_TABLET | Freq: Four times a day (QID) | ORAL | 1 refills | Status: AC | PRN

## 2021-03-19 NOTE — Telephone Encounter (Signed)
Nurse called to report that pt is having pain where is pressure injury is located at the coccyx area that is not relieved by tylenol. Will add ultram in small dosage due to his age and medical hx.

## 2021-03-25 ENCOUNTER — Encounter: Payer: Self-pay | Admitting: Adult Health

## 2021-03-25 ENCOUNTER — Non-Acute Institutional Stay (SKILLED_NURSING_FACILITY): Payer: Medicare Other | Admitting: Adult Health

## 2021-03-25 DIAGNOSIS — E440 Moderate protein-calorie malnutrition: Secondary | ICD-10-CM

## 2021-03-25 DIAGNOSIS — I482 Chronic atrial fibrillation, unspecified: Secondary | ICD-10-CM | POA: Diagnosis not present

## 2021-03-25 DIAGNOSIS — L089 Local infection of the skin and subcutaneous tissue, unspecified: Secondary | ICD-10-CM | POA: Diagnosis not present

## 2021-03-25 DIAGNOSIS — T148XXA Other injury of unspecified body region, initial encounter: Secondary | ICD-10-CM

## 2021-03-25 DIAGNOSIS — I509 Heart failure, unspecified: Secondary | ICD-10-CM | POA: Diagnosis not present

## 2021-03-25 DIAGNOSIS — L89152 Pressure ulcer of sacral region, stage 2: Secondary | ICD-10-CM | POA: Diagnosis not present

## 2021-03-25 DIAGNOSIS — R5383 Other fatigue: Secondary | ICD-10-CM | POA: Diagnosis not present

## 2021-03-25 DIAGNOSIS — R001 Bradycardia, unspecified: Secondary | ICD-10-CM | POA: Diagnosis not present

## 2021-03-25 MED ORDER — DOXYCYCLINE HYCLATE 100 MG PO TABS: 100.0000 mg | ORAL_TABLET | Freq: Two times a day (BID) | ORAL | 0 refills | Status: AC

## 2021-03-25 NOTE — Progress Notes (Signed)
Location:  [facility] Room Number: 143-A Place of Service:  SNF [phone]) Provider:  Royal Hawthorn, NP   Patient Care Team: Virgie Dad, MD as PCP - General (Internal Medicine) [facility] [name], DMD as Consulting Physician (Oral Surgery)  Extended Emergency Contact Information Primary Emergency Contact: [facility] Address: [address]          [place], [place] [address] [name] of [place] Phone: [phone] Relation: Daughter Secondary Emergency Contact: Fawver,Rees Address: [place], [place] [address] [name] of [place] Phone: [phone] Mobile Phone: [phone] Relation: Son  Code Status:  DNR  Goals of care: Advanced Directive information Advanced Directives 03/25/2021  Does Patient Have a Medical Advance Directive? Yes  Type of Paramedic of [place];Living will;Out of facility DNR (pink MOST or yellow form)  Does patient want to make changes to medical advance directive? No - Patient declined  Copy of [place] in Chart? Yes - validated most recent copy scanned in chart (See row information)  Would patient like information on creating a medical advance directive? -  Pre-existing out of facility DNR order (yellow form or pink MOST form) Yellow form placed in chart (order not valid for inpatient use)     Chief Complaint  Patient presents with   Medical Management of Chronic Issues    Routine Visit and discuss need for covid vaccine #4 or exclude    HPI:  Pt is a 87 y.o. male seen today for medical management of chronic diseases.   Mr. Deriso underwent ERCP on 6/19 with stone removal and sphincterectomy.  Since he discharged from the hospital after this procedure he has had periods of lethargy. He continues to use his PWC and go down to the [place] dining room to eat but OT has had to slow the speed to prevent unsafe  driving. He is alert and pleasant for my visit. His main concern is that his left heel hurts. He has a pressure injury with eschar to this area. Prior to this there was concern he had PVD due to heel pain at night. He is still wearing shoes and its painful to put them on. He also has a pressure injury to his buttocks with drainage, pain, redness, and worsening depth. He does not like to lie on his side. He does have an air mattress. HR ranges in the 40-50 range. No issues with syncope or palpitations. He has CHF and remains on low dose lasix. Weight variable but no significant changes. No sob or doe.   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   Hyperglycemia 01/02/2013   Hypertrophy of prostate without urinary obstruction and other lower urinary tract symptoms (LUTS) 2002   Insomnia with sleep apnea, unspecified 02/2010   Irritable bowel syndrome 11/14/2011   Long term (current) use of anticoagulants 11/2009   Mild cognitive impairment 04/19/2020   Other abnormal blood chemistry 02/09/2009   Other B-complex  deficiencies 2008   Other dyspnea and respiratory abnormality 11/2009   Other malaise and fatigue 2007   Pain in joint, lower leg 02/2010   Palpitations 2007   Reflux esophagitis 2007   Sebaceous cyst 2008   Tension headache 2009   Undiagnosed cardiac murmurs 2002   Unspecified constipation 07/04/2012   Unspecified essential hypertension 2007   Unspecified hereditary and idiopathic peripheral neuropathy 02/2010   Unspecified transient cerebral  ischemia 2002   Urinary frequency 10/01/2012   Past Surgical History:  Procedure Laterality Date   ENDOSCOPIC RETROGRADE CHOLANGIOPANCREATOGRAPHY (ERCP) WITH PROPOFOL N/A 02/21/2021   Procedure: ENDOSCOPIC RETROGRADE CHOLANGIOPANCREATOGRAPHY (ERCP) WITH PROPOFOL;  Surgeon: Irving Copas., MD;  Location: [facility];  Service: Gastroenterology;  Laterality: N/A;   REMOVAL OF STONES  02/21/2021   Procedure: REMOVAL OF STONES;  Surgeon: Rush Landmark Telford Nab., MD;  Location: [facility];  Service: Gastroenterology;;   Joan Mayans  02/21/2021   Procedure: Joan Mayans;  Surgeon: Mansouraty, Telford Nab., MD;  Location: [facility] ENDOSCOPY;  Service: Gastroenterology;;    Allergies  Allergen Reactions   Other Other (See Comments)    Peppers. Unknown reaction   Plavix [Clopidogrel Bisulfate] Other (See Comments)    Indigestion, GI upset    Outpatient Encounter Medications as of 03/25/2021  Medication Sig   acetaminophen (TYLENOL) 500 MG tablet Take 500 mg by mouth at bedtime.   Acetaminophen 500 MG coapsule Take 2 capsules by mouth 2 (two) times daily as needed.   Amino Acids-Protein Hydrolys (FEEDING SUPPLEMENT, PRO-STAT 64,) LIQD Take 30 mLs by mouth daily with breakfast.   apixaban (ELIQUIS) 2.5 MG TABS tablet Take 1 tablet (2.5 mg total) by mouth 2 (two) times daily. For Afib and stroke prophylaxis   doxazosin (CARDURA) 2 MG tablet Take 1 tablet (2 mg total) by mouth daily.   Emollient (CERAVE DAILY MOISTURIZING) LOTN Apply topically. Apply to legs/feet before compression socks   Eyelid Cleansers (OCUSOFT EYELID CLEANSING) PADS Apply 1 application topically as directed.   fluticasone (FLONASE) 50 MCG/ACT nasal spray Place 2 sprays into both nostrils 2 (two) times daily.   furosemide (LASIX) 20 MG tablet Take 20 mg by mouth.   gabapentin (NEURONTIN) 100 MG capsule Take 100 mg by mouth at bedtime.   Lactase 9000 units CHEW chew 1 tab (9,000 u) by mouth w/ first bite of milk product  PRN lactose intolerant.   Levothyroxine Sodium 50 MCG CAPS Take 1 capsule (50 mcg total) by mouth daily before breakfast.   Melatonin 3 MG CAPS Take 1 capsule (3 mg total) by mouth at bedtime.   mupirocin ointment (BACTROBAN) 2 % Apply 1 application topically as needed. Apply to left buttock skin tag, no more than 2 times a day until healed   omeprazole (PRILOSEC OTC) 20 MG tablet Take 20 mg by mouth daily.   Polyvinyl Alcohol-Povidone PF 1.4-0.6 % SOLN Apply 1.4 % to eye 2 (two) times daily.    potassium chloride (KLOR-CON) 10 MEQ tablet Take 10 mEq by mouth daily.   traMADol HCl (ULTRAM PO) Take 25 mg by mouth every 6 (six) hours as needed.   trolamine salicylate (ASPERCREME) 10 % cream Apply 1 application topically 2 (two) times daily as needed. Apply to right shoulder and knees   vitamin B-12 (CYANOCOBALAMIN) 1000 MCG tablet Take 1,000 mcg by mouth daily.   No facility-administered encounter medications on file as of 03/25/2021.    Review of Systems  Constitutional:  Positive for fatigue. Negative for activity change, appetite change, chills, diaphoresis,  fever and unexpected weight change.  Respiratory:  Negative for cough, shortness of breath, wheezing and stridor.   Cardiovascular:  Positive for leg swelling. Negative for chest pain and palpitations.  Gastrointestinal:  Negative for abdominal distention, abdominal pain, constipation and diarrhea.  Genitourinary:  Negative for difficulty urinating and dysuria.  Musculoskeletal:  Positive for gait problem. Negative for arthralgias, back pain, joint swelling and myalgias.  Skin:  Positive for wound.  Neurological:  Negative for dizziness, seizures, syncope, facial asymmetry, speech difficulty, weakness and headaches.  Hematological:  Negative for adenopathy. Does not bruise/bleed easily.  Psychiatric/Behavioral:  Positive for confusion. Negative for agitation and behavioral problems.    Immunization History  Administered Date(s)  Administered   Influenza Inj Mdck Quad Pf 06/23/2016   Influenza, High Dose Seasonal PF 07/04/2019, 06/26/2020, 07/03/2020   Influenza,inj,Quad PF,6+ Mos 07/05/2018   Influenza-Unspecified 06/11/2013, 06/23/2014, 06/25/2015, 06/29/2017, 06/26/2020   Moderna SARS-COV2 Booster Vaccination 07/18/2020   Moderna Sars-Covid-2 Vaccination 09/17/2019, 10/30/2019   Pneumococcal Conjugate-13 09/09/2015   Pneumococcal Polysaccharide-23 09/05/2008   Td 09/05/1978   Tdap 02/23/2017   Zoster Recombinat (Shingrix) 10/02/2017, 03/01/2018   Pertinent  Health Maintenance Due  Topic Date Due   INFLUENZA VACCINE  04/05/2021   PNA vac Low Risk Adult  Completed   Fall Risk  04/19/2020 04/15/2020 04/15/2020 12/11/2019 07/31/2019  Falls in the past year? - 0 0 0 0  Comment - - - - Emmi Telephone Survey: data to providers prior to load  Number falls in past yr: - 0 0 0 -  Injury with Fall? - 0 0 0 -  Risk for fall due to : History of fall(s);Impaired balance/gait;Impaired mobility - - - -  Follow up Falls prevention discussed;Education provided;Falls evaluation completed - - - -   Functional Status Survey:    Vitals:   03/25/21 1239  BP: 138/67  Pulse: (!) 47  Resp: 18  Temp: 98.1 F (36.7 C)  SpO2: 95%  Weight: 180 lb 6.4 oz (81.8 kg)  Height: 5\' 9"  (1.753 m)   Body mass index is 26.64 kg/m. Physical Exam Vitals and nursing note reviewed.  Constitutional:      General: He is not in acute distress.    Appearance: He is not diaphoretic.  HENT:     Head: Normocephalic and atraumatic.     Mouth/Throat:     Mouth: Mucous membranes are moist.     Pharynx: Oropharynx is clear.  Neck:     Thyroid: No thyromegaly.     Vascular: No JVD.     Trachea: No tracheal deviation.  Cardiovascular:     Rate and Rhythm: Bradycardia present. Rhythm irregular.     Heart sounds: No murmur heard. Pulmonary:     Effort: Pulmonary effort is normal. No respiratory distress.     Breath sounds: Normal breath  sounds. No wheezing.  Abdominal:     General: Bowel sounds are normal. There is no distension.     Palpations: Abdomen is soft.     Tenderness: There is no abdominal tenderness.  Musculoskeletal:     Comments: Trace BLE edema  Lymphadenopathy:     Cervical: No cervical adenopathy.  Skin:    General: Skin is warm and dry.     Comments: Left heel: black eschar tissue noted. No surrounding erythema, area tender  Pressure injury stage 2 noted to coccyx area with tunneling. Surrounding erythema noted. Foul odor, drainage with clear yellow color. 50% black/yellow 50% pink  Neurological:  Mental Status: He is alert and oriented to person, place, and time.     Cranial Nerves: No cranial nerve deficit.  Psychiatric:        Mood and Affect: Mood normal.    Labs reviewed: Recent Labs    02/18/21 2118 02/19/21 0245 02/20/21 0745 02/21/21 0132 02/22/21 1359 02/23/21 0402 02/26/21 0000 03/02/21 0000 03/15/21 0000  NA 142 139   < > 139 139 142 144 143 140  K 3.8 3.3*   < > 3.6 3.7 3.9 3.8 3.7 3.6  CL 104 102   < > 106 105 107 107 107 105  CO2 27 26   < > 26 29 24  27* 25* 25*  GLUCOSE 108* 97   < > 117* 116* 101*  --   --   --   BUN 51* 46*   < > 36* 33* 33* 34* 39* 35*  CREATININE 2.20* 2.03*   < > 1.75* 1.76* 1.73* 1.5* 1.5* 1.6*  CALCIUM 8.1* 8.2*   < > 8.0* 8.4* 8.5* 8.6* 8.5* 8.5*  MG 2.1 1.8  --   --   --  2.1  --   --   --    < > = values in this interval not displayed.   Recent Labs    02/21/21 0132 02/22/21 1359 02/23/21 0402 02/26/21 0000 03/02/21 0000 03/05/21 0000  AST 39 25 20 18 16 17   ALT 56* 46* 40 27 19 16   ALKPHOS 208* 174* 158* 207* 181* 184*  BILITOT 4.4* 3.3* 2.9*  --   --   --   PROT 5.6* 5.7* 5.5*  --   --   --   ALBUMIN 2.3* 2.3* 2.3* 2.8* 3.0* 3.0*   Recent Labs    09/13/20 0000 09/14/20 0000 02/19/21 0245 02/20/21 0745 02/22/21 1359 02/26/21 0000 03/02/21 0000  WBC 12.1   < > 5.2 6.0 7.2 4.6 5.4  NEUTROABS 10.50  --  4.0  --   --   --    --   HGB 12.8*   < > 11.5* 12.1* 11.4* 11.4* 11.0*  HCT 39*   < > 35.2* 37.2* 37.2* 35* 33*  MCV  --    < > 97.2 96.6 101.6*  --   --   PLT 146*   < > 146* 151 165 195 224   < > = values in this interval not displayed.   Lab Results  Component Value Date   TSH 2.59 10/09/2019   Lab Results  Component Value Date   HGBA1C 5.3 03/01/2016   Lab Results  Component Value Date   CHOL 119 03/01/2016   HDL 56 03/01/2016   LDLCALC 52 03/01/2016   TRIG 54 03/01/2016    Significant Diagnostic Results in last 30 days:  No results found.  Assessment/Plan  1. Pressure injury of coccygeal region, stage 2 (HCC) Worsening with drainage, tunneling, and redness Continue dressing changes daily Air mattress Pressure relief  2. Wound infection Doxycycline 100 mg bid with food for 7 days Diflucan 100 mg  x1  3. Deep tissue injury Continue elevation and dressing changes/skin prep Avoid foot wear  4. Atrial fibrillation, chronic (HCC) Rate is controlled Continue Eliquis for CVA risk reduction   5. Lethargy Intermittent sense anesthesia for ERCP Likely due to this, will continue to monitor.   6. Chronic congestive heart failure, unspecified heart failure type (HCC) Continue weights Continue Lasix 20 mg qd  Monitor BMP  7. Bradycardia Hx of SSS, no  pacemaker due to goals of care.   8. Moderate protein-calorie malnutrition (HCC) Alb 3.0 03/05/21  Lab Results  Component Value Date   LABPROT 17.3 (H) 02/19/2021   Continue prostat for wound healing.    Family/ staff Communication: resident   Labs/tests ordered:  NA

## 2021-03-25 NOTE — Progress Notes (Signed)
This encounter was created in error - please disregard.

## 2021-04-02 ENCOUNTER — Encounter: Payer: Self-pay | Admitting: Adult Health

## 2021-04-02 ENCOUNTER — Non-Acute Institutional Stay (SKILLED_NURSING_FACILITY): Payer: Medicare Other | Admitting: Adult Health

## 2021-04-02 DIAGNOSIS — R410 Disorientation, unspecified: Secondary | ICD-10-CM

## 2021-04-02 DIAGNOSIS — T148XXA Other injury of unspecified body region, initial encounter: Secondary | ICD-10-CM

## 2021-04-02 DIAGNOSIS — E440 Moderate protein-calorie malnutrition: Secondary | ICD-10-CM

## 2021-04-02 DIAGNOSIS — L89152 Pressure ulcer of sacral region, stage 2: Secondary | ICD-10-CM | POA: Diagnosis not present

## 2021-04-02 NOTE — Progress Notes (Signed)
Location:   [facility] Room Number: 825 Place of Service:  SNF [phone]) Provider:  Royal Hawthorn, NP  Virgie Dad, MD  Patient Care Team: Virgie Dad, MD as PCP - General (Internal Medicine) [facility] [name], DMD as Consulting Physician (Oral Surgery)  Extended Emergency Contact Information Primary Emergency Contact: [facility] Address: [address]          [place], [place] [address] [name] of [place] Phone: [phone] Relation: Daughter Secondary Emergency Contact: Petrella,Takuma Address: [place], [place] [address] [name] of [place] Phone: [phone] Mobile Phone: [phone] Relation: Son  Code Status:  DNR Goals of care: Advanced Directive information Advanced Directives 04/02/2021  Does Patient Have a Medical Advance Directive? Yes  Type of Paramedic of [place];Living will;Out of facility DNR (pink MOST or yellow form)  Does patient want to make changes to medical advance directive? No - Patient declined  Copy of [place] in Chart? Yes - validated most recent copy scanned in chart (See row information)  Would patient like information on creating a medical advance directive? -  Pre-existing out of facility DNR order (yellow form or pink MOST form) Yellow form placed in chart (order not valid for inpatient use)     Chief Complaint  Patient presents with   Acute Visit    Confusion and f/u wound check     HPI:  Pt is a 87 y.o. male seen today for an acute visit for confusion ad a wound check. Gary [name] underwent ERCP on 6/19 with stone removal and sphincterectomy.  When he returned from the hospital he had a stage 2 pressure injury to the coccyx area. He has completed 1 week course of doxycycline for infection to the area. The drainage and redness have improved but the wound continues to have tunneling and  slough tissue. They are using santyl with silver packing/dressing changes. He is not having a fever. He had some sore throat several days ago but this improved. He recovered from covid in June. His nurse reports that he can be agitated or irritable at times and slightly confused from his baseline which is alert and oriented. This has been an issue since having anesthesia and his acute illness. We have been monitoring his CMP periodically and found his labs to be at baseline.  Albumin was low at 3 on 03/05/21.    He also has an area of black tissue to the left heel that was present upon his return from the hospital that is painful.  He does not like to turn to each side. Has an air mattress.   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   Hyperglycemia 01/02/2013   Hypertrophy of prostate without urinary obstruction and other lower urinary tract symptoms (LUTS) 2002   Insomnia with sleep apnea, unspecified 02/2010   Irritable bowel syndrome 11/14/2011   Long term (current) use of anticoagulants 11/2009  Mild cognitive impairment 04/19/2020   Other abnormal blood chemistry 02/09/2009   Other B-complex deficiencies 2008   Other dyspnea and respiratory abnormality 11/2009   Other malaise and fatigue 2007   Pain in joint, lower leg 02/2010   Palpitations 2007   Reflux esophagitis 2007   Sebaceous cyst 2008   Tension headache 2009   Undiagnosed cardiac murmurs 2002   Unspecified constipation 07/04/2012   Unspecified essential hypertension 2007   Unspecified  hereditary and idiopathic peripheral neuropathy 02/2010   Unspecified transient cerebral ischemia 2002   Urinary frequency 10/01/2012   Past Surgical History:  Procedure Laterality Date   ENDOSCOPIC RETROGRADE CHOLANGIOPANCREATOGRAPHY (ERCP) WITH PROPOFOL N/A 02/21/2021   Procedure: ENDOSCOPIC RETROGRADE CHOLANGIOPANCREATOGRAPHY (ERCP) WITH PROPOFOL;  Surgeon: Irving Copas., MD;  Location: [facility];  Service: Gastroenterology;  Laterality: N/A;   REMOVAL OF STONES  02/21/2021   Procedure: REMOVAL OF STONES;  Surgeon: Rush Landmark Telford Nab., MD;  Location: [facility];  Service: Gastroenterology;;   Joan Mayans  02/21/2021   Procedure: Joan Mayans;  Surgeon: Mansouraty, Telford Nab., MD;  Location: [facility] ENDOSCOPY;  Service: Gastroenterology;;    Allergies  Allergen Reactions   Other Other (See Comments)    Peppers. Unknown reaction   Plavix [Clopidogrel Bisulfate] Other (See Comments)    Indigestion, GI upset    Allergies as of 04/02/2021       Reactions   Other Other (See Comments)   Peppers. Unknown reaction   Plavix [clopidogrel Bisulfate] Other (See Comments)   Indigestion, GI upset        Medication List        Accurate as of April 02, 2021  9:52 AM. If you have any questions, ask your nurse or doctor.          Acetaminophen 500 MG capsule Take 2 capsules by mouth 2 (two) times daily as needed.   acetaminophen 500 MG tablet Commonly known as: TYLENOL Take 500 mg by mouth at bedtime.   apixaban 2.5 MG Tabs tablet Commonly known as: Eliquis Take 1 tablet (2.5 mg total) by mouth 2 (two) times daily. For Afib and stroke prophylaxis   CeraVe Daily Moisturizing Lotn Apply topically. Apply to legs/feet before compression socks   doxazosin 2 MG tablet Commonly known as: Cardura Take 1 tablet (2 mg total) by mouth daily.   feeding supplement (PRO-STAT 64) Liqd Take 30 mLs by mouth daily with breakfast.   fluticasone 50 MCG/ACT nasal  spray Commonly known as: FLONASE Place 2 sprays into both nostrils 2 (two) times daily.   furosemide 20 MG tablet Commonly known as: LASIX Take 20 mg by mouth.   gabapentin 100 MG capsule Commonly known as: NEURONTIN Take 100 mg by mouth at bedtime.   Lactase 9000 units Chew chew 1 tab (9,000 u) by mouth w/ first bite of milk product PRN lactose intolerant.   Levothyroxine Sodium 50 MCG Caps Take 1 capsule (50 mcg total) by mouth daily before breakfast.   Melatonin 3 MG Caps Take 1 capsule (3 mg total) by mouth at bedtime.   mupirocin ointment 2 % Commonly known as: BACTROBAN Apply 1 application topically as needed. Apply to left buttock skin tag, no more than 2 times a day until healed   OcuSoft Eyelid Cleansing Pads Apply 1 application topically as directed.   omeprazole 20 MG tablet Commonly known as: PRILOSEC OTC Take 20 mg by mouth daily.   phenol 1.4 % Liqd Commonly known as: CHLORASEPTIC Use as directed 1 spray in the  mouth or throat as needed for throat irritation / pain.   Polyvinyl Alcohol-Povidone PF 1.4-0.6 % Soln Apply 1.4 % to eye 2 (two) times daily.   potassium chloride 10 MEQ tablet Commonly known as: KLOR-CON Take 10 mEq by mouth daily.   trolamine salicylate 10 % cream Commonly known as: ASPERCREME Apply 1 application topically 2 (two) times daily as needed. Apply to right shoulder and knees   ULTRAM PO Take 25 mg by mouth every 6 (six) hours as needed.   vitamin B-12 1000 MCG tablet Commonly known as: CYANOCOBALAMIN Take 1,000 mcg by mouth daily.        Review of Systems  Constitutional:  Negative for activity change, appetite change, chills, diaphoresis, fatigue, fever and unexpected weight change.  Respiratory:  Negative for cough, shortness of breath, wheezing and stridor.   Cardiovascular:  Positive for leg swelling. Negative for chest pain and palpitations.  Gastrointestinal:  Negative for abdominal distention, abdominal pain,  constipation and diarrhea.  Genitourinary:  Negative for difficulty urinating and dysuria.  Musculoskeletal:  Positive for gait problem. Negative for arthralgias, back pain, joint swelling and myalgias.  Skin:  Positive for wound.  Neurological:  Negative for dizziness, seizures, syncope, facial asymmetry, speech difficulty, weakness and headaches.  Hematological:  Negative for adenopathy. Does not bruise/bleed easily.  Psychiatric/Behavioral:  Positive for agitation and confusion. Negative for behavioral problems.    Immunization History  Administered Date(s) Administered   Influenza Inj Mdck Quad Pf 06/23/2016   Influenza, High Dose Seasonal PF 07/04/2019, 06/26/2020, 07/03/2020   Influenza,inj,Quad PF,6+ Mos 07/05/2018   Influenza-Unspecified 06/11/2013, 06/23/2014, 06/25/2015, 06/29/2017, 06/26/2020   Moderna SARS-COV2 Booster Vaccination 07/18/2020   Moderna Sars-Covid-2 Vaccination 09/17/2019, 10/30/2019   Pneumococcal Conjugate-13 09/09/2015   Pneumococcal Polysaccharide-23 09/05/2008   Td 09/05/1978   Tdap 02/23/2017   Zoster Recombinat (Shingrix) 10/02/2017, 03/01/2018   Pertinent  Health Maintenance Due  Topic Date Due   INFLUENZA VACCINE  04/05/2021   PNA vac Low Risk Adult  Completed   Fall Risk  04/19/2020 04/15/2020 04/15/2020 12/11/2019 07/31/2019  Falls in the past year? - 0 0 0 0  Comment - - - - Emmi Telephone Survey: data to providers prior to load  Number falls in past yr: - 0 0 0 -  Injury with Fall? - 0 0 0 -  Risk for fall due to : History of fall(s);Impaired balance/gait;Impaired mobility - - - -  Follow up Falls prevention discussed;Education provided;Falls evaluation completed - - - -   Functional Status Survey:    Vitals:   04/02/21 0943  BP: (!) 132/54  Pulse: (!) 46  Resp: 18  Temp: (!) 97.5 F (36.4 C)  SpO2: 92%  Weight: 178 lb (80.7 kg)  Height: 5\' 9"  (1.753 m)   Body mass index is 26.29 kg/m. Physical Exam Vitals and nursing note  reviewed.  Constitutional:      General: He is not in acute distress.    Appearance: He is not diaphoretic.  HENT:     Head: Normocephalic and atraumatic.     Nose: No congestion or rhinorrhea.     Mouth/Throat:     Mouth: Mucous membranes are moist.     Pharynx: Oropharynx is clear. No oropharyngeal exudate or posterior oropharyngeal erythema.  Neck:     Thyroid: No thyromegaly.     Vascular: No JVD.     Trachea: No tracheal deviation.  Cardiovascular:     Rate and Rhythm: Bradycardia present. Rhythm irregular.  Heart sounds: No murmur heard. Pulmonary:     Effort: Pulmonary effort is normal. No respiratory distress.     Breath sounds: Normal breath sounds. No wheezing.  Abdominal:     General: Bowel sounds are normal. There is no distension.     Palpations: Abdomen is soft.     Tenderness: There is no abdominal tenderness.  Musculoskeletal:     Right lower leg: Edema (+1) present.     Left lower leg: Edema (+1) present.  Lymphadenopathy:     Cervical: No cervical adenopathy.  Skin:    General: Skin is warm and dry.     Comments: Left heel: black eschar tissue present and beginning to lift. No drainage or erythema. Area is tender to touch  Buttocks: stage 2 pressure injury to the coccyx/sacral area has yellow slough tissue 80% 20% pink tissue. Mild surrounding erythema which is improved. No odor. Serous drainage present. No black tissue. Tunneling present.   Neurological:     Mental Status: He is alert and oriented to person, place, and time.     Cranial Nerves: No cranial nerve deficit.  Psychiatric:        Mood and Affect: Mood normal.    Labs reviewed: Recent Labs    02/18/21 2118 02/19/21 0245 02/20/21 0745 02/21/21 0132 02/22/21 1359 02/23/21 0402 02/26/21 0000 03/02/21 0000 03/15/21 0000  NA 142 139   < > 139 139 142 144 143 140  K 3.8 3.3*   < > 3.6 3.7 3.9 3.8 3.7 3.6  CL 104 102   < > 106 105 107 107 107 105  CO2 27 26   < > 26 29 24  27* 25* 25*   GLUCOSE 108* 97   < > 117* 116* 101*  --   --   --   BUN 51* 46*   < > 36* 33* 33* 34* 39* 35*  CREATININE 2.20* 2.03*   < > 1.75* 1.76* 1.73* 1.5* 1.5* 1.6*  CALCIUM 8.1* 8.2*   < > 8.0* 8.4* 8.5* 8.6* 8.5* 8.5*  MG 2.1 1.8  --   --   --  2.1  --   --   --    < > = values in this interval not displayed.   Recent Labs    02/21/21 0132 02/22/21 1359 02/23/21 0402 02/26/21 0000 03/02/21 0000 03/05/21 0000  AST 39 25 20 18 16 17   ALT 56* 46* 40 27 19 16   ALKPHOS 208* 174* 158* 207* 181* 184*  BILITOT 4.4* 3.3* 2.9*  --   --   --   PROT 5.6* 5.7* 5.5*  --   --   --   ALBUMIN 2.3* 2.3* 2.3* 2.8* 3.0* 3.0*   Recent Labs    09/13/20 0000 09/14/20 0000 02/19/21 0245 02/20/21 0745 02/22/21 1359 02/26/21 0000 03/02/21 0000  WBC 12.1   < > 5.2 6.0 7.2 4.6 5.4  NEUTROABS 10.50  --  4.0  --   --   --   --   HGB 12.8*   < > 11.5* 12.1* 11.4* 11.4* 11.0*  HCT 39*   < > 35.2* 37.2* 37.2* 35* 33*  MCV  --    < > 97.2 96.6 101.6*  --   --   PLT 146*   < > 146* 151 165 195 224   < > = values in this interval not displayed.   Lab Results  Component Value Date   TSH 2.59 10/09/2019   Lab Results  Component Value Date   HGBA1C 5.3 03/01/2016   Lab Results  Component Value Date   CHOL 119 03/01/2016   HDL 56 03/01/2016   LDLCALC 52 03/01/2016   TRIG 54 03/01/2016    Significant Diagnostic Results in last 30 days:  No results found.  Assessment/Plan  1. Delirium He is having periods of this due to his recent hospitalization and anesthesia. For my visit he is alert, oriented, and pleasant. He is also having pain due to his wounds and this may contribute.   2. Pressure injury of coccygeal region, stage 2 (HCC) Improved Continue silver packing dressing changes Pressure relief Increase ultram 50 mg q 6 prn for pain and monitor for sedation.  He has a hx of QT interval prolongation. Ultram will be used sparingly. Right now he uses it for dressing changes. Goals of care are  comfort based.   3. Deep tissue injury Heel boots Skin prep Pressure relief Remains painful On neurontin, see #2  4. Moderate protein-calorie malnutrition ([place]) Was trying prostat, now trying juven to help with protein intake  Family/ staff Communication: resident and nurse  Labs/tests ordered:   NA

## 2021-04-04 ENCOUNTER — Encounter: Payer: Self-pay | Admitting: Adult Health

## 2021-04-04 MED ORDER — TRAMADOL HCL 50 MG PO TABS: 50.0000 mg | ORAL_TABLET | Freq: Four times a day (QID) | ORAL | 0 refills | Status: AC | PRN

## 2021-04-19 ENCOUNTER — Encounter (HOSPITAL_BASED_OUTPATIENT_CLINIC_OR_DEPARTMENT_OTHER): Payer: Medicare Other | Attending: Internal Medicine | Admitting: Internal Medicine

## 2021-04-19 ENCOUNTER — Other Ambulatory Visit: Payer: Self-pay

## 2021-04-19 DIAGNOSIS — L89100 Pressure ulcer of unspecified part of back, unstageable: Secondary | ICD-10-CM

## 2021-04-19 DIAGNOSIS — F0390 Unspecified dementia without behavioral disturbance: Secondary | ICD-10-CM | POA: Insufficient documentation

## 2021-04-19 DIAGNOSIS — E440 Moderate protein-calorie malnutrition: Secondary | ICD-10-CM | POA: Diagnosis not present

## 2021-04-19 DIAGNOSIS — L89620 Pressure ulcer of left heel, unstageable: Secondary | ICD-10-CM | POA: Insufficient documentation

## 2021-04-19 DIAGNOSIS — I482 Chronic atrial fibrillation, unspecified: Secondary | ICD-10-CM | POA: Insufficient documentation

## 2021-04-19 DIAGNOSIS — N186 End stage renal disease: Secondary | ICD-10-CM | POA: Diagnosis not present

## 2021-04-20 NOTE — Progress Notes (Addendum)
Gary [name] ([id number]) Visit Report for 04/19/2021 Allergy List Details Patient Name: Date of Service: CRO Gary [name] 04/19/2021 1:15 PM Medical Record Number: [id number] Patient Account Number: [id number] Date of Birth/Sex: Treating RN: Jun 09, 1916 (87 y.o. Gary [name] Primary Care Deniz Eskridge: Veleta Miners Other Clinician: Referring Ranetta Armacost: Treating Anevay Campanella/Extender: Kathie Rhodes Weeks in Treatment: 0 Allergies Active Allergies pepper (genus Capsicum) Plavix Allergy Notes Electronic Signature(s) Signed: 04/20/2021 5:16:57 PM By: Rhae Hammock RN Entered By: Rhae Hammock on 04/19/2021 13:07:20 -------------------------------------------------------------------------------- Arrival Information Details Patient Name: Date of Service: CRO Gary [name]. 04/19/2021 1:15 PM Medical Record Number: [id number] Patient Account Number: [id number] Date of Birth/Sex: Treating RN: [birth year] (87 y.o. Gary [name] Primary Care Flavia Bruss: Veleta Miners Other Clinician: Referring Jeanluc Wegman: Treating Thi Sisemore/Extender: Cleda Daub in Treatment: 0 Visit Information Patient Arrived: Wheel Chair Arrival Time: 13:06 Accompanied By: self Transfer Assistance: Stormy Fabian Patient Identification Verified: Yes Secondary Verification Process Completed: Yes Patient Requires Transmission-Based Precautions: No Patient Has Alerts: No Electronic Signature(s) Signed: 04/20/2021 5:16:57 PM By: Rhae Hammock RN Entered By: Rhae Hammock on 04/19/2021 13:06:16 -------------------------------------------------------------------------------- Clinic Level of Care Assessment Details Patient Name: Date of Service: CRO Gary [name] 04/19/2021 1:15 PM Medical Record Number: [id number] Patient Account Number: [id number] Date of Birth/Sex: Treating RN: [birth year] (87 y.o. Gary [name] Primary Care Dhaval Woo: Veleta Miners  Other Clinician: Referring Veera Stapleton: Treating Cina Klumpp/Extender: Cleda Daub in Treatment: 0 Clinic Level of Care Assessment Items TOOL 2 Quantity Score X- 1 0 Use when only an EandM is performed on the INITIAL visit ASSESSMENTS - Nursing Assessment / Reassessment X- 1 20 General Physical Exam (combine w/ comprehensive assessment (listed just below) when performed on new pt. evals) X- 1 25 Comprehensive Assessment (HX, ROS, Risk Assessments, Wounds Hx, etc.) ASSESSMENTS - Wound and Skin A ssessment / Reassessment []  - 0 Simple Wound Assessment / Reassessment - one wound X- 2 5 Complex Wound Assessment / Reassessment - multiple wounds []  - 0 Dermatologic / Skin Assessment (not related to wound area) ASSESSMENTS - Ostomy and/or Continence Assessment and Care []  - 0 Incontinence Assessment and Management []  - 0 Ostomy Care Assessment and Management (repouching, etc.) PROCESS - Coordination of Care []  - 0 Simple Patient / Family Education for ongoing care X- 1 20 Complex (extensive) Patient / Family Education for ongoing care X- 1 10 Staff obtains Programmer, systems, Records, T Results / Process Orders est X- 1 10 Staff telephones HHA, Nursing Homes / Clarify orders / etc []  - 0 Routine Transfer to another Facility (non-emergent condition) []  - 0 Routine Hospital Admission (non-emergent condition) []  - 0 New Admissions / Biomedical engineer / Ordering NPWT Apligraf, etc. , []  - 0 Emergency Hospital Admission (emergent condition) []  - 0 Simple Discharge Coordination []  - 0 Complex (extensive) Discharge Coordination PROCESS - Special Needs []  - 0 Pediatric / Minor Patient Management []  - 0 Isolation Patient Management []  - 0 Hearing / Language / Visual special needs []  - 0 Assessment of Community assistance (transportation, D/C planning, etc.) []  - 0 Additional assistance / Altered mentation []  - 0 Support Surface(s) Assessment (bed, cushion,  [name], etc.) INTERVENTIONS - Wound Cleansing / Measurement X- 1 5 Wound Imaging (photographs - any number of wounds) []  - 0 Wound Tracing (instead of photographs) []  - 0 Simple Wound Measurement - one wound X- 2 5 Complex Wound Measurement - multiple wounds []  - 0 Simple Wound Cleansing - one wound X-  2 5 Complex Wound Cleansing - multiple wounds INTERVENTIONS - Wound Dressings []  - 0 Small Wound Dressing one or multiple wounds X- 2 15 Medium Wound Dressing one or multiple wounds []  - 0 Large Wound Dressing one or multiple wounds []  - 0 Application of Medications - injection INTERVENTIONS - Miscellaneous []  - 0 External ear exam []  - 0 Specimen Collection (cultures, biopsies, blood, body fluids, etc.) []  - 0 Specimen(s) / Culture(s) sent or taken to Lab for analysis []  - 0 Patient Transfer (multiple staff / Harrel Lemon Lift / Similar devices) []  - 0 Simple Staple / Suture removal (25 or less) []  - 0 Complex Staple / Suture removal (26 or more) []  - 0 Hypo / Hyperglycemic Management (close monitor of Blood Glucose) []  - 0 Ankle / Brachial Index (ABI) - do not check if billed separately Has the patient been seen at the hospital within the last three years: Yes Total Score: 150 Level Of Care: New/Established - Level 4 Electronic Signature(s) Signed: 04/19/2021 5:36:52 PM By: Lorrin Jackson Entered By: Lorrin Jackson on 04/19/2021 14:36:07 -------------------------------------------------------------------------------- Encounter Discharge Information Details Patient Name: Date of Service: CRO Gary [name]. 04/19/2021 1:15 PM Medical Record Number: [id number] Patient Account Number: [id number] Date of Birth/Sex: Treating RN: July 17, 1916 (87 y.o. Gary [name] Primary Care Toleen Lachapelle: Veleta Miners Other Clinician: Referring Casi Westerfeld: Treating Vaeda Westall/Extender: Cleda Daub in Treatment: 0 Encounter Discharge Information Items Discharge Condition:  Stable Ambulatory Status: Wheelchair Discharge Destination: Home Transportation: Private Auto Accompanied By: self Schedule Follow-up Appointment: Yes Clinical Summary of Care: Electronic Signature(s) Signed: 04/19/2021 5:38:02 PM By: Deon Pilling Entered By: Deon Pilling on 04/19/2021 17:33:33 -------------------------------------------------------------------------------- Lower Extremity Assessment Details Patient Name: Date of Service: CRO Gary [name] 04/19/2021 1:15 PM Medical Record Number: [id number] Patient Account Number: [id number] Date of Birth/Sex: Treating RN: [birth year] (87 y.o. Gary [name] Primary Care Swan Zayed: Veleta Miners Other Clinician: Referring Jaqua Ching: Treating Kandyce Dieguez/Extender: Kathie Rhodes Weeks in Treatment: 0 Edema Assessment Assessed: Shirlyn Goltz: Yes] Patrice Paradise: No] Edema: [Left: Ye] [Right: s] Calf Left: Right: Point of Measurement: From Medial Instep 35 cm Ankle Left: Right: Point of Measurement: From Medial Instep 24 cm Vascular Assessment Pulses: Dorsalis Pedis Palpable: [Left:Yes] Posterior Tibial Palpable: [Left:Yes] Electronic Signature(s) Signed: 04/20/2021 5:16:57 PM By: Rhae Hammock RN Entered By: Rhae Hammock on 04/19/2021 14:08:32 -------------------------------------------------------------------------------- Multi Wound Chart Details Patient Name: Date of Service: CRO Gary [name]. 04/19/2021 1:15 PM Medical Record Number: [id number] Patient Account Number: [id number] Date of Birth/Sex: Treating RN: April 27, 1916 (87 y.o. Gary [name] Primary Care Jenevieve Kirschbaum: Veleta Miners Other Clinician: Referring Kamrie Fanton: Treating Azaya Goedde/Extender: Kathie Rhodes Weeks in Treatment: 0 Vital Signs Height(in): Pulse(bpm): 72 Weight(lbs): Blood Pressure(mmHg): 113/63 Body Mass Index(BMI): Temperature(F): 98.3 Respiratory Rate(breaths/min): 17 Photos: [N/A:N/A] Left Calcaneus  Coccyx N/A Wound Location: Gradually Appeared Gradually Appeared N/A Wounding Event: Pressure Ulcer Pressure Ulcer N/A Primary Etiology: Arrhythmia, End Stage Renal Disease Arrhythmia, End Stage Renal Disease N/A Comorbid History: 02/21/2021 04/05/2020 N/A Date Acquired: 0 0 N/A Weeks of Treatment: Open Open N/A Wound Status: 3.4x3.1x0.9 7x6.5x5.2 N/A Measurements L x W x D (cm) 8.278 35.736 N/A A (cm) : rea 7.45 185.825 N/A Volume (cm) : N/A 0.00% N/A % Reduction in A rea: N/A 0.00% N/A % Reduction in Volume: 12 Starting Position 1 (o'clock): 12 Ending Position 1 (o'clock): 4 Maximum Distance 1 (cm): No Yes N/A Undermining: Unstageable/Unclassified Unstageable/Unclassified N/A Classification: Medium Large N/A Exudate A mount: Serosanguineous Serosanguineous N/A Exudate Type:  red, brown red, brown N/A Exudate Color: No Yes N/A Foul Odor A Cleansing: fter N/A No N/A Odor A nticipated Due to Product Use: Distinct, outline attached Distinct, outline attached N/A Wound Margin: None Present (0%) None Present (0%) N/A Granulation A mount: Large (67-100%) Large (67-100%) N/A Necrotic A mount: Eschar, Adherent Slough Eschar, Adherent Slough N/A Necrotic Tissue: Fascia: No Fascia: No N/A Exposed Structures: Fat Layer (Subcutaneous Tissue): No Fat Layer (Subcutaneous Tissue): No Tendon: No Tendon: No Muscle: No Muscle: No Joint: No Joint: No Bone: No Bone: No None None N/A Epithelialization: Treatment Notes Wound #1 (Calcaneus) Wound Laterality: Left Cleanser Soap and Water Discharge Instruction: May shower and wash wound with dial antibacterial soap and water prior to dressing change. Wound Cleanser Discharge Instruction: Cleanse the wound with wound cleanser prior to applying a clean dressing using gauze sponges, not tissue or cotton balls. Peri-Wound Care Topical Primary Dressing Santyl Ointment Discharge Instruction: Apply nickel thick amount  to wound bed as instructed Secondary Dressing Woven Gauze Sponge, Non-Sterile 4x4 in Discharge Instruction: Apply over primary dressing as directed. ABD Pad, 5x9 Discharge Instruction: Apply over primary dressing as directed. Secured With [employer], 4.5x3.1 (in/yd) Discharge Instruction: Secure with Kerlix as directed. Transpore Surgical Tape, 2x10 (in/yd) Discharge Instruction: Secure dressing with tape as directed. Compression Wrap Compression Stockings Add-Ons Wound #2 (Coccyx) Cleanser Soap and Water Discharge Instruction: May shower and wash wound with dial antibacterial soap and water prior to dressing change. Wound Cleanser Discharge Instruction: Cleanse the wound with wound cleanser prior to applying a clean dressing using gauze sponges, not tissue or cotton balls. Peri-Wound Care Topical Primary Dressing Secondary Dressing Woven Gauze Sponge, Non-Sterile 4x4 in Discharge Instruction: Apply Dakin's moistened gauze to wound bed ABD Pad, 8x10 Discharge Instruction: Apply over primary dressing as directed. Secured With 46M Medipore H Soft Cloth Surgical T 4 x 2 (in/yd) ape Discharge Instruction: Secure dressing with tape as directed. Compression Wrap Compression Stockings Add-Ons Electronic Signature(s) Signed: 04/22/2021 12:53:54 PM By: Kalman Shan DO Signed: 04/22/2021 5:51:06 PM By: Lorrin Jackson Entered By: Kalman Shan on 04/22/2021 09:47:35 -------------------------------------------------------------------------------- Multi-Disciplinary Care Plan Details Patient Name: Date of Service: [name], [name] 04/19/2021 1:15 PM Medical Record Number: [id number] Patient Account Number: [id number] Date of Birth/Sex: Treating RN: 06-09-1916 (87 y.o. Gary [name] Primary Care Hai Grabe: Veleta Miners Other Clinician: Referring Naira Standiford: Treating Charolette Bultman/Extender: Kathie Rhodes Weeks in Treatment: 0 Active  Inactive Electronic Signature(s) Signed: 05/03/2021 4:01:42 PM By: Lorrin Jackson Previous Signature: 04/19/2021 2:04:50 PM Version By: Lorrin Jackson Entered By: Lorrin Jackson on 05/03/2021 16:01:42 -------------------------------------------------------------------------------- Pain Assessment Details Patient Name: Date of Service: CRO Gary [name]. 04/19/2021 1:15 PM Medical Record Number: [id number] Patient Account Number: [id number] Date of Birth/Sex: Treating RN: 1916/03/29 (87 y.o. Gary [name] Primary Care Keilan Nichol: Veleta Miners Other Clinician: Referring Collin Rengel: Treating Tanica Gaige/Extender: Kathie Rhodes Weeks in Treatment: 0 Active Problems Location of Pain Severity and Description of Pain Patient Has Paino No Site Locations Pain Management and Medication Current Pain Management: Electronic Signature(s) Signed: 04/20/2021 5:16:57 PM By: Rhae Hammock RN Entered By: Rhae Hammock on 04/19/2021 13:18:12 -------------------------------------------------------------------------------- Patient/Caregiver Education Details Patient Name: Date of Service: CRO Gary [name] 8/15/2022andnbsp1:15 PM Medical Record Number: [id number] Patient Account Number: [id number] Date of Birth/Gender: Treating RN: Jan 25, 1916 (87 y.o. Gary [name] Primary Care Physician: Veleta Miners Other Clinician: Referring Physician: Treating Physician/Extender: Cleda Daub in Treatment: 0 Education Assessment Education Provided To: Patient  and Caregiver Education Topics Provided Nutrition: Methods: Explain/Verbal, Printed Responses: State content correctly Pressure: Methods: Explain/Verbal, Printed Responses: State content correctly Wound/Skin Impairment: Methods: Explain/Verbal, Printed Responses: State content correctly Electronic Signature(s) Signed: 04/19/2021 5:36:52 PM By: Lorrin Jackson Entered By: Lorrin Jackson on  04/19/2021 14:05:14 -------------------------------------------------------------------------------- Wound Assessment Details Patient Name: Date of Service: CRO Gary [name] 04/19/2021 1:15 PM Medical Record Number: [id number] Patient Account Number: [id number] Date of Birth/Sex: Treating RN: [birth year] (87 y.o. Gary [name] Primary Care Joshwa Hemric: Veleta Miners Other Clinician: Referring Audon Heymann: Treating Dejae Bernet/Extender: Kathie Rhodes Weeks in Treatment: 0 Wound Status Wound Number: 1 Primary Etiology: Pressure Ulcer Wound Location: Left Calcaneus Wound Status: Open Wounding Event: Gradually Appeared Comorbid History: Arrhythmia, End Stage Renal Disease Date Acquired: 02/21/2021 Weeks Of Treatment: 0 Clustered Wound: No Photos Wound Measurements Length: (cm) 3.4 Width: (cm) 3.1 Depth: (cm) 0.9 Area: (cm) 8.278 Volume: (cm) 7.45 % Reduction in Area: % Reduction in Volume: Epithelialization: None Tunneling: No Undermining: No Wound Description Classification: Unstageable/Unclassified Wound Margin: Distinct, outline attached Exudate Amount: Medium Exudate Type: Serosanguineous Exudate Color: red, brown Foul Odor After Cleansing: No Slough/Fibrino Yes Wound Bed Granulation Amount: None Present (0%) Exposed Structure Necrotic Amount: Large (67-100%) Fascia Exposed: No Necrotic Quality: Eschar, Adherent Slough Fat Layer (Subcutaneous Tissue) Exposed: No Tendon Exposed: No Muscle Exposed: No Joint Exposed: No Bone Exposed: No Electronic Signature(s) Signed: 04/20/2021 5:16:57 PM By: Rhae Hammock RN Entered By: Rhae Hammock on 04/19/2021 13:53:27 -------------------------------------------------------------------------------- Wound Assessment Details Patient Name: Date of Service: CRO Gary [name]. 04/19/2021 1:15 PM Medical Record Number: [id number] Patient Account Number: [id number] Date of Birth/Sex: Treating  RN: Apr 10, 1916 (87 y.o. Gary [name] Primary Care Selso Mannor: Veleta Miners Other Clinician: Referring Junita Kubota: Treating Jerzey Komperda/Extender: Kathie Rhodes Weeks in Treatment: 0 Wound Status Wound Number: 2 Primary Etiology: Pressure Ulcer Wound Location: Coccyx Wound Status: Open Wounding Event: Gradually Appeared Comorbid History: Arrhythmia, End Stage Renal Disease Date Acquired: 04/05/2020 Weeks Of Treatment: 0 Clustered Wound: No Photos Wound Measurements Length: (cm) 7 Width: (cm) 6.5 Depth: (cm) 5.2 Area: (cm) 35.736 Volume: (cm) 185.825 % Reduction in Area: 0% % Reduction in Volume: 0% Epithelialization: None Tunneling: No Undermining: Yes Starting Position (o'clock): 12 Ending Position (o'clock): 12 Maximum Distance: (cm) 4 Wound Description Classification: Unstageable/Unclassified Wound Margin: Distinct, outline attached Exudate Amount: Large Exudate Type: Serosanguineous Exudate Color: red, brown Foul Odor After Cleansing: Yes Due to Product Use: No Slough/Fibrino Yes Wound Bed Granulation Amount: None Present (0%) Exposed Structure Necrotic Amount: Large (67-100%) Fascia Exposed: No Necrotic Quality: Eschar, Adherent Slough Fat Layer (Subcutaneous Tissue) Exposed: No Tendon Exposed: No Muscle Exposed: No Joint Exposed: No Bone Exposed: No Electronic Signature(s) Signed: 04/20/2021 5:16:57 PM By: Rhae Hammock RN Entered By: Rhae Hammock on 04/19/2021 14:08:05 -------------------------------------------------------------------------------- [place] Details Patient Name: Date of Service: [name], [name] 04/19/2021 1:15 PM Medical Record Number: [id number] Patient Account Number: [id number] Date of Birth/Sex: Treating RN: Jan 17, 1916 (87 y.o. Gary [name] Primary Care Deriyah Kunath: Veleta Miners Other Clinician: Referring Percy Winterrowd: Treating Raford Brissett/Extender: Kathie Rhodes Weeks in  Treatment: 0 Vital Signs Time Taken: 13:45 Temperature (F): 98.3 Pulse (bpm): 72 Respiratory Rate (breaths/min): 17 Blood Pressure (mmHg): 113/63 Reference Range: 80 - 120 mg / dl Electronic Signature(s) Signed: 04/20/2021 5:16:57 PM By: Rhae Hammock RN Entered By: Rhae Hammock on 04/19/2021 13:48:20

## 2021-04-20 NOTE — Progress Notes (Signed)
AASIR, DAIGLER ([id number]) Visit Report for 04/19/2021 Abuse/Suicide Risk Screen Details Patient Name: Date of Service: [name] 04/19/2021 1:15 PM Medical Record Number: [id number] Patient Account Number: [id number] Date of Birth/Sex: Treating RN: August 15, 1916 (87 y.o. [name], [name] Primary Care Glennie Rodda: Veleta Miners Other Clinician: Referring Mack Alvidrez: Treating Ugonna Keirsey/Extender: Kathie Rhodes Weeks in Treatment: 0 Abuse/Suicide Risk Screen Items Answer ABUSE RISK SCREEN: Has anyone close to you tried to hurt or harm you recentlyo No Do you feel uncomfortable with anyone in your familyo No Has anyone forced you do things that you didnt want to doo No Electronic Signature(s) Signed: 04/20/2021 5:16:57 PM By: Rhae Hammock RN Entered By: Rhae Hammock on 04/19/2021 13:16:55 -------------------------------------------------------------------------------- Activities of Daily Living Details Patient Name: Date of Service: [name] 04/19/2021 1:15 PM Medical Record Number: [id number] Patient Account Number: [id number] Date of Birth/Sex: Treating RN: [birth year] (87 y.o. [name], [name] Primary Care Kaysin Brock: Veleta Miners Other Clinician: Referring Sher Shampine: Treating Jurrell Royster/Extender: Kathie Rhodes Weeks in Treatment: 0 Activities of Daily Living Items Answer Activities of Daily Living (Please select one for each item) Drive Automobile Not Able T Medications ake Need Assistance Use T elephone Need Assistance Care for Appearance Need Assistance Use T oilet Need Assistance Bath / Shower Need Assistance Dress Self Need Assistance Feed Self Need Assistance Walk Need Assistance Get In / Out Bed Need Assistance Housework Need Assistance Prepare Meals Need Assistance Handle Money Need Assistance Shop for Self Need Assistance Electronic Signature(s) Signed: 04/20/2021 5:16:57 PM By: Rhae Hammock  RN Entered By: Rhae Hammock on 04/19/2021 13:17:15 -------------------------------------------------------------------------------- Education Screening Details Patient Name: Date of Service: [name]. 04/19/2021 1:15 PM Medical Record Number: [id number] Patient Account Number: [id number] Date of Birth/Sex: Treating RN: Oct 30, 1915 (87 y.o. [name], [name] Primary Care Lockie Bothun: Veleta Miners Other Clinician: Referring Leighanne Adolph: Treating Yoshino Broccoli/Extender: Cleda Daub in Treatment: 0 Primary Learner Assessed: Patient Learning Preferences/Education Level/Primary Language Learning Preference: Explanation, Demonstration, Communication Board, Printed Material Highest Education Level: High School Preferred Language: English Cognitive Barrier Language Barrier: No Translator Needed: No Memory Deficit: No Emotional Barrier: No Cultural/Religious Beliefs Affecting Medical Care: No Physical Barrier Impaired Vision: Yes Glasses Impaired Hearing: Yes Decreased Hand dexterity: No Knowledge/Comprehension Knowledge Level: High Comprehension Level: High Ability to understand written instructions: High Ability to understand verbal instructions: High Motivation Anxiety Level: Calm Cooperation: Cooperative Education Importance: Denies Need Interest in Health Problems: Asks Questions Perception: Coherent Willingness to Engage in Self-Management High Activities: Readiness to Engage in Self-Management High Activities: Electronic Signature(s) Signed: 04/20/2021 5:16:57 PM By: Rhae Hammock RN Entered By: Rhae Hammock on 04/19/2021 13:17:54 -------------------------------------------------------------------------------- Fall Risk Assessment Details Patient Name: Date of Service: [name], [name] 04/19/2021 1:15 PM Medical Record Number: [id number] Patient Account Number: [id number] Date of Birth/Sex: Treating RN: 09-Aug-1916 (87 y.o.  [name], [name] Primary Care Sherryann Frese: Veleta Miners Other Clinician: Referring Ilario Dhaliwal: Treating Mackensi Mahadeo/Extender: Kathie Rhodes Weeks in Treatment: 0 Fall Risk Assessment Items Have you had 2 or more falls in the last 12 monthso 0 No Have you had any fall that resulted in injury in the last 12 monthso 0 No FALLS RISK SCREEN History of falling - immediate or within 3 months 0 No Secondary diagnosis (Do you have 2 or more medical diagnoseso) 0 No Ambulatory aid None/bed rest/wheelchair/nurse 0 No Crutches/cane/walker 0 No Furniture 0 No Intravenous therapy Access/Saline/Heparin Lock 0 No Gait/Transferring Normal/ bed rest/ wheelchair 0 No  Weak (short steps with or without shuffle, stooped but able to lift head while walking, may seek 0 No support from furniture) Impaired (short steps with shuffle, may have difficulty arising from chair, head down, impaired 0 No balance) Mental Status Oriented to own ability 0 No Electronic Signature(s) Signed: 04/20/2021 5:16:57 PM By: Rhae Hammock RN Entered By: Rhae Hammock on 04/19/2021 13:17:59 -------------------------------------------------------------------------------- Foot Assessment Details Patient Name: Date of Service: [name]. 04/19/2021 1:15 PM Medical Record Number: [id number] Patient Account Number: [id number] Date of Birth/Sex: Treating RN: October 16, 1915 (87 y.o. [name], [name] Primary Care Vong Garringer: Veleta Miners Other Clinician: Referring Sidnee Gambrill: Treating Kaleisha Bhargava/Extender: Kathie Rhodes Weeks in Treatment: 0 Foot Assessment Items Site Locations + = Sensation present, - = Sensation absent, C = Callus, U = Ulcer R = Redness, W = Warmth, M = Maceration, PU = Pre-ulcerative lesion F = Fissure, S = Swelling, D = Dryness Assessment Right: Left: Other Deformity: No No Prior Foot Ulcer: No No Prior Amputation: No No Charcot Joint: No No Ambulatory  Status: Non-ambulatory Assistance Device: Wheelchair Gait: Administrator, arts) Signed: 04/20/2021 5:16:57 PM By: Rhae Hammock RN Entered By: Rhae Hammock on 04/19/2021 14:07:54 -------------------------------------------------------------------------------- Nutrition Risk Screening Details Patient Name: Date of Service: [name] 04/19/2021 1:15 PM Medical Record Number: [id number] Patient Account Number: [id number] Date of Birth/Sex: Treating RN: [birth year] (87 y.o. [name], [name] Primary Care Shyanne Mcclary: Veleta Miners Other Clinician: Referring Dangelo Guzzetta: Treating Amara Justen/Extender: Kathie Rhodes Weeks in Treatment: 0 Height (in): Weight (lbs): Body Mass Index (BMI): Nutrition Risk Screening Items Score Screening NUTRITION RISK SCREEN: I have an illness or condition that made me change the kind and/or amount of food I eat 0 No I eat fewer than two meals per day 0 No I eat few fruits and vegetables, or milk products 0 No I have three or more drinks of beer, liquor or wine almost every day 0 No I have tooth or mouth problems that make it hard for me to eat 0 No I don't always have enough money to buy the food I need 0 No I eat alone most of the time 0 No I take three or more different prescribed or over-the-counter drugs a day 0 No Without wanting to, I have lost or gained 10 pounds in the last six months 0 No I am not always physically able to shop, cook and/or feed myself 0 No Nutrition Protocols Good Risk Protocol 0 No interventions needed Moderate Risk Protocol High Risk Proctocol Risk Level: Good Risk Score: 0 Electronic Signature(s) Signed: 04/20/2021 5:16:57 PM By: Rhae Hammock RN Entered By: Rhae Hammock on 04/19/2021 13:18:03

## 2021-04-22 NOTE — Progress Notes (Signed)
Gary [name] ([id number]) Visit Report for 04/19/2021 Chief Complaint Document Details Patient Name: Date of Service: Gary [name] 04/19/2021 1:15 PM Medical Record Number: [id number] Patient Account Number: [id number] Date of Birth/Sex: Treating RN: 1916/02/13 (87 y.o. [name] Primary Care Provider: Veleta Miners Other Clinician: Referring Provider: Treating Provider/Extender: Kathie Rhodes Weeks in Treatment: 0 Information Obtained from: Patient Chief Complaint Left heel and back wound Electronic Signature(s) Signed: 04/22/2021 12:53:54 PM By: Kalman Shan DO Entered By: Kalman Shan on 04/22/2021 09:47:41 -------------------------------------------------------------------------------- HPI Details Patient Name: Date of Service: Gary Gary [name]. 04/19/2021 1:15 PM Medical Record Number: [id number] Patient Account Number: [id number] Date of Birth/Sex: Treating RN: 16-Jun-1916 (87 y.o. [name] Primary Care Provider: Veleta Miners Other Clinician: Referring Provider: Treating Provider/Extender: Kathie Rhodes Weeks in Treatment: 0 History of Present Illness HPI Description: Admission 8/18 [name] is 87 year old male with a past medical history of chronic A. fib, dementia and moderate protein calorie malnutrition that presents to the clinic for 2 wounds. Patient's son is present and provides the history. The wound is located to his back/coccyx area and has been present for 1 to 2 years. The wound to his left heel has been present for the past 2 months. For the back they have been using wet-to-dry dressings. For the heel wound santyl and silver alginate. They reported no signs or symptoms of infection. Electronic Signature(s) Signed: 04/22/2021 12:53:54 PM By: Kalman Shan DO Entered By: Kalman Shan on 04/22/2021  12:48:08 -------------------------------------------------------------------------------- Physical Exam Details Patient Name: Date of Service: Gary [name]. 04/19/2021 1:15 PM Medical Record Number: [id number] Patient Account Number: [id number] Date of Birth/Sex: Treating RN: 1916/01/30 (87 y.o. [name] Primary Care Provider: Veleta Miners Other Clinician: Referring Provider: Treating Provider/Extender: Kathie Rhodes Weeks in Treatment: 0 Constitutional respirations regular, non-labored and within target range for patient.Marland Kitchen Psychiatric pleasant and cooperative. Notes Lower back: Right above the coccyx area there is a large open wound with nonviable tissue throughout with scant granulation tissue present. Left heel: Eschar present with no obvious signs of infection. Electronic Signature(s) Signed: 04/22/2021 12:53:54 PM By: Kalman Shan DO Entered By: Kalman Shan on 04/22/2021 12:48:48 -------------------------------------------------------------------------------- Physician Orders Details Patient Name: Date of Service: Gary [name]. 04/19/2021 1:15 PM Medical Record Number: [id number] Patient Account Number: [id number] Date of Birth/Sex: Treating RN: 10/12/1915 (87 y.o. [name] Primary Care Provider: Veleta Miners Other Clinician: Referring Provider: Treating Provider/Extender: Cleda Daub in Treatment: 0 Verbal / Phone Orders: No Diagnosis Coding ICD-10 Coding Code Description [phone] Pressure ulcer of left heel, unstageable L89.100 Pressure ulcer of unspecified part of back, unstageable I48.20 Chronic atrial fibrillation, unspecified F03.90 Unspecified dementia without behavioral disturbance Follow-up Appointments Return appointment in 1 month. - with Dr. Heber [place] Other: - Family has upcoming meeting with Palliative Care Bathing/ Shower/ Hygiene May shower and wash wound with soap and water.  - When dressing being changed. Off-Loading Heel suspension boot to: - Bunny Boots-Elevate heels to relieve pressiure. Low air-loss mattress (Group 2) Turn and reposition every 2 hours Additional Orders / Instructions Follow Nutritious Diet - 100-120g protein daily Wound Treatment Wound #1 - Calcaneus Wound Laterality: Left Cleanser: Soap and Water 1 x Per Day/30 Days Discharge Instructions: May shower and wash wound with dial antibacterial soap and water prior to dressing change. Cleanser: Wound Cleanser 1 x Per Day/30 Days Discharge Instructions: Cleanse the wound with wound cleanser prior  to applying a clean dressing using gauze sponges, not tissue or cotton balls. Prim Dressing: Santyl Ointment 1 x Per Day/30 Days ary Discharge Instructions: Apply nickel thick amount to wound bed as instructed Secondary Dressing: Woven Gauze Sponge, Non-Sterile 4x4 in 1 x Per Day/30 Days Discharge Instructions: Apply over primary dressing as directed. Secondary Dressing: ABD Pad, 5x9 1 x Per Day/30 Days Discharge Instructions: Apply over primary dressing as directed. Secured With: [employer], 4.5x3.1 (in/yd) 1 x Per Day/30 Days Discharge Instructions: Secure with Kerlix as directed. Secured With: Transpore Surgical Tape, 2x10 (in/yd) 1 x Per Day/30 Days Discharge Instructions: Secure dressing with tape as directed. Wound #2 - Coccyx Cleanser: Soap and Water 1 x Per Day/30 Days Discharge Instructions: May shower and wash wound with dial antibacterial soap and water prior to dressing change. Cleanser: Wound Cleanser 1 x Per Day/30 Days Discharge Instructions: Cleanse the wound with wound cleanser prior to applying a clean dressing using gauze sponges, not tissue or cotton balls. Secondary Dressing: Woven Gauze Sponge, Non-Sterile 4x4 in 1 x Per Day/30 Days Discharge Instructions: Apply Dakin's moistened gauze to wound bed Secondary Dressing: ABD Pad, 8x10 1 x Per Day/30 Days Discharge  Instructions: Apply over primary dressing as directed. Secured With: 48M Medipore H Soft Cloth Surgical T 4 x 2 (in/yd) 1 x Per Day/30 Days ape Discharge Instructions: Secure dressing with tape as directed. Electronic Signature(s) Signed: 04/22/2021 12:53:54 PM By: Kalman Shan DO Previous Signature: 04/19/2021 5:36:52 PM Version By: Lorrin Jackson Entered By: Kalman Shan on 04/22/2021 12:49:26 -------------------------------------------------------------------------------- Problem List Details Patient Name: Date of Service: Gary Gary [name]. 04/19/2021 1:15 PM Medical Record Number: [id number] Patient Account Number: [id number] Date of Birth/Sex: Treating RN: 1916-07-06 (87 y.o. [name] Primary Care Provider: Veleta Miners Other Clinician: Referring Provider: Treating Provider/Extender: Kathie Rhodes Weeks in Treatment: 0 Active Problems ICD-10 Encounter Code Description Active Date MDM Diagnosis L89.620 Pressure ulcer of left heel, unstageable 04/19/2021 No Yes L89.100 Pressure ulcer of unspecified part of back, unstageable 04/19/2021 No Yes I48.20 Chronic atrial fibrillation, unspecified 04/19/2021 No Yes F03.90 Unspecified dementia without behavioral disturbance 04/19/2021 No Yes Inactive Problems Resolved Problems Electronic Signature(s) Signed: 04/22/2021 12:53:54 PM By: Kalman Shan DO Entered By: Kalman Shan on 04/22/2021 09:46:07 -------------------------------------------------------------------------------- Progress Note Details Patient Name: Date of Service: Gary [name]. 04/19/2021 1:15 PM Medical Record Number: [id number] Patient Account Number: [id number] Date of Birth/Sex: Treating RN: 1916/04/02 (87 y.o. [name] Primary Care Provider: Veleta Miners Other Clinician: Referring Provider: Treating Provider/Extender: Kathie Rhodes Weeks in Treatment: 0 Subjective Chief  Complaint Information obtained from Patient Left heel and back wound History of Present Illness (HPI) Admission 8/18 [name] is 87 year old male with a past medical history of chronic A. fib, dementia and moderate protein calorie malnutrition that presents to the clinic for 2 wounds. Patient's son is present and provides the history. The wound is located to his back/coccyx area and has been present for 1 to 2 years. The wound to his left heel has been present for the past 2 months. For the back they have been using wet-to-dry dressings. For the heel wound santyl and silver alginate. They reported no signs or symptoms of infection. Patient History Information obtained from Patient. Allergies pepper (genus Capsicum), Plavix Family History Unknown History. Social History Never smoker, Marital Status - Single, Alcohol Use - Never, Drug Use - No History, Caffeine Use - Rarely. Medical History Eyes Denies history  of Cataracts, Glaucoma, Optic Neuritis Ear/Nose/Mouth/Throat Denies history of Chronic sinus problems/congestion, Middle ear problems Hematologic/Lymphatic Denies history of Anemia, Hemophilia, Human Immunodeficiency Virus, Lymphedema, Sickle Cell Disease Respiratory Denies history of Aspiration, Asthma, Chronic Obstructive Pulmonary Disease (COPD), Pneumothorax, Sleep Apnea, Tuberculosis Cardiovascular Patient has history of Arrhythmia - Afibb Denies history of Angina, Congestive Heart Failure Gastrointestinal Denies history of Cirrhosis , Colitis, Crohnoos, Hepatitis A, Hepatitis B, Hepatitis C Endocrine Denies history of Type I Diabetes, Type II Diabetes Genitourinary Patient has history of End Stage Renal Disease Immunological Denies history of Lupus Erythematosus, Raynaudoos, Scleroderma Integumentary (Skin) Denies history of History of Burn Musculoskeletal Denies history of Gout, Rheumatoid Arthritis, Osteoarthritis, Osteomyelitis Neurologic Denies  history of Dementia, Neuropathy, Quadriplegia, Paraplegia, Seizure Disorder Medical A Surgical History Notes nd Cardiovascular cardiac murmurs Gastrointestinal reflux esophagitis Genitourinary chronic kidneyh disease stage 3, hypertrophy of prostate Review of Systems (ROS) Constitutional Symptoms (General Health) Denies complaints or symptoms of Fatigue, Fever, Chills, Marked Weight Change. Eyes Denies complaints or symptoms of Dry Eyes, Vision Changes, Glasses / Contacts. Ear/Nose/Mouth/Throat Denies complaints or symptoms of Chronic sinus problems or rhinitis. Respiratory Denies complaints or symptoms of Chronic or frequent coughs, Shortness of Breath. Cardiovascular Denies complaints or symptoms of Chest pain. Gastrointestinal Denies complaints or symptoms of Frequent diarrhea, Nausea, Vomiting. Endocrine Denies complaints or symptoms of Heat/cold intolerance. Genitourinary Denies complaints or symptoms of Frequent urination. Integumentary (Skin) Complains or has symptoms of Wounds. Musculoskeletal Denies complaints or symptoms of Muscle Pain, Muscle Weakness. Neurologic Denies complaints or symptoms of Numbness/parasthesias. Psychiatric Denies complaints or symptoms of Claustrophobia, Suicidal. Objective Constitutional respirations regular, non-labored and within target range for patient.. Vitals Time Taken: 1:45 PM, Temperature: 98.3 F, Pulse: 72 bpm, Respiratory Rate: 17 breaths/min, Blood Pressure: 113/63 mmHg. Psychiatric pleasant and cooperative. General Notes: Lower back: Right above the coccyx area there is a large open wound with nonviable tissue throughout with scant granulation tissue present. Left heel: Eschar present with no obvious signs of infection. Integumentary (Hair, Skin) Wound #1 status is Open. Original cause of wound was Gradually Appeared. The date acquired was: 02/21/2021. The wound is located on the Left Calcaneus. The wound measures 3.4cm  length x 3.1cm width x 0.9cm depth; 8.278cm^2 area and 7.45cm^3 volume. There is no tunneling or undermining noted. There is a medium amount of serosanguineous drainage noted. The wound margin is distinct with the outline attached to the wound base. There is no granulation within the wound bed. There is a large (67-100%) amount of necrotic tissue within the wound bed including Eschar and Adherent Slough. Wound #2 status is Open. Original cause of wound was Gradually Appeared. The date acquired was: 04/05/2020. The wound is located on the Coccyx. The wound measures 7cm length x 6.5cm width x 5.2cm depth; 35.736cm^2 area and 185.825cm^3 volume. There is no tunneling noted, however, there is undermining starting at 12:00 and ending at 12:00 with a maximum distance of 4cm. There is a large amount of serosanguineous drainage noted. Foul odor after cleansing was noted. The wound margin is distinct with the outline attached to the wound base. There is no granulation within the wound bed. There is a large (67-100%) amount of necrotic tissue within the wound bed including Eschar and Adherent Slough. Assessment Active Problems ICD-10 Pressure ulcer of left heel, unstageable Pressure ulcer of unspecified part of back, unstageable Chronic atrial fibrillation, unspecified Unspecified dementia without behavioral disturbance Patient is 87 year old male with dementia who presents for chronic nonhealing wounds to his lower back and heel.  Patient's son and I had a long discussion about different treatment options including debridements with imaging versus conservative wet-to-dry dressings and Santyl. Patient's son would like to take a more conservative approach given the patient's age and comorbidities. I think this is an appropriate treatment option. I recommended Dakin's wet-to-dry for the lower back and Santyl to the eschared heel wound. He is also meeting with palliative care in the next week. I think at this  time it would be appropriate to follow-up in 1 month to reassess. Plan Follow-up Appointments: Return appointment in 1 month. - with Dr. Heber [place] Other: - Family has upcoming meeting with Palliative Care Bathing/ Shower/ Hygiene: May shower and wash wound with soap and water. - When dressing being changed. Off-Loading: Heel suspension boot to: - Bunny Boots-Elevate heels to relieve pressiure. Low air-loss mattress (Group 2) Turn and reposition every 2 hours Additional Orders / Instructions: Follow Nutritious Diet - 100-120g protein daily WOUND #1: - Calcaneus Wound Laterality: Left Cleanser: Soap and Water 1 x Per Day/30 Days Discharge Instructions: May shower and wash wound with dial antibacterial soap and water prior to dressing change. Cleanser: Wound Cleanser 1 x Per Day/30 Days Discharge Instructions: Cleanse the wound with wound cleanser prior to applying a clean dressing using gauze sponges, not tissue or cotton balls. Prim Dressing: Santyl Ointment 1 x Per Day/30 Days ary Discharge Instructions: Apply nickel thick amount to wound bed as instructed Secondary Dressing: Woven Gauze Sponge, Non-Sterile 4x4 in 1 x Per Day/30 Days Discharge Instructions: Apply over primary dressing as directed. Secondary Dressing: ABD Pad, 5x9 1 x Per Day/30 Days Discharge Instructions: Apply over primary dressing as directed. Secured With: [employer], 4.5x3.1 (in/yd) 1 x Per Day/30 Days Discharge Instructions: Secure with Kerlix as directed. Secured With: Transpore Surgical T ape, 2x10 (in/yd) 1 x Per Day/30 Days Discharge Instructions: Secure dressing with tape as directed. WOUND #2: - Coccyx Wound Laterality: Cleanser: Soap and Water 1 x Per Day/30 Days Discharge Instructions: May shower and wash wound with dial antibacterial soap and water prior to dressing change. Cleanser: Wound Cleanser 1 x Per Day/30 Days Discharge Instructions: Cleanse the wound with wound cleanser prior to  applying a clean dressing using gauze sponges, not tissue or cotton balls. Secondary Dressing: Woven Gauze Sponge, Non-Sterile 4x4 in 1 x Per Day/30 Days Discharge Instructions: Apply Dakin's moistened gauze to wound bed Secondary Dressing: ABD Pad, 8x10 1 x Per Day/30 Days Discharge Instructions: Apply over primary dressing as directed. Secured With: 33M Medipore H Soft Cloth Surgical T 4 x 2 (in/yd) 1 x Per Day/30 Days ape Discharge Instructions: Secure dressing with tape as directed. 1. Dakin's wet-to-dry to the back wound and Santyl to the left heel wound 2. Follow-up in 1 month Electronic Signature(s) Signed: 04/22/2021 12:53:54 PM By: Kalman Shan DO Entered By: Kalman Shan on 04/22/2021 12:51:58 -------------------------------------------------------------------------------- HxROS Details Patient Name: Date of Service: Gary Gary [name]. 04/19/2021 1:15 PM Medical Record Number: [id number] Patient Account Number: [id number] Date of Birth/Sex: Treating RN: 1915-09-09 (87 y.o. [name], [name] Primary Care Provider: Veleta Miners Other Clinician: Referring Provider: Treating Provider/Extender: Kathie Rhodes Weeks in Treatment: 0 Information Obtained From Patient Constitutional Symptoms (General Health) Complaints and Symptoms: Negative for: Fatigue; Fever; Chills; Marked Weight Change Eyes Complaints and Symptoms: Negative for: Dry Eyes; Vision Changes; Glasses / Contacts Medical History: Negative for: Cataracts; Glaucoma; Optic Neuritis Ear/Nose/Mouth/Throat Complaints and Symptoms: Negative for: Chronic sinus problems or rhinitis Medical History: Negative for: Chronic  sinus problems/congestion; Middle ear problems Respiratory Complaints and Symptoms: Negative for: Chronic or frequent coughs; Shortness of Breath Medical History: Negative for: Aspiration; Asthma; Chronic Obstructive Pulmonary Disease (COPD); Pneumothorax; Sleep Apnea;  Tuberculosis Cardiovascular Complaints and Symptoms: Negative for: Chest pain Medical History: Positive for: Arrhythmia - Afibb Negative for: Angina; Congestive Heart Failure Past Medical History Notes: cardiac murmurs Gastrointestinal Complaints and Symptoms: Negative for: Frequent diarrhea; Nausea; Vomiting Medical History: Negative for: Cirrhosis ; Colitis; Crohns; Hepatitis A; Hepatitis B; Hepatitis C Past Medical History Notes: reflux esophagitis Endocrine Complaints and Symptoms: Negative for: Heat/cold intolerance Medical History: Negative for: Type I Diabetes; Type II Diabetes Genitourinary Complaints and Symptoms: Negative for: Frequent urination Medical History: Positive for: End Stage Renal Disease Past Medical History Notes: chronic kidneyh disease stage 3, hypertrophy of prostate Integumentary (Skin) Complaints and Symptoms: Positive for: Wounds Medical History: Negative for: History of Burn Musculoskeletal Complaints and Symptoms: Negative for: Muscle Pain; Muscle Weakness Medical History: Negative for: Gout; Rheumatoid Arthritis; Osteoarthritis; Osteomyelitis Neurologic Complaints and Symptoms: Negative for: Numbness/parasthesias Medical History: Negative for: Dementia; Neuropathy; Quadriplegia; Paraplegia; Seizure Disorder Psychiatric Complaints and Symptoms: Negative for: Claustrophobia; Suicidal Hematologic/Lymphatic Medical History: Negative for: Anemia; Hemophilia; Human Immunodeficiency Virus; Lymphedema; Sickle Cell Disease Immunological Medical History: Negative for: Lupus Erythematosus; Raynauds; Scleroderma Oncologic Immunizations Pneumococcal Vaccine: Received Pneumococcal Vaccination: Yes Received Pneumococcal Vaccination On or After 60th Birthday: Yes Implantable Devices None Family and Social History Unknown History: Yes; Never smoker; Marital Status - Single; Alcohol Use: Never; Drug Use: No History; Caffeine Use: Rarely;  Financial Concerns: No; Food, Clothing or Shelter Needs: No; Support System Lacking: No; Transportation Concerns: No Electronic Signature(s) Signed: 04/20/2021 5:16:57 PM By: Rhae Hammock RN Signed: 04/22/2021 12:53:54 PM By: Kalman Shan DO Entered By: Rhae Hammock on 04/19/2021 13:16:39 -------------------------------------------------------------------------------- SuperBill Details Patient Name: Date of Service: Gary Gary [name]. 04/19/2021 Medical Record Number: [id number] Patient Account Number: [id number] Date of Birth/Sex: Treating RN: 1916-05-10 (87 y.o. [name] Primary Care Provider: Veleta Miners Other Clinician: Referring Provider: Treating Provider/Extender: Kathie Rhodes Weeks in Treatment: 0 Diagnosis Coding ICD-10 Codes Code Description [phone] Pressure ulcer of left heel, unstageable L89.100 Pressure ulcer of unspecified part of back, unstageable I48.20 Chronic atrial fibrillation, unspecified F03.90 Unspecified dementia without behavioral disturbance Facility Procedures CPT4 Code: [id number] Description: 9214 - WOUND CARE VISIT-LEV 4 EST PT Modifier: 25 Quantity: 1 Physician Procedures : CPT4 Code Description Modifier [id number] WC PHYS LEVEL 3 NEW PT ICD-10 Diagnosis Description L89.620 Pressure ulcer of left heel, unstageable L89.100 Pressure ulcer of unspecified part of back, unstageable I48.20 Chronic atrial fibrillation, unspecified  F03.90 Unspecified dementia without behavioral disturbance Quantity: 1 Electronic Signature(s) Signed: 04/22/2021 12:53:54 PM By: Kalman Shan DO Previous Signature: 04/19/2021 5:36:52 PM Version By: Lorrin Jackson Previous Signature: 04/19/2021 5:36:52 PM Version By: Lorrin Jackson Entered By: Kalman Shan on 04/22/2021 12:52:20

## 2021-04-26 ENCOUNTER — Non-Acute Institutional Stay (SKILLED_NURSING_FACILITY): Payer: Medicare Other | Admitting: Internal Medicine

## 2021-04-26 ENCOUNTER — Encounter: Payer: Self-pay | Admitting: Internal Medicine

## 2021-04-26 DIAGNOSIS — N1832 Chronic kidney disease, stage 3b: Secondary | ICD-10-CM | POA: Diagnosis not present

## 2021-04-26 DIAGNOSIS — I509 Heart failure, unspecified: Secondary | ICD-10-CM | POA: Diagnosis not present

## 2021-04-26 DIAGNOSIS — I482 Chronic atrial fibrillation, unspecified: Secondary | ICD-10-CM

## 2021-04-26 DIAGNOSIS — R634 Abnormal weight loss: Secondary | ICD-10-CM

## 2021-04-26 DIAGNOSIS — R5383 Other fatigue: Secondary | ICD-10-CM | POA: Diagnosis not present

## 2021-04-26 DIAGNOSIS — T148XXA Other injury of unspecified body region, initial encounter: Secondary | ICD-10-CM | POA: Diagnosis not present

## 2021-04-26 DIAGNOSIS — L89152 Pressure ulcer of sacral region, stage 2: Secondary | ICD-10-CM

## 2021-04-26 NOTE — Progress Notes (Signed)
Location:  [facility] Room Number: 106 Place of Service:  SNF (31)  Provider:   Code Status: DNR Goals of Care:  Advanced Directives 04/26/2021  Does Patient Have a Medical Advance Directive? Yes  Type of Paramedic of [place];Living will;Out of facility DNR (pink MOST or yellow form)  Does patient want to make changes to medical advance directive? No - Patient declined  Copy of [place] in Chart? Yes - validated most recent copy scanned in chart (See row information)  Would patient like information on creating a medical advance directive? -  Pre-existing out of facility DNR order (yellow form or pink MOST form) Yellow form placed in chart (order not valid for inpatient use)     Chief Complaint  Patient presents with   Medical Management of Chronic Issues   Quality Metric Gaps    #4 Covid, Flu shot, #2 Shingrix    HPI: Patient is a 87 y.o. male seen today for medical management of chronic diseases.    Patient is long term resident of SNF Has history of sick sinus syndrome.  Bradycardia History of PAF On Eliquis Chronic CHF CKD stage III, BPH Mild cognitive impairment, hypothyroid  Patient was admitted in the hospital 6/16-6/21 for: Choledocholithiasis s/p ERCP    Recently Patient is not doing well. Sleeping more Worsening Wounds both in Heel and Sacrum. Now seeing Wound Care Nurses said since yesterday he is not eating. Refused his Lunch also Per Nurses sleeps a lot Unable to give me much history Was sleeping. Does wake up and respond but not following any commands. No Fever or chills. 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   Hyperglycemia 01/02/2013   Hypertrophy of prostate without urinary obstruction and other lower urinary tract symptoms (LUTS) 2002   Insomnia with sleep apnea, unspecified 02/2010   Irritable bowel syndrome 11/14/2011   Long term (current) use of anticoagulants 11/2009   Mild cognitive impairment 04/19/2020   Other abnormal blood chemistry 02/09/2009   Other B-complex deficiencies 2008   Other dyspnea and respiratory abnormality 11/2009   Other malaise and fatigue 2007   Pain in joint, lower leg 02/2010   Palpitations 2007   Reflux esophagitis 2007   Sebaceous cyst 2008   Tension headache 2009   Undiagnosed cardiac murmurs 2002   Unspecified constipation 07/04/2012   Unspecified essential hypertension 2007   Unspecified hereditary and idiopathic peripheral neuropathy 02/2010   Unspecified transient cerebral ischemia 2002   Urinary frequency 10/01/2012    Past Surgical History:  Procedure Laterality Date   ENDOSCOPIC RETROGRADE CHOLANGIOPANCREATOGRAPHY (ERCP) WITH PROPOFOL N/A 02/21/2021   Procedure: ENDOSCOPIC RETROGRADE CHOLANGIOPANCREATOGRAPHY (ERCP) WITH PROPOFOL;  Surgeon: Irving Copas., MD;  Location: [facility];  Service: Gastroenterology;  Laterality: N/A;   REMOVAL OF STONES  02/21/2021   Procedure: REMOVAL OF STONES;  Surgeon: Rush Landmark Telford Nab., MD;  Location: [facility];  Service: Gastroenterology;;   Joan Mayans  02/21/2021   Procedure:  SPHINCTEROTOMY;  Surgeon: Irving Copas., MD;  Location: [facility];  Service: Gastroenterology;;    Allergies  Allergen Reactions   Other Other (See Comments)    Peppers. Unknown reaction   Plavix [Clopidogrel Bisulfate] Other (See Comments)    Indigestion, GI upset    Outpatient Encounter  Medications as of 04/26/2021  Medication Sig   acetaminophen (TYLENOL) 500 MG tablet Take 500 mg by mouth at bedtime.   Acetaminophen 500 MG coapsule Take 2 capsules by mouth 2 (two) times daily as needed.   apixaban (ELIQUIS) 2.5 MG TABS tablet Take 1 tablet (2.5 mg total) by mouth 2 (two) times daily. For Afib and stroke prophylaxis   doxazosin (CARDURA) 2 MG tablet Take 1 tablet (2 mg total) by mouth daily.   Emollient (CERAVE DAILY MOISTURIZING) LOTN Apply topically. Apply to legs/feet before compression socks   Eyelid Cleansers (OCUSOFT EYELID CLEANSING) PADS Apply 1 application topically as directed.   fluticasone (FLONASE) 50 MCG/ACT nasal spray Place 2 sprays into both nostrils 2 (two) times daily.   furosemide (LASIX) 20 MG tablet Take 20 mg by mouth.   gabapentin (NEURONTIN) 100 MG capsule Take 100 mg by mouth at bedtime.   Lactase 9000 units CHEW chew 1 tab (9,000 u) by mouth w/ first bite of milk product PRN lactose intolerant.   Levothyroxine Sodium 50 MCG CAPS Take 1 capsule (50 mcg total) by mouth daily before breakfast.   loperamide (IMODIUM A-D) 2 MG tablet Take 2 mg by mouth as needed for diarrhea or loose stools.   Melatonin 3 MG CAPS Take 1 capsule (3 mg total) by mouth at bedtime.   mupirocin ointment (BACTROBAN) 2 % Apply 1 application topically as needed. Apply to left buttock skin tag, no more than 2 times a day until healed   nutrition supplement, JUVEN, (JUVEN) PACK Take 1 packet by mouth 2 (two) times daily between meals.   omeprazole (PRILOSEC OTC) 20 MG tablet Take 20 mg by mouth daily.   phenol (CHLORASEPTIC) 1.4 % LIQD Use as directed 1 spray in the mouth or throat as needed for throat irritation / pain.   Polyvinyl Alcohol-Povidone PF 1.4-0.6 % SOLN Apply 1.4 % to eye 2 (two) times daily.    potassium chloride (KLOR-CON) 10 MEQ tablet Take 10 mEq by mouth daily.   traMADol (ULTRAM) 50 MG tablet Take 25 mg by mouth every 6 (six) hours as needed.   traMADol (ULTRAM)  50 MG tablet Take 50 mg by mouth every 6 (six) hours as needed.   trolamine salicylate (ASPERCREME) 10 % cream Apply 1 application topically 2 (two) times daily as needed. Apply to right shoulder and knees   vitamin B-12 (CYANOCOBALAMIN) 1000 MCG tablet Take 1,000 mcg by mouth daily.   [DISCONTINUED] Amino Acids-Protein Hydrolys (FEEDING SUPPLEMENT, PRO-STAT 64,) LIQD Take 30 mLs by mouth daily with breakfast.   No facility-administered encounter medications on file as of 04/26/2021.    Review of Systems:  Review of Systems  Constitutional:  Positive for activity change, appetite change and unexpected weight change.  HENT: Negative.    Respiratory: Negative.    Cardiovascular: Negative.   Gastrointestinal: Negative.   Genitourinary: Negative.   Musculoskeletal:  Positive for gait problem.  Skin:  Positive for wound.  Neurological:  Positive for weakness.  Psychiatric/Behavioral:  Positive for confusion.    Health Maintenance  Topic Date Due   COVID-19 Vaccine (4 - Booster for Moderna series) 10/18/2020   INFLUENZA VACCINE  04/05/2021  TETANUS/TDAP  02/24/2027   PNA vac Low Risk Adult  Completed   Zoster Vaccines- Shingrix  Completed   HPV VACCINES  Aged Out    Physical Exam: Vitals:   04/26/21 1552  BP: 127/69  Pulse: 85  Resp: 19  Temp: 97.6 F (36.4 C)  SpO2: 90%  Weight: 172 lb 9.6 oz (78.3 kg)  Height: 5\' 9"  (1.753 m)   Body mass index is 25.49 kg/m. Physical Exam Vitals reviewed.  Constitutional:      Comments: Goes to sleep but does respond  HENT:     Nose: Nose normal.     Mouth/Throat:     Mouth: Mucous membranes are dry.     Pharynx: Oropharynx is clear.  Eyes:     Pupils: Pupils are equal, round, and reactive to light.  Cardiovascular:     Rate and Rhythm: Regular rhythm. Bradycardia present.     Pulses: Normal pulses.  Pulmonary:     Effort: Pulmonary effort is normal. No respiratory distress.     Breath sounds: Normal breath sounds. No  wheezing.  Abdominal:     General: Abdomen is flat. Bowel sounds are normal.     Palpations: Abdomen is soft.  Musculoskeletal:        General: No swelling.     Cervical back: Neck supple.  Skin:    General: Skin is warm and dry.  Neurological:     General: No focal deficit present.     Mental Status: He is alert.     Comments: Is more Somnolent today  Psychiatric:        Mood and Affect: Mood normal.    Labs reviewed: Basic Metabolic Panel: Recent Labs    02/18/21 2118 02/19/21 0245 02/20/21 0745 02/21/21 0132 02/22/21 1359 02/23/21 0402 02/26/21 0000 03/02/21 0000 03/15/21 0000  NA 142 139   < > 139 139 142 144 143 140  K 3.8 3.3*   < > 3.6 3.7 3.9 3.8 3.7 3.6  CL 104 102   < > 106 105 107 107 107 105  CO2 27 26   < > 26 29 24  27* 25* 25*  GLUCOSE 108* 97   < > 117* 116* 101*  --   --   --   BUN 51* 46*   < > 36* 33* 33* 34* 39* 35*  CREATININE 2.20* 2.03*   < > 1.75* 1.76* 1.73* 1.5* 1.5* 1.6*  CALCIUM 8.1* 8.2*   < > 8.0* 8.4* 8.5* 8.6* 8.5* 8.5*  MG 2.1 1.8  --   --   --  2.1  --   --   --    < > = values in this interval not displayed.   Liver Function Tests: Recent Labs    02/21/21 0132 02/22/21 1359 02/23/21 0402 02/26/21 0000 03/02/21 0000 03/05/21 0000  AST 39 25 20 18 16 17   ALT 56* 46* 40 27 19 16   ALKPHOS 208* 174* 158* 207* 181* 184*  BILITOT 4.4* 3.3* 2.9*  --   --   --   PROT 5.6* 5.7* 5.5*  --   --   --   ALBUMIN 2.3* 2.3* 2.3* 2.8* 3.0* 3.0*   Recent Labs    02/18/21 2118  LIPASE 56*   Recent Labs    02/19/21 0139  AMMONIA 23   CBC: Recent Labs    09/13/20 0000 09/14/20 0000 02/19/21 0245 02/20/21 0745 02/22/21 1359 02/26/21 0000 03/02/21 0000  WBC 12.1   < >  5.2 6.0 7.2 4.6 5.4  NEUTROABS 10.50  --  4.0  --   --   --   --   HGB 12.8*   < > 11.5* 12.1* 11.4* 11.4* 11.0*  HCT 39*   < > 35.2* 37.2* 37.2* 35* 33*  MCV  --    < > 97.2 96.6 101.6*  --   --   PLT 146*   < > 146* 151 165 195 224   < > = values in this  interval not displayed.   Lipid Panel: No results for input(s): CHOL, HDL, LDLCALC, TRIG, CHOLHDL, LDLDIRECT in the last 8760 hours. Lab Results  Component Value Date   HGBA1C 5.3 03/01/2016    Procedures since last visit: No results found.  Assessment/Plan Pressure injury of coccygeal region Unstageble Seeing Wound care On Dakins per them Deep tissue injury Left Heel Santyl per wound Care Weight loss Very Poor appetite per nurses Has lost 10 lbs since my last visit Will change lasix to 3/week Palliative is already involved ? Transition to Hospice Atrial fibrillation, chronic (HCC) On Eliquis Continues to have Bradycardia Lethargy with Poor Appetite Will change Melatonin to PRN Also order CMP and CBC  Chronic congestive heart failure,  Change Lasix to 3/week  Stage 3b chronic kidney disease (HCC) Repeat CMP  Hypothyroidism TSH normal in 2/21 Will repeat Level as loosign weight   Labs/tests ordered:  CBC,CMP  Next appt:  Visit date not found

## 2021-04-27 ENCOUNTER — Non-Acute Institutional Stay (SKILLED_NURSING_FACILITY): Payer: Medicare Other | Admitting: Orthopedic Surgery

## 2021-04-27 ENCOUNTER — Encounter: Payer: Self-pay | Admitting: Internal Medicine

## 2021-04-27 ENCOUNTER — Encounter: Payer: Self-pay | Admitting: Orthopedic Surgery

## 2021-04-27 DIAGNOSIS — T148XXA Other injury of unspecified body region, initial encounter: Secondary | ICD-10-CM | POA: Diagnosis not present

## 2021-04-27 DIAGNOSIS — Z66 Do not resuscitate: Secondary | ICD-10-CM

## 2021-04-27 DIAGNOSIS — L89154 Pressure ulcer of sacral region, stage 4: Secondary | ICD-10-CM

## 2021-04-27 DIAGNOSIS — R531 Weakness: Secondary | ICD-10-CM | POA: Diagnosis not present

## 2021-04-27 LAB — BASIC METABOLIC PANEL
BUN: 36 — AB (ref 4–21)
CO2: 27 — AB (ref 13–22)
Chloride: 106 (ref 99–108)
Creatinine: 1.3 (ref 0.6–1.3)
Glucose: 105
Potassium: 3.5 (ref 3.4–5.3)
Sodium: 147 (ref 137–147)

## 2021-04-27 LAB — COMPREHENSIVE METABOLIC PANEL
Albumin: 2.5 — AB (ref 3.5–5.0)
Calcium: 8.7 (ref 8.7–10.7)
Globulin: 2.7

## 2021-04-27 LAB — CBC AND DIFFERENTIAL
HCT: 35 — AB (ref 41–53)
Hemoglobin: 11.6 — AB (ref 13.5–17.5)
Platelets: 291 (ref 150–399)
WBC: 13.5

## 2021-04-27 LAB — HEPATIC FUNCTION PANEL
ALT: 9 — AB (ref 10–40)
AST: 13 — AB (ref 14–40)
Alkaline Phosphatase: 125 (ref 25–125)
Bilirubin, Total: 1

## 2021-04-27 LAB — TSH: TSH: 2.51 (ref 0.41–5.90)

## 2021-04-27 LAB — CBC: RBC: 3.74 — AB (ref 3.87–5.11)

## 2021-04-27 MED ORDER — DOXYCYCLINE HYCLATE 100 MG PO TABS: 100.0000 mg | ORAL_TABLET | Freq: Two times a day (BID) | ORAL | 0 refills | Status: AC

## 2021-04-27 NOTE — Progress Notes (Signed)
Location:  [facility] Room Number: 413 Place of Service:  SNF [id number]) Provider:  Windell Moulding, AGNP-C  Virgie Dad, MD  [name] Care Team: Virgie Dad, MD as PCP - General (Internal Medicine) [facility] [name], DMD as Consulting Physician (Oral Surgery)  Extended Emergency Contact Information Primary Emergency Contact: [name] Address: [address]          [name], [place] [address] [name] Phone: [phone] Relation: Daughter Secondary Emergency Contact: [place] Address: [place],  [address] [name] of [place] Phone: [phone] Mobile Phone: [phone] Relation: Son  Code Status:  DNR Goals of care: Advanced Directive information Advanced Directives 04/26/2021  Does [name] Have a Medical Advance Directive? Yes  Type of Paramedic of [place];Living will;Out of facility DNR (pink MOST or yellow form)  Does [name] want to make changes to medical advance directive? No - [name] declined  Copy of [place] in Chart? Yes - validated most recent copy scanned in chart (See row information)  Would [name] like information on creating a medical advance directive? -  Pre-existing out of facility DNR order (yellow form or pink MOST form) Yellow form placed in chart (order not valid for inpatient use)     Chief Complaint  [name] presents with   Acute Visit    fever    HPI:  Pt is a 87 y.o. male seen today for acute visit due to fever.   He currently resides on the skilled nursing unit at [facility] Past medical history includes: aortic valve disorder, atrial fibrillation, CHF, HTN, sick sinus syndrome, choledocholithiasis, GERD, hypothyroidism, BPH, CKD stage 3b, constipation and weakness.   He has been followed by the [facility] for pressure injury to coccyx and heel.  Pressure injury developed during last hospitalization in June 2022. Today, nursing reports low grade fevers in the past few days, reported to be 100.5 08/21. Wound nurse also states changes to wound bed and odor noted. Today, he is easily aroused to voice, remains very HOH but able to follow commands and answer questions.   Recently followed by Palliative care for non-healing wound.    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   Hyperglycemia 01/02/2013   Hypertrophy of prostate without urinary obstruction and other lower urinary tract symptoms (LUTS) 2002   Insomnia with sleep apnea, unspecified 02/2010   Irritable bowel syndrome 11/14/2011   Long term (current) use of anticoagulants 11/2009   Mild cognitive impairment 04/19/2020   Other abnormal blood chemistry 02/09/2009   Other B-complex deficiencies 2008   Other dyspnea and respiratory abnormality 11/2009   Other malaise and fatigue 2007   Pain in joint, lower leg 02/2010   Palpitations 2007   Reflux esophagitis 2007   Sebaceous cyst 2008   Tension headache 2009   Undiagnosed cardiac murmurs 2002   Unspecified constipation 07/04/2012   Unspecified essential hypertension 2007  Unspecified hereditary and idiopathic peripheral neuropathy 02/2010   Unspecified transient cerebral ischemia 2002   Urinary frequency 10/01/2012   Past Surgical History:  Procedure Laterality Date   ENDOSCOPIC RETROGRADE CHOLANGIOPANCREATOGRAPHY (ERCP) WITH PROPOFOL N/A 02/21/2021   Procedure: ENDOSCOPIC RETROGRADE  CHOLANGIOPANCREATOGRAPHY (ERCP) WITH PROPOFOL;  Surgeon: Irving Copas., MD;  Location: [facility];  Service: Gastroenterology;  Laterality: N/A;   REMOVAL OF STONES  02/21/2021   Procedure: REMOVAL OF STONES;  Surgeon: Rush Landmark Telford Nab., MD;  Location: [facility];  Service: Gastroenterology;;   Joan Mayans  02/21/2021   Procedure: Joan Mayans;  Surgeon: Mansouraty, Telford Nab., MD;  Location: [facility] ENDOSCOPY;  Service: Gastroenterology;;    Allergies  Allergen Reactions   Other Other (See Comments)    Peppers. Unknown reaction   Plavix [Clopidogrel Bisulfate] Other (See Comments)    Indigestion, GI upset    Outpatient Encounter Medications as of 04/27/2021  Medication Sig   acetaminophen (TYLENOL) 500 MG tablet Take 500 mg by mouth at bedtime.   Acetaminophen 500 MG coapsule Take 2 capsules by mouth 2 (two) times daily as needed.   apixaban (ELIQUIS) 2.5 MG TABS tablet Take 1 tablet (2.5 mg total) by mouth 2 (two) times daily. For Afib and stroke prophylaxis   doxazosin (CARDURA) 2 MG tablet Take 1 tablet (2 mg total) by mouth daily.   Emollient (CERAVE DAILY MOISTURIZING) LOTN Apply topically. Apply to legs/feet before compression socks   Eyelid Cleansers (OCUSOFT EYELID CLEANSING) PADS Apply 1 application topically as directed.   fluticasone (FLONASE) 50 MCG/ACT nasal spray Place 2 sprays into both nostrils 2 (two) times daily.   furosemide (LASIX) 20 MG tablet Take 20 mg by mouth.   gabapentin (NEURONTIN) 100 MG capsule Take 100 mg by mouth at bedtime.   Lactase 9000 units CHEW chew 1 tab (9,000 u) by mouth w/ first bite of milk product PRN lactose intolerant.   Levothyroxine Sodium 50 MCG CAPS Take 1 capsule (50 mcg total) by mouth daily before breakfast.   loperamide (IMODIUM A-D) 2 MG tablet Take 2 mg by mouth as needed for diarrhea or loose stools.   Melatonin 3 MG CAPS Take 1 capsule (3 mg total) by mouth at bedtime.   mupirocin ointment (BACTROBAN) 2 % Apply  1 application topically as needed. Apply to left buttock skin tag, no more than 2 times a day until healed   nutrition supplement, JUVEN, (JUVEN) PACK Take 1 packet by mouth 2 (two) times daily between meals.   omeprazole (PRILOSEC OTC) 20 MG tablet Take 20 mg by mouth daily.   phenol (CHLORASEPTIC) 1.4 % LIQD Use as directed 1 spray in the mouth or throat as needed for throat irritation / pain.   Polyvinyl Alcohol-Povidone PF 1.4-0.6 % SOLN Apply 1.4 % to eye 2 (two) times daily.    potassium chloride (KLOR-CON) 10 MEQ tablet Take 10 mEq by mouth daily.   traMADol (ULTRAM) 50 MG tablet Take 25 mg by mouth every 6 (six) hours as needed.   traMADol (ULTRAM) 50 MG tablet Take 50 mg by mouth every 6 (six) hours as needed.   trolamine salicylate (ASPERCREME) 10 % cream Apply 1 application topically 2 (two) times daily as needed. Apply to right shoulder and knees   vitamin B-12 (CYANOCOBALAMIN) 1000 MCG tablet Take 1,000 mcg by mouth daily.   No facility-administered encounter medications on file as of 04/27/2021.    Review of Systems  Constitutional:  Positive for fatigue and fever. Negative for activity change, appetite change  and chills.  Respiratory:  Negative for cough, shortness of breath and wheezing.   Cardiovascular:  Negative for chest pain and leg swelling.  Musculoskeletal:  Positive for arthralgias and myalgias.  Skin:  Positive for wound.       Pressure injury to coccyx and heel  Psychiatric/Behavioral:  Positive for confusion. Negative for dysphoric mood. The [name] is not nervous/anxious.    Immunization History  Administered Date(s) Administered   Influenza Inj Mdck Quad Pf 06/23/2016   Influenza, High Dose Seasonal PF 07/04/2019, 06/26/2020, 07/03/2020   Influenza,inj,Quad PF,6+ Mos 07/05/2018   Influenza-Unspecified 06/11/2013, 06/23/2014, 06/25/2015, 06/29/2017, 06/26/2020   Moderna SARS-COV2 Booster Vaccination 07/18/2020   Moderna Sars-Covid-2 Vaccination 09/17/2019,  10/30/2019   Pneumococcal Conjugate-13 09/09/2015   Pneumococcal Polysaccharide-23 09/05/2008   Td 09/05/1978   Tdap 02/23/2017   Zoster Recombinat (Shingrix) 10/02/2017, 03/01/2018   Pertinent  Health Maintenance Due  Topic Date Due   INFLUENZA VACCINE  04/05/2021   PNA vac Low Risk Adult  Completed   Fall Risk  04/19/2020 04/15/2020 04/15/2020 12/11/2019 07/31/2019  Falls in the past year? - 0 0 0 0  Comment - - - - [place] Telephone Survey: data to providers prior to load  Number falls in past yr: - 0 0 0 -  Injury with Fall? - 0 0 0 -  Risk for fall due to : History of fall(s);Impaired balance/gait;Impaired mobility - - - -  Follow up Falls prevention discussed;Education provided;Falls evaluation completed - - - -   Functional Status Survey:    Vitals:   04/27/21 1624  BP: 127/69  Pulse: 90  Resp: 19  Temp: 97.6 F (36.4 C)  SpO2: 90%  Weight: 172 lb 9.6 oz (78.3 kg)   Body mass index is 25.49 kg/m. Physical Exam Vitals reviewed.  Constitutional:      General: He is not in acute distress.    Appearance: He is ill-appearing.  HENT:     Head: Normocephalic.  Cardiovascular:     Rate and Rhythm: Normal rate. Rhythm irregular.     Pulses: Normal pulses.     Heart sounds: Normal heart sounds. No murmur heard. Pulmonary:     Effort: Pulmonary effort is normal. No respiratory distress.     Breath sounds: Normal breath sounds. No wheezing.  Skin:    Comments:  "Left heel noted with small amount of serous drainage. 1 cm boarder of healthy tissue noted with medial surface area of heel dark eschar. +3 pitting edema noted to bilateral lower extremities."  Coccyx wound with slough and eschar present, very small amount of granulation tissue noted. Odor present, drainage appears to be purulent.   Neurological:     General: No focal deficit present.     Mental Status: He is easily aroused. Mental status is at baseline.     Motor: Weakness present.     Gait: Gait abnormal.   Psychiatric:        Mood and Affect: Mood normal.        Behavior: Behavior normal.    Labs reviewed: Recent Labs    02/18/21 2118 02/19/21 0245 02/20/21 0745 02/21/21 0132 02/22/21 1359 02/23/21 0402 02/26/21 0000 03/02/21 0000 03/15/21 0000  NA 142 139   < > 139 139 142 144 143 140  K 3.8 3.3*   < > 3.6 3.7 3.9 3.8 3.7 3.6  CL 104 102   < > 106 105 107 107 107 105  CO2 27 Gary   < > Gary 29  24 27* 25* 25*  GLUCOSE 108* 97   < > 117* 116* 101*  --   --   --   BUN 51* 46*   < > 36* 33* 33* 34* 39* 35*  CREATININE 2.20* 2.03*   < > 1.75* 1.76* 1.73* 1.5* 1.5* 1.6*  CALCIUM 8.1* 8.2*   < > 8.0* 8.4* 8.5* 8.6* 8.5* 8.5*  MG 2.1 1.8  --   --   --  2.1  --   --   --    < > = values in this interval not displayed.   Recent Labs    02/21/21 0132 02/22/21 1359 02/23/21 0402 02/26/21 0000 03/02/21 0000 03/05/21 0000  AST 39 25 20 18 16 17   ALT 56* 46* 40 27 19 16   ALKPHOS 208* 174* 158* 207* 181* 184*  BILITOT 4.4* 3.3* 2.9*  --   --   --   PROT 5.6* 5.7* 5.5*  --   --   --   ALBUMIN 2.3* 2.3* 2.3* 2.8* 3.0* 3.0*   Recent Labs    09/13/20 0000 09/14/20 0000 02/19/21 0245 02/20/21 0745 02/22/21 1359 02/26/21 0000 03/02/21 0000  WBC 12.1   < > 5.2 6.0 7.2 4.6 5.4  NEUTROABS 10.50  --  4.0  --   --   --   --   HGB 12.8*   < > 11.5* 12.1* 11.4* 11.4* 11.0*  HCT 39*   < > 35.2* 37.2* 37.2* 35* 33*  MCV  --    < > 97.2 96.6 101.6*  --   --   PLT 146*   < > 146* 151 165 195 224   < > = values in this interval not displayed.   Lab Results  Component Value Date   TSH 2.59 10/09/2019   Lab Results  Component Value Date   HGBA1C 5.3 03/01/2016   Lab Results  Component Value Date   CHOL 119 03/01/2016   HDL 56 03/01/2016   LDLCALC 52 03/01/2016   TRIG 54 03/01/2016    Significant Diagnostic Results in last 30 days:  No results found.  Assessment/Plan 1. Do not resuscitate  2. Pressure injury of coccygeal region, stage 4 ([place]) - suspect infection to wound  bed - intermittent low grade fevers, increased slough, eschar, and odor to wound - cbc/diff- pending - recommend hospice consult - doxycycline 100 mg po bid x 14 days - increase dressings with dakins to bid - recommend earlier f/u with wound care - he refused air-overlay mattress in past - recommend frequent turning- turn every 4 hours  3. Deep tissue injury - eschar to left heel  - cont dressing changes and heel elevation with boots    Family/ staff Communication: plan discussed with [name] and nurse  Labs/tests ordered:  none

## 2021-04-28 ENCOUNTER — Non-Acute Institutional Stay: Payer: Self-pay

## 2021-04-28 ENCOUNTER — Non-Acute Institutional Stay: Payer: Self-pay | Admitting: *Deleted

## 2021-04-28 ENCOUNTER — Other Ambulatory Visit: Payer: Self-pay

## 2021-04-28 DIAGNOSIS — J302 Other seasonal allergic rhinitis: Secondary | ICD-10-CM | POA: Diagnosis not present

## 2021-04-28 DIAGNOSIS — F0150 Vascular dementia without behavioral disturbance: Secondary | ICD-10-CM | POA: Diagnosis not present

## 2021-04-28 DIAGNOSIS — E039 Hypothyroidism, unspecified: Secondary | ICD-10-CM | POA: Diagnosis not present

## 2021-04-28 DIAGNOSIS — H04129 Dry eye syndrome of unspecified lacrimal gland: Secondary | ICD-10-CM | POA: Diagnosis not present

## 2021-04-28 DIAGNOSIS — I69351 Hemiplegia and hemiparesis following cerebral infarction affecting right dominant side: Secondary | ICD-10-CM | POA: Diagnosis not present

## 2021-04-28 DIAGNOSIS — I110 Hypertensive heart disease with heart failure: Secondary | ICD-10-CM | POA: Diagnosis not present

## 2021-04-28 DIAGNOSIS — N400 Enlarged prostate without lower urinary tract symptoms: Secondary | ICD-10-CM | POA: Diagnosis not present

## 2021-04-28 DIAGNOSIS — K219 Gastro-esophageal reflux disease without esophagitis: Secondary | ICD-10-CM | POA: Diagnosis not present

## 2021-04-28 DIAGNOSIS — G250 Essential tremor: Secondary | ICD-10-CM | POA: Diagnosis not present

## 2021-04-28 DIAGNOSIS — K589 Irritable bowel syndrome without diarrhea: Secondary | ICD-10-CM | POA: Diagnosis not present

## 2021-04-28 DIAGNOSIS — L89600 Pressure ulcer of unspecified heel, unstageable: Secondary | ICD-10-CM | POA: Diagnosis not present

## 2021-04-28 DIAGNOSIS — I69318 Other symptoms and signs involving cognitive functions following cerebral infarction: Secondary | ICD-10-CM | POA: Diagnosis not present

## 2021-04-28 DIAGNOSIS — K449 Diaphragmatic hernia without obstruction or gangrene: Secondary | ICD-10-CM | POA: Diagnosis not present

## 2021-04-28 DIAGNOSIS — K8020 Calculus of gallbladder without cholecystitis without obstruction: Secondary | ICD-10-CM | POA: Diagnosis not present

## 2021-04-28 DIAGNOSIS — E1142 Type 2 diabetes mellitus with diabetic polyneuropathy: Secondary | ICD-10-CM | POA: Diagnosis not present

## 2021-04-28 DIAGNOSIS — I509 Heart failure, unspecified: Secondary | ICD-10-CM | POA: Diagnosis not present

## 2021-04-28 DIAGNOSIS — L89150 Pressure ulcer of sacral region, unstageable: Secondary | ICD-10-CM | POA: Diagnosis not present

## 2021-04-28 DIAGNOSIS — G4733 Obstructive sleep apnea (adult) (pediatric): Secondary | ICD-10-CM | POA: Diagnosis not present

## 2021-04-28 DIAGNOSIS — R634 Abnormal weight loss: Secondary | ICD-10-CM | POA: Diagnosis not present

## 2021-04-28 DIAGNOSIS — I4891 Unspecified atrial fibrillation: Secondary | ICD-10-CM | POA: Diagnosis not present

## 2021-04-28 DIAGNOSIS — I350 Nonrheumatic aortic (valve) stenosis: Secondary | ICD-10-CM | POA: Diagnosis not present

## 2021-05-06 DEATH — deceased

## 2021-05-17 ENCOUNTER — Encounter (HOSPITAL_BASED_OUTPATIENT_CLINIC_OR_DEPARTMENT_OTHER): Payer: Medicare Other | Admitting: Internal Medicine
